# Patient Record
Sex: Male | Born: 1945 | Race: White | Hispanic: No | Marital: Married | Smoking: Former smoker
Health system: Southern US, Community
[De-identification: ages and names within clinical notes are randomized; demographics above are authoritative.]

## PROBLEM LIST (undated history)

## (undated) DIAGNOSIS — I5032 Chronic diastolic (congestive) heart failure: Secondary | ICD-10-CM

## (undated) DIAGNOSIS — R011 Cardiac murmur, unspecified: Secondary | ICD-10-CM

## (undated) DIAGNOSIS — I480 Paroxysmal atrial fibrillation: Secondary | ICD-10-CM

## (undated) DIAGNOSIS — E785 Hyperlipidemia, unspecified: Secondary | ICD-10-CM

## (undated) DIAGNOSIS — I701 Atherosclerosis of renal artery: Secondary | ICD-10-CM

## (undated) DIAGNOSIS — N183 Chronic kidney disease, stage 3 unspecified: Secondary | ICD-10-CM

## (undated) DIAGNOSIS — K567 Ileus, unspecified: Secondary | ICD-10-CM

## (undated) DIAGNOSIS — E118 Type 2 diabetes mellitus with unspecified complications: Secondary | ICD-10-CM

## (undated) DIAGNOSIS — I2510 Atherosclerotic heart disease of native coronary artery without angina pectoris: Secondary | ICD-10-CM

## (undated) DIAGNOSIS — I739 Peripheral vascular disease, unspecified: Secondary | ICD-10-CM

## (undated) DIAGNOSIS — I119 Hypertensive heart disease without heart failure: Secondary | ICD-10-CM

## (undated) HISTORY — DX: Morbid (severe) obesity due to excess calories: E66.01

## (undated) HISTORY — DX: Paroxysmal atrial fibrillation: I48.0

## (undated) HISTORY — DX: Ileus, unspecified: K56.7

## (undated) HISTORY — DX: Hypertensive heart disease without heart failure: I11.9

## (undated) HISTORY — PX: JOINT REPLACEMENT: SHX530

## (undated) HISTORY — DX: Peripheral vascular disease, unspecified: I73.9

## (undated) HISTORY — DX: Cardiac murmur, unspecified: R01.1

## (undated) HISTORY — DX: Atherosclerosis of renal artery: I70.1

---

## 2011-10-31 ENCOUNTER — Ambulatory Visit: Payer: Self-pay | Admitting: Orthopedic Surgery

## 2011-11-25 ENCOUNTER — Ambulatory Visit: Payer: Self-pay | Admitting: Orthopedic Surgery

## 2011-11-25 LAB — URINALYSIS, COMPLETE
Bacteria: NONE SEEN
Bilirubin,UR: NEGATIVE
Blood: NEGATIVE
Glucose,UR: NEGATIVE mg/dL (ref 0–75)
Ketone: NEGATIVE
Protein: 100
RBC,UR: NONE SEEN /HPF (ref 0–5)
Specific Gravity: 1.023 (ref 1.003–1.030)
Squamous Epithelial: NONE SEEN
WBC UR: <1 /HPF (ref 0–5)

## 2011-11-25 LAB — CBC
MCH: 29.6 pg (ref 26.0–34.0)
MCHC: 33.6 g/dL (ref 32.0–36.0)
MCV: 88 fL (ref 80–100)
Platelet: 211 10*3/uL (ref 150–440)
RBC: 4.79 10*6/uL (ref 4.40–5.90)
RDW: 14.6 % — ABNORMAL HIGH (ref 11.5–14.5)
WBC: 11.2 10*3/uL — ABNORMAL HIGH (ref 3.8–10.6)

## 2011-11-25 LAB — PROTIME-INR: INR: 0.9

## 2011-11-25 LAB — BASIC METABOLIC PANEL
Anion Gap: 9 (ref 7–16)
BUN: 26 mg/dL — ABNORMAL HIGH (ref 7–18)
Chloride: 111 mmol/L — ABNORMAL HIGH (ref 98–107)
Creatinine: 1.67 mg/dL — ABNORMAL HIGH (ref 0.60–1.30)
Osmolality: 293 (ref 275–301)
Sodium: 143 mmol/L (ref 136–145)

## 2011-11-25 LAB — MRSA PCR SCREENING

## 2011-11-25 LAB — APTT: Activated PTT: 31.6 secs (ref 23.6–35.9)

## 2011-12-03 ENCOUNTER — Inpatient Hospital Stay: Payer: Self-pay | Admitting: Orthopedic Surgery

## 2011-12-04 LAB — HEMOGLOBIN: HGB: 12.5 g/dL — ABNORMAL LOW (ref 13.0–18.0)

## 2011-12-04 LAB — BASIC METABOLIC PANEL
Anion Gap: 9 (ref 7–16)
BUN: 23 mg/dL — ABNORMAL HIGH (ref 7–18)
Calcium, Total: 8 mg/dL — ABNORMAL LOW (ref 8.5–10.1)
Chloride: 109 mmol/L — ABNORMAL HIGH (ref 98–107)
Co2: 22 mmol/L (ref 21–32)
Creatinine: 1.51 mg/dL — ABNORMAL HIGH (ref 0.60–1.30)
EGFR (African American): 55 — ABNORMAL LOW
EGFR (Non-African Amer.): 47 — ABNORMAL LOW
Glucose: 130 mg/dL — ABNORMAL HIGH (ref 65–99)
Osmolality: 285 (ref 275–301)
Potassium: 4.1 mmol/L (ref 3.5–5.1)
Sodium: 140 mmol/L (ref 136–145)

## 2011-12-04 LAB — PLATELET COUNT: Platelet: 168 10*3/uL (ref 150–440)

## 2012-11-02 ENCOUNTER — Ambulatory Visit: Payer: Self-pay | Admitting: Orthopedic Surgery

## 2012-12-01 ENCOUNTER — Ambulatory Visit: Payer: Self-pay | Admitting: Orthopedic Surgery

## 2012-12-01 LAB — BASIC METABOLIC PANEL
Anion Gap: 5 — ABNORMAL LOW (ref 7–16)
BUN: 24 mg/dL — ABNORMAL HIGH (ref 7–18)
Calcium, Total: 9.3 mg/dL (ref 8.5–10.1)
Chloride: 110 mmol/L — ABNORMAL HIGH (ref 98–107)
Co2: 25 mmol/L (ref 21–32)
Creatinine: 1.76 mg/dL — ABNORMAL HIGH (ref 0.60–1.30)
EGFR (African American): 45 — ABNORMAL LOW
Potassium: 4.3 mmol/L (ref 3.5–5.1)

## 2012-12-01 LAB — URINALYSIS, COMPLETE
Bilirubin,UR: NEGATIVE
Glucose,UR: NEGATIVE mg/dL (ref 0–75)
Leukocyte Esterase: NEGATIVE
Ph: 5 (ref 4.5–8.0)
RBC,UR: NONE SEEN /HPF (ref 0–5)
Specific Gravity: 1.005 (ref 1.003–1.030)
Squamous Epithelial: NONE SEEN
WBC UR: NONE SEEN /HPF (ref 0–5)

## 2012-12-01 LAB — CBC
HCT: 43.7 % (ref 40.0–52.0)
HGB: 14.7 g/dL (ref 13.0–18.0)
MCH: 29 pg (ref 26.0–34.0)
MCHC: 33.6 g/dL (ref 32.0–36.0)
MCV: 87 fL (ref 80–100)
WBC: 11.9 10*3/uL — ABNORMAL HIGH (ref 3.8–10.6)

## 2012-12-01 LAB — SEDIMENTATION RATE: Erythrocyte Sed Rate: 5 mm/hr (ref 0–20)

## 2012-12-01 LAB — APTT: Activated PTT: 31.5 secs (ref 23.6–35.9)

## 2012-12-01 LAB — PROTIME-INR
INR: 0.9
Prothrombin Time: 12.7 secs (ref 11.5–14.7)

## 2012-12-15 ENCOUNTER — Inpatient Hospital Stay: Payer: Self-pay | Admitting: Orthopedic Surgery

## 2012-12-16 LAB — BASIC METABOLIC PANEL
Anion Gap: 5 — ABNORMAL LOW (ref 7–16)
BUN: 24 mg/dL — ABNORMAL HIGH (ref 7–18)
Calcium, Total: 8.2 mg/dL — ABNORMAL LOW (ref 8.5–10.1)
Chloride: 108 mmol/L — ABNORMAL HIGH (ref 98–107)
Co2: 24 mmol/L (ref 21–32)
EGFR (Non-African Amer.): 40 — ABNORMAL LOW
Osmolality: 280 (ref 275–301)
Potassium: 4.2 mmol/L (ref 3.5–5.1)
Sodium: 137 mmol/L (ref 136–145)

## 2012-12-16 LAB — HEMOGLOBIN: HGB: 13 g/dL (ref 13.0–18.0)

## 2012-12-16 LAB — PLATELET COUNT: Platelet: 165 10*3/uL (ref 150–440)

## 2012-12-17 LAB — BASIC METABOLIC PANEL
Anion Gap: 5 — ABNORMAL LOW (ref 7–16)
BUN: 20 mg/dL — ABNORMAL HIGH (ref 7–18)
Chloride: 109 mmol/L — ABNORMAL HIGH (ref 98–107)
Creatinine: 1.77 mg/dL — ABNORMAL HIGH (ref 0.60–1.30)
EGFR (African American): 45 — ABNORMAL LOW
EGFR (Non-African Amer.): 39 — ABNORMAL LOW
Osmolality: 281 (ref 275–301)
Potassium: 3.8 mmol/L (ref 3.5–5.1)

## 2012-12-17 LAB — PATHOLOGY REPORT

## 2013-03-05 ENCOUNTER — Ambulatory Visit: Payer: Self-pay | Admitting: Orthopedic Surgery

## 2014-05-11 IMAGING — CT CT OF THE RIGHT KNEE WITHOUT CONTRAST
1 of 3 series · 8 of 14 positions shown, 10 images · non-contrast
Comparison: none

REASON FOR EXAM: my knee  RT KNEE PAIN PREOP PLANNING
COMMENTS:

[Series 3: knee · axial · 0.39mm/px · z∈[-46,+168]mm · 8 of 553 slices shown, 10 images]
[im 62/553  soft-tissue]
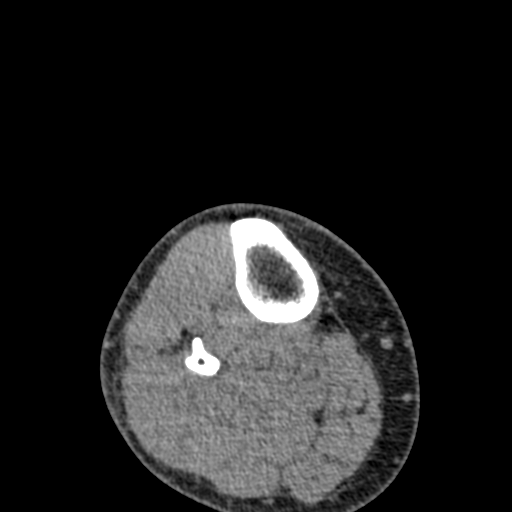
[im 62/553  bone]
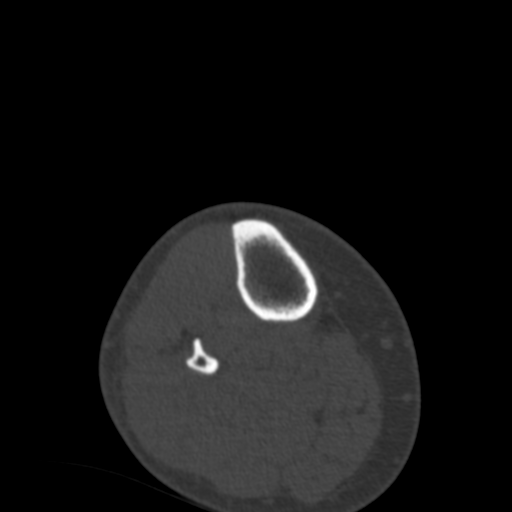
[im 123/553  bone]
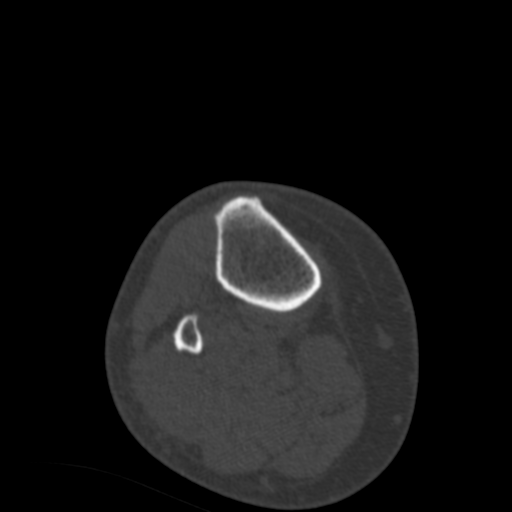
[im 185/553  bone]
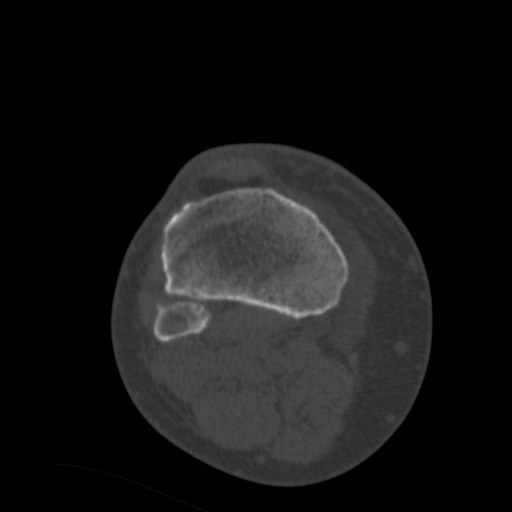
[im 246/553  bone]
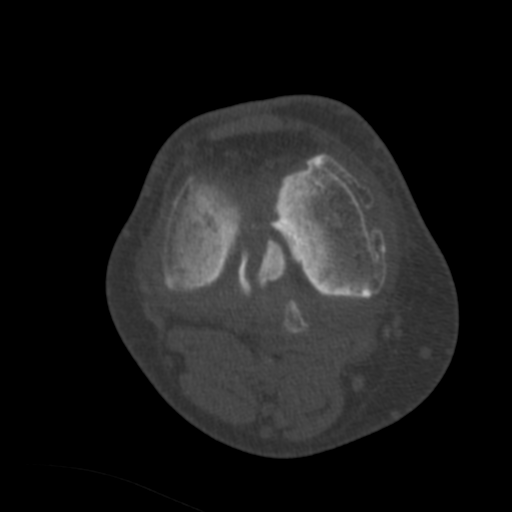
[im 307/553  soft-tissue]
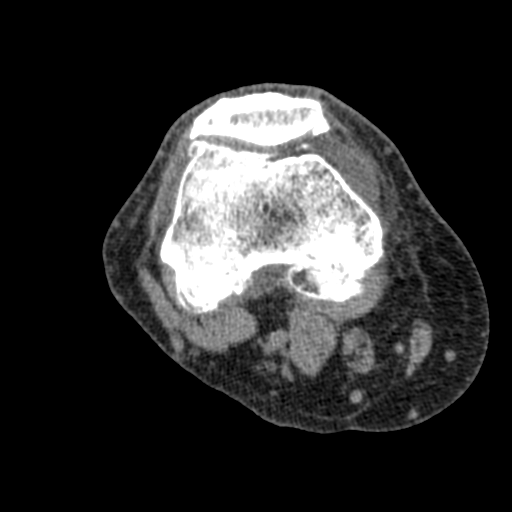
[im 307/553  bone]
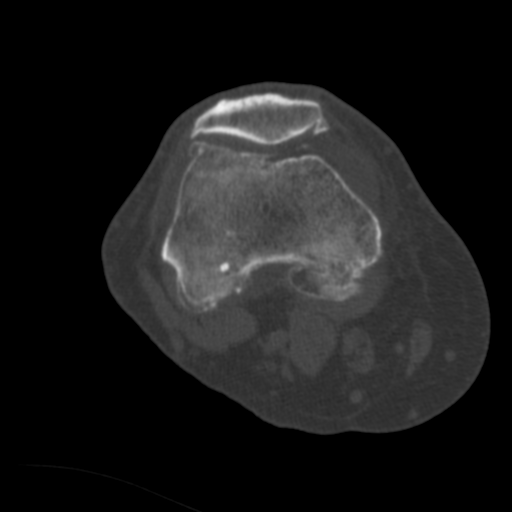
[im 369/553  bone]
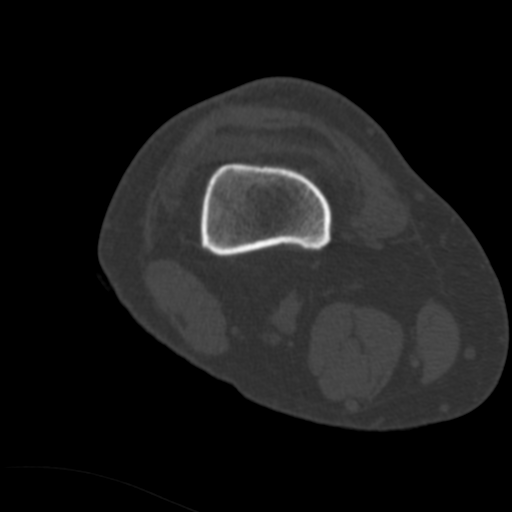
[im 430/553  bone]
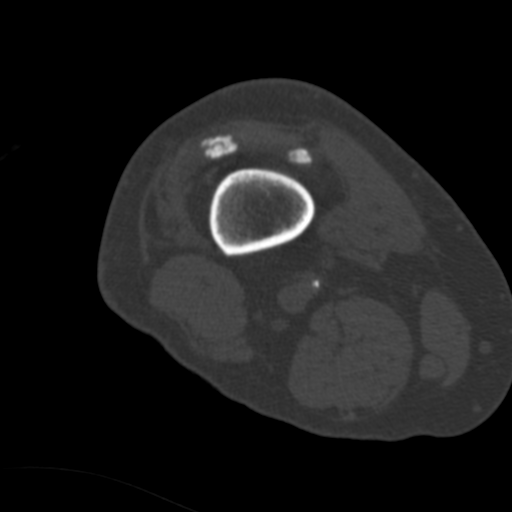
[im 491/553  bone]
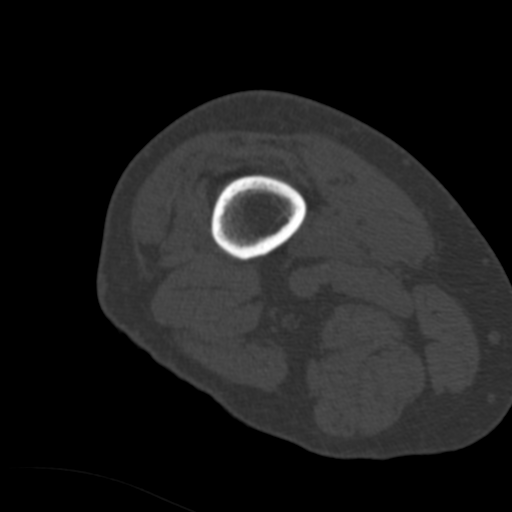

[8 of 14 positions shown; findings below may reference images not displayed]

PROCEDURE:     CT  - CT KNEE RIGHT WO  - October 31, 2011  [DATE]

RESULT:     Multislice helical acquisition through the right hip, right knee
and right ankle are obtained prior to right knee arthroplasty. Images are
reconstructed at 2.0 mm slice thickness in the axial plane at bone window
settings through the right hip and right ankle and at 1 mm slice thickness
through the right knee.

There are severe degenerative changes in the right knee with joint space
narrowing, some calcification in the patellar retinaculum and degenerative
changes of osteoarthritis in the joint and margins of the bony structures
about the joint.

Images of the right hip show no bony destruction, fracture or dislocation.
Images through the right foot and ankle show no significant bony abnormality.
IMPRESSION: Degenerative changes about the right knee.

[REDACTED]

## 2014-07-05 NOTE — Op Note (Signed)
PATIENT NAME:  James ChimesUNDERWOOD, Liston MR#:  [id number] DATE OF BIRTH:  [birth year]  DATE OF PROCEDURE:  12/03/2011  PREOPERATIVE DIAGNOSIS: Right knee severe osteoarthritis.  POSTOPERATIVE DIAGNOSIS: Right knee severe osteoarthritis.   PROCEDURE: Right total knee replacement.   ANESTHESIA: Spinal.   SURGEON: Leitha SchullerMichael J. Chibuikem Thang, MD   ASSISTANT: Amador Cunashomas Gaines, PA-C   ASSISTANT: April Burns, Nurse Practitioner  DESCRIPTION OF PROCEDURE: The patient was brought to the operating room, and after adequate anesthesia was obtained the right leg was prepped and draped in sterile fashion and a tourniquet applied to the upper thigh. An Alvarado legholder was also utilized. After appropriate patient identification and time out procedures were completed, and having prepped and draped in the usual sterile manner, the leg was exsanguinated with an Esmarch and the tourniquet raised to 300 mmHg. A midline skin incision was made, followed by a medial parapatellar arthrotomy. Inspection revealed multiple loose bodies in the suprapatellar pouch and eburnated bone in the medial compartment on both femoral and tibial sides, several millimeters of bone loss. The lateral compartment was relatively spared, and the patellofemoral joint had extensive erosive changes, as did the trochlea. The ACL and suprapatellar fat pad were excised and the proximal tibia exposed to allow for application of the proximal tibial cutting block. The proximal tibial cut was carried out and the bone removed while the anterior horns of the medial and lateral meniscus, the residual horns, were later resected after removing the femoral cuts. A  femoral cutting block was applied and drill holes placed to hold it in position. The distal femoral cut was carried out, a 5 cutting block applied, and anterior and posterior chamfer cuts made without any notching. Trial components were then placed with a 4 tibia, 5 femur, and a 10 mm insert. There was a little bit of  laxity in mid flexion and extension, so the 12 mm insert was placed and this gave excellent stability in mid flexion and extension. The notch cut was made into the proximal tibia for the keel, and the distal femoral drill holes were at this time along with the notch and the trochlear groove . The   patella was cut with the guide, it measured a size 3 after drill holes were made. All the bony surfaces were thoroughly irrigated and dried. The cement was inserted and the tibial components cemented into place first with excess cement removed, followed by the 12 mm standard polyethylene insert, a 5 femur, a 3 patella. After the cement had set, excess cement was removed and the knee thoroughly irrigated. The tourniquet was let down. The wound was closed with a heavy quill suture for the capsule, 2-0 quill subcutaneously and skin staples. Xeroform, 4 x 4's, ABD, Webril, Polar Care and base were applied, along with a knee immobilizer and transferred to recovery room in stable  condition.   ESTIMATED BLOOD LOSS: 100 mL.   COMPLICATIONS: None.  TOURNIQUET TIME: 72 minutes at 300 mmHg.   IMPLANTS: Medacta GMK Primary total knee system, size 5 standard femur, size 4 fixed  tibial baseplate with a 12 mm standard insert, and a size 3 patella. All components were cemented.    COMPLICATIONS: None.   CONDITION: Condition to the recovery room  was stable.  ____________________________ Leitha SchullerMichael J. Dezirea Mccollister, MD mjm:cbb D: 12/03/2011 10:26:30 ET T: 12/03/2011 11:26:17 ET JOB#: [id number]  cc: Leitha SchullerMichael J. Bertice Risse, MD, <Dictator> Leitha SchullerMICHAEL J Quinita Kostelecky MD ELECTRONICALLY SIGNED 12/03/2011 12:46

## 2014-07-05 NOTE — Discharge Summary (Signed)
PATIENT NAME:  James Clayton, PAFF MR#:  [id number] DATE OF BIRTH:  January 17, 1946  DATE OF ADMISSION:  12/03/2011 DATE OF DISCHARGE:  12/06/2011  ADMITTING DIAGNOSIS: Right knee severe osteoarthritis.   DISCHARGE DIAGNOSIS: Right knee severe osteoarthritis.   PROCEDURE: Right total knee replacement.   ANESTHESIA: Spinal.   SURGEON: Leitha Schuller, M.D.   ASSISTANT: Amador Cunas, PA-C   ASSISTANT:  April Burns, NP   ESTIMATED BLOOD LOSS: 100 mL.   COMPLICATIONS: None.   TOURNIQUET TIME: 72 minutes at 300 mmHg.  IMPLANTS:  Medacta GMK primary total knee system, size five standard femur, size four fixed tibial base plate with a 04-VW standard insert, and a size three patella. All components were cemented.   COMPLICATIONS: None.   CONDITION: To recovery room stable.   HISTORY: The patient is a 69 year old [place], [place]  previously had seen. He has severe osteoarthritis of the right knee, loose bodies, complete loss of the medial and patellofemoral joint space. He has lived with this for many years and has exhausted nonoperative treatment including corticosteroid injections and Synvisc injections. He has debilitating pain and cannot get through a grocery store because of this. It has markedly limited his activities.   PHYSICAL EXAMINATION: HEAD: Remarkable for upper dentures, edentulous with an upper plate. LUNGS: Clear to auscultation. HEART: Regular rate and rhythm. NECK: Normal. EXTREMITIES: Knee- He has varus deformity that is passively correctable. Range of motion is 5 to 115 degrees with marked crepitation and minimal swelling. He does have some trace edema in both lower extremities. He is able to flex and extend the toes. He has intact sensation and palpable dorsalis pedis pulse.   HOSPITAL COURSE: The patient was admitted to the hospital on 12/03/2011. He had surgery that same day and was brought to the orthopedic floor from the PAC-U in stable condition. The patient's labs and  vital signs were monitored and he was given physical therapy twice daily. The patient's labs and vital signs remained stable and the patient progressed well with physical therapy. On 12/06/2011 the patient was stable and ready for discharge home with Home Health physical therapy.   CONDITION AT DISCHARGE: Stable.   DISCHARGE INSTRUCTIONS:  1. The patient may gradually increase weight-bearing on the affected extremity.  2. Elevate the affected foot and leg on 1 or 2 pillows with the foot higher than the knee.  3. Knee-high TED hose on both legs and remove at bedtime. Replace when arising the next morning.  4. The patient may resume a regular diet as tolerated.  5. Xarelto 10 mg every morning until finished.  6. Pain medications:  Tylenol 650 to 1000 mg every six hours as needed for pain. Oxycodone 5 to 10 mg every four hours as needed for pain.   WOUND CARE: Continue using Polar Care unit maintaining temperature between 40 and 50 degrees. Do not get the dressing  bandage wet or dirty. Call [facility] orthopedics if the dressing gets water under it. Leave the dressing on.   SYMPTOMS TO REPORT: Call [facility] orthopedics if any of the following occur: Bright red bleeding from the incision wound, fever above 101.5 degrees, redness, swelling, or drainage at the incision. Call [facility] orthopedics if you experience any increased leg pain, numbness or weakness in your legs, or bowel or bladder symptoms.   REFERRALS: Home physical therapy has been arranged for continuation of rehab program. Please call [facility] orthopedics if a therapist has not contacted you within 48 hours of  your return home.   FOLLOWUP APPOINTMENT: Call [facility] for follow-up appointment.  The patient needs to be seen in two weeks.   DISCHARGE MEDICATIONS:  1. Amlodipine 10 mg oral tablet 1 tablet oral once daily.  2. Losartan 50 mg oral tablet 1 tablet orally 2 times a day.   3. PreserVision oral tablet 1 tablet orally once a day.  4. Vicodin 500/7.5 mg oral tablet 1 tablet orally 3 times a day as needed.  5. Allopurinol 100 mg oral tablet 2 tabs orally once a day.  6. Fish oil 1000 mg oral capsule one cap orally 2 times a day.  7. Atorvastatin 20 mg oral tablet 1 tablet orally once a day at bedtime.  8. Tradjenta 5 mg oral tablet 1 tablet orally once a day at the evening meal.    ____________________________ Evon Slackhomas C. Ridgely Anastacio, PA-C tcg:bjt D: 12/10/2011 13:19:25 ET T: 12/12/2011 11:00:43 ET JOB#: [id number]  cc: Evon Slackhomas C. Lutie Pickler, PA-C, <Dictator> Evon SlackHOMAS C Zaidy Absher [place]PA ELECTRONICALLY SIGNED 12/17/2011 13:49

## 2014-07-08 NOTE — Discharge Summary (Signed)
PATIENT NAME:  James ChimesUNDERWOOD, Jie MR#:  [id number] DATE OF BIRTH:  1945/07/05  DATE OF ADMISSION:  12/15/2012 DATE OF DISCHARGE:  12/18/2012   ADMITTING DIAGNOSIS: Left knee degenerative osteoarthritis.   DISCHARGE DIAGNOSIS: Left knee degenerative osteoarthritis.   PROCEDURE: Left total knee replacement.   ANESTHESIA: Spinal.   SURGEON: Kennedy BuckerMichael Menz, MD  ASSISTANT: [place]ston Neighborhris Gaines, PA-C   ESTIMATED BLOOD LOSS: 50 mL.   COMPLICATIONS: None.   SPECIMEN: Cut ends of bone.   TOURNIQUET TIME: 54 minutes at 300 mmHg.   IMPLANTS:  Medacta GMK Sphere left 5 cemented, left fixed 4 tibial component with a size 4 left 10 mm flex component and a 3 patella, all components cemented.   CONDITION: To recovery room stable.   HISTORY: The patient is a 69 year old who underwent a successful right total knee replacement on 12/03/2011. He was doing well and denies any complaints. He has been pleased with the right surgery. The left knee has been aggravating him and causing him pain and discomfort. It has been giving out on him. The patient had a recent My knee CT of his left knee that showed significant degenerative arthritis. He continues to have moderate to severe pain in the medial aspect of the left knee despite cortisone injections as well as Synvisc injections and oral anti-inflammatory medications. The patient is taking hydrocodone 7.5/325 mg tablets for his left knee pain.   PHYSICAL EXAMINATION:  GENERAL: Well developed, well-nourished male in no apparent distress. Normal affect. Normal gait with mild antalgic component of left lower extremity.  HEENT: Head normocephalic, atraumatic. Pupils equal, round and reactive to light.  HEART: Regular rate and rhythm. LUNGS: Clear to auscultation bilaterally. No wheezing, rales or rhonchi.  EXTREMITIES: Right knee: The patient has full range of motion with flexion and extension of 0 to 120 degrees. There is no laxity with varus or valgus stress testing. He  has a negative Homans sign. Incision is completely healed. He has no tenderness on exam. Left knee shows the patient has mild swelling. No warmth, erythema or effusion noted. He has 0 to 115 degrees range of motion. There is no laxity on exam. He has a negative Homans sign.   HOSPITAL COURSE: The patient was admitted to the hospital on 12/15/2012. He had surgery that same day and was brought to the orthopedic floor from the PACU in stable condition. On postoperative day 1, the patient's vital signs and labs were stable. He had good progress with physical therapy, and throughout postoperative day 2 and postoperative day 3, the patient continued to progress very well with physical therapy, and by postoperative day 3, he was ambulating 200 feet as well as able to do stairs. The patient had a bowel movement and was stable and ready for discharge on 12/18/2012.   CONDITION AT DISCHARGE: Stable.   DISCHARGE INSTRUCTIONS:  1. The patient may gradually increase weight-bearing on the affected extremity.  2. He needs to elevate the affected foot or leg on 1 or 2 pillows with the foot higher than the knee.  3. Thigh-high TED hose on both legs and remove at bedtime. Replace when arising the next morning.  4. Elevate the heels off the bed.  5. Use incentive spirometry every hour while awake. Encourage cough and deep breathing.  6. No concentrated sweets or sugar.  7. Continue using Polar Care unit, maintaining a temperature between 40 and 50 degrees. 8. Do not get the dressing or bandage wet or dirty. Call [facility] if the  dressing gets water under it. Leave the dressing on.  9. Call [facility] if any of the following occur: Bright red bleeding from the incision or wound, fever above 101.5 degrees, redness, swelling or drainage at the incision. Call [facility] if you experience any increased leg pain, numbness or weakness in your legs or bowel or bladder symptoms.  10. Home physical therapy has been  arranged for continuation of rehab program. Please call [facility] if a therapist has not contacted you within 48 hours of your return home.  11. The patient has a followup appointment with [facility] in 2 weeks.   DISCHARGE MEDICATIONS:  1. Losartan 50 mg 1 tablet orally 2 times a day.  2. Allopurinol 100 mg tablet 2 tabs orally once a day.  3. Atorvastatin 20 mg oral tablet 1 tablet orally once day at bedtime.  4. Glipizide extended-release 2.5 mg oral tablet extended release 1 tablet orally once a day. 5. Hytrin 1 tablet orally once a day at bedtime. 6. Lasix 20 mg oral tablet 1 tablet orally once a day as needed.  7. Amlodipine 5 mg oral tablet 1 tablet orally 2 times a day.  8. Aspirin 81 mg 1 tablet orally once a day. 9. Tylenol 500 mg 1 tablet orally every 4 hours as needed for pain or temperature greater than 100.4.  10. Oxycodone 5 mg oral tablet 1 tablet orally every 4 hours as needed for pain. 11. Magnesium hydroxide 8% oral suspension 30 mL orally 2 times a day as needed for constipation. 12. Xarelto 10 mg oral tablet 1 tablet orally once a day in the morning for 9 days.  13. Bisacodyl 10 mg rectal suppository 1 suppository rectally once a day as needed for constipation.   ____________________________ T. [place] Neighbor, PA-C tcg:OSi D: 12/18/2012 08:12:32 ET T: 12/18/2012 08:28:06 ET JOB#: [id number]  cc: T. [place] Neighbor, PA-C, <Dictator> Evon Slack [place] ELECTRONICALLY SIGNED 12/23/2012 9:05

## 2014-07-08 NOTE — Op Note (Signed)
PATIENT NAME:  James Clayton, James Clayton MR#:  [id number] DATE OF BIRTH:  [birth year]  DATE OF PROCEDURE:  12/15/2012  PREOPERATIVE DIAGNOSIS: Left knee degenerative osteoarthritis.   POSTOPERATIVE DIAGNOSIS:  Left knee degenerative osteoarthritis.  PROCEDURE: Left total knee replacement.   ANESTHESIA: Spinal.   SURGEON: Kennedy Bucker, M.D.   ASSISTANT:  [place] Neighbor, PA-C.   DESCRIPTION OF PROCEDURE: The patient was brought to the operating room and after adequate anesthesia was obtained, the left leg was prepped and draped in sterile fashion with a tourniquet applied to the upper thigh. After patient identification, timeout procedures were completed, the leg was exsanguinated with the use of an Esmarch and the tourniquet raised to 300 mmHg. A midline skin incision was made followed by a medial parapatellar arthrotomy. Inspection revealed sclerotic bone on both tibial and femoral sides on the medial compartment as well as severe degenerative changes with bone exposed on the patella. The lateral compartment had extensive wear in the weight-bearing portion of the more medial aspect of the condyle on the femoral side. The ACL and fat pad were excised and the proximal tibia is exposed to allow for application of the Medacta cutting block. The tibial cutting block was placed and alignment checked. The proximal tibia cut was then carried out with the bone removed and it matched the preop template. Next, the femur was exposed in a similar fashion, removing articular cartilage to get the cutting block to the appropriate position then the distal cut was then made. The size 5 cutting block was applied. Anterior, posterior and chamfer cuts created.  Trial components were placed with a 4 tibia and a 5 femur and 10 mm insert. This gave full extension and excellent stability through range of motion. The distal femoral drill holes were made and the distal femur femoral component removed. The cutting guide for the notch cut for  the trochlear groove was then placed and this cut was carried out. Prior to removal of the trial, the patella was noted to track very well. The patella was addressed at this point, with the use of the cutting guide. A 10 mm resection off the patella was performed, drill guide placed and 3 drill holes made for the resurfacing implant.  A size 3 patella gave good coverage. All trials were removed at this point, The PCL had been removed with the proximal tibia and residual menisci also removed. Tourniquet was let down to check for hemostasis and the joint was infiltrated with local anesthetic, 0.5% Sensorcaine and morphine in the periarticular tissue along with Exparel diluted with saline. Following this, the tourniquet was raised again. The bony surfaces thoroughly irrigated and dried. The tibial component was cemented into place first, followed by the femoral component with the tibial polyethylene insert placed and facet screw also tightened. The knee is held in extension and the patellar button was clamped into place with excess cement being removed. After the cement had set, excess cement in the notch was removed and the knee thoroughly irrigated. The patella tracked well with no touch technique. The tourniquet was let down and the arthrotomy repaired using a heavy quill suture, 2-0 Vicryl subcutaneously and skin staples. Xeroform, 4 x 4's, ABD, Webril, Polar Care and Ace wrap applied. The patient was sent to the recovery room in stable condition.   ESTIMATED BLOOD LOSS: 50 mL.   COMPLICATIONS: None.   SPECIMEN: Cut ends of bone.   TOURNIQUET TIME: 54 minutes at 300 mmHg.   IMPLANTS: Medacta GMK sphere left 5  cemented, left fixed 4 tibial component with a size 4 left 10 mm flex component and a 3 patella. All components cemented.   CONDITION: To recovery room stable.    ____________________________ Leitha SchullerMichael J. Galya Dunnigan, MD mjm:dp D: 12/15/2012 15:16:53 ET T: 12/15/2012 15:55:41  ET JOB#: [id number]  cc: Leitha SchullerMichael J. Cheryl Stabenow, MD, <Dictator> Leitha SchullerMICHAEL J Fredric Slabach MD ELECTRONICALLY SIGNED 12/15/2012 17:25

## 2014-07-14 ENCOUNTER — Encounter (HOSPITAL_COMMUNITY): Payer: Self-pay

## 2015-05-14 IMAGING — CT CT OF THE LEFT KNEE WITHOUT CONTRAST
1 of 3 series · 8 of 14 positions shown, 10 images · non-contrast
Comparison: none

REASON FOR EXAM: MY KNEE  surgery planning lt knee pain
COMMENTS:

[Series 3: knee (id) smooth · axial · 0.39mm/px · z∈[+432,+644]mm · 8 of 545 slices shown, 10 images]
[im 61/545  soft-tissue]
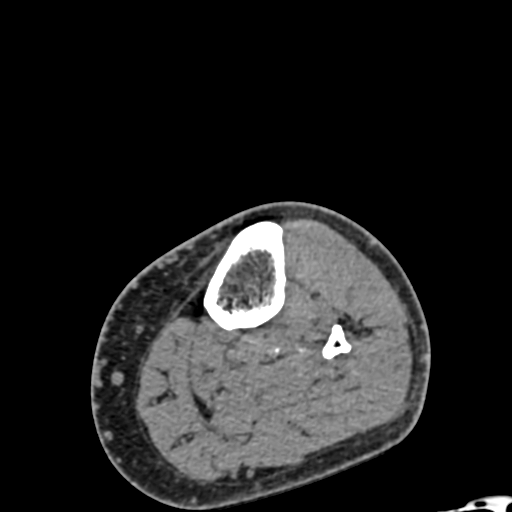
[im 61/545  bone]
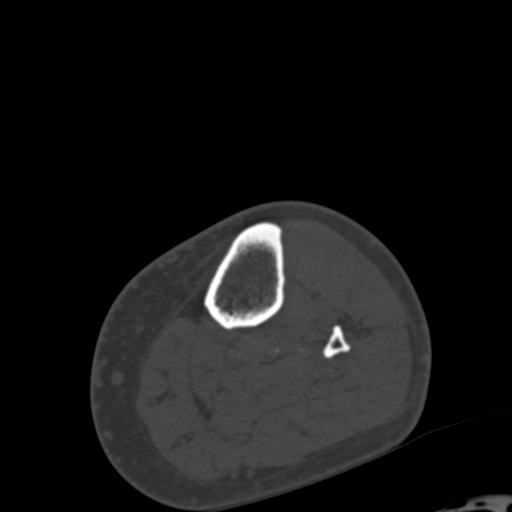
[im 121/545  bone]
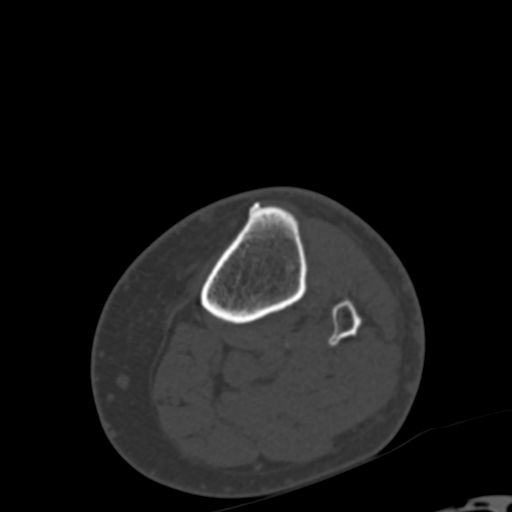
[im 182/545  bone]
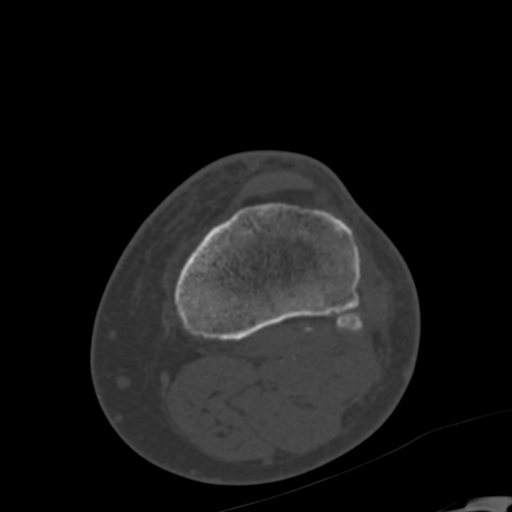
[im 242/545  bone]
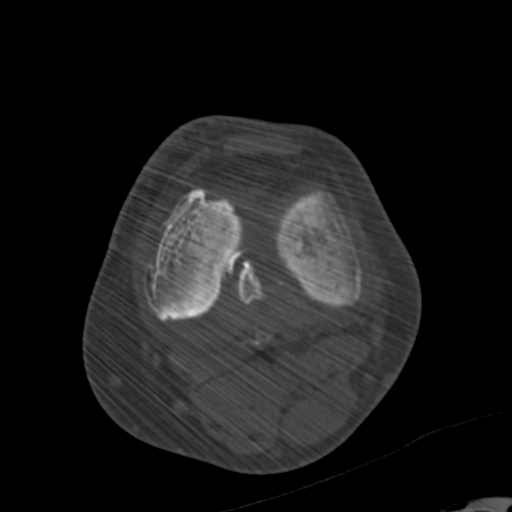
[im 303/545  soft-tissue]
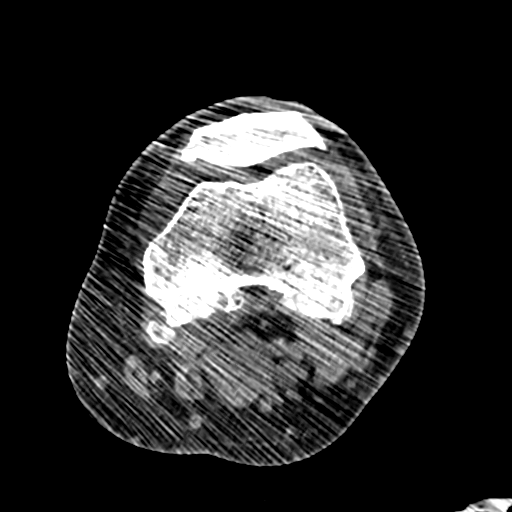
[im 303/545  bone]
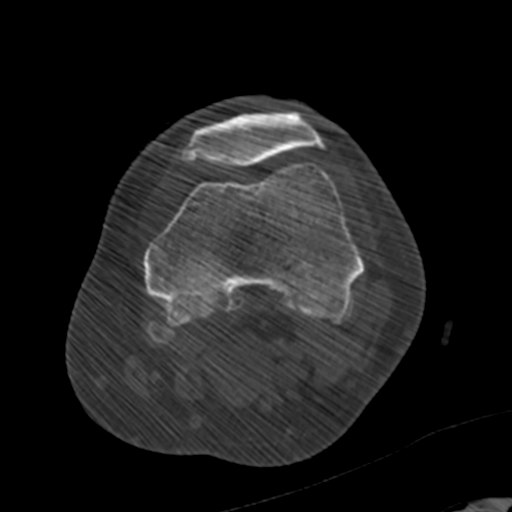
[im 363/545  bone]
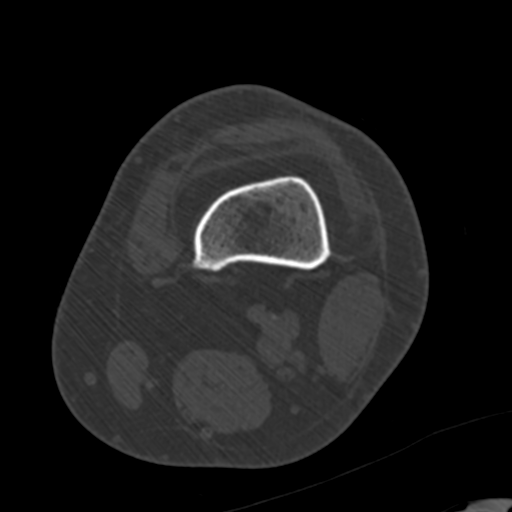
[im 424/545  bone]
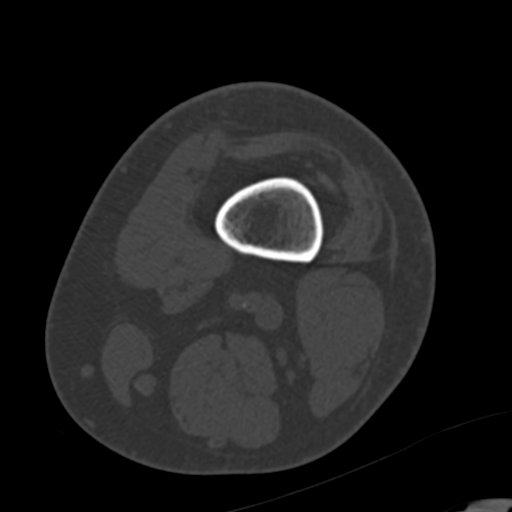
[im 484/545  bone]
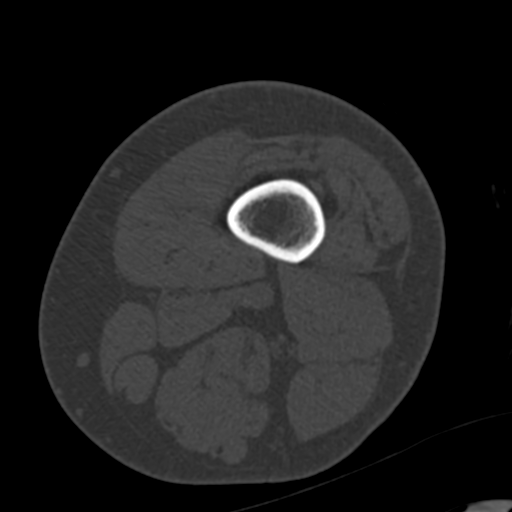

[8 of 14 positions shown; findings below may reference images not displayed]

PROCEDURE:     CT  - CT KNEE LEFT WO  - November 02, 2012  [DATE]

RESULT:     Multislice helical acquisition with bone window reconstructions
in the axial plane is performed through the left hip, left knee and left
ankle. Ankle and hip images are reconstructed at 2 mm slice thickness while
the knee images are reconstructed at 1 mm slice thickness.

There is significant medial compartmental narrowing with subchondral
sclerosis. No fracture or bony mass is evident. Patellofemoral degenerative
changes are present.

Images through the left hip show no fracture, dislocation or focal bony
lesion. The left ankle likewise shows some chronic degenerative change
without acute bony abnormality. Intertarsal degenerative changes are also
present.
IMPRESSION: Multifocal degenerative changes as described.

[REDACTED]

## 2015-12-22 DIAGNOSIS — Z87891 Personal history of nicotine dependence: Secondary | ICD-10-CM | POA: Insufficient documentation

## 2015-12-22 DIAGNOSIS — K219 Gastro-esophageal reflux disease without esophagitis: Secondary | ICD-10-CM | POA: Insufficient documentation

## 2015-12-22 DIAGNOSIS — M109 Gout, unspecified: Secondary | ICD-10-CM | POA: Insufficient documentation

## 2015-12-22 DIAGNOSIS — M179 Osteoarthritis of knee, unspecified: Secondary | ICD-10-CM | POA: Insufficient documentation

## 2015-12-22 DIAGNOSIS — N529 Male erectile dysfunction, unspecified: Secondary | ICD-10-CM | POA: Insufficient documentation

## 2015-12-22 DIAGNOSIS — M1710 Unilateral primary osteoarthritis, unspecified knee: Secondary | ICD-10-CM | POA: Insufficient documentation

## 2015-12-22 DIAGNOSIS — R361 Hematospermia: Secondary | ICD-10-CM | POA: Insufficient documentation

## 2015-12-22 DIAGNOSIS — M2140 Flat foot [pes planus] (acquired), unspecified foot: Secondary | ICD-10-CM | POA: Insufficient documentation

## 2015-12-22 DIAGNOSIS — M79676 Pain in unspecified toe(s): Secondary | ICD-10-CM | POA: Insufficient documentation

## 2015-12-22 DIAGNOSIS — E7800 Pure hypercholesterolemia, unspecified: Secondary | ICD-10-CM | POA: Insufficient documentation

## 2016-02-09 ENCOUNTER — Inpatient Hospital Stay
Admission: EM | Admit: 2016-02-09 | Discharge: 2016-02-12 | DRG: 281 | Disposition: A | Discharge status: Other Acute Inpatient Hospital | Payer: Medicare Other | Attending: Internal Medicine | Admitting: Internal Medicine

## 2016-02-09 ENCOUNTER — Encounter: Payer: Self-pay | Admitting: Emergency Medicine

## 2016-02-09 ENCOUNTER — Emergency Department: Payer: Medicare Other

## 2016-02-09 DIAGNOSIS — Z7984 Long term (current) use of oral hypoglycemic drugs: Secondary | ICD-10-CM | POA: Diagnosis not present

## 2016-02-09 DIAGNOSIS — I2510 Atherosclerotic heart disease of native coronary artery without angina pectoris: Secondary | ICD-10-CM | POA: Diagnosis present

## 2016-02-09 DIAGNOSIS — I10 Essential (primary) hypertension: Secondary | ICD-10-CM | POA: Diagnosis present

## 2016-02-09 DIAGNOSIS — Z87891 Personal history of nicotine dependence: Secondary | ICD-10-CM | POA: Diagnosis not present

## 2016-02-09 DIAGNOSIS — E785 Hyperlipidemia, unspecified: Secondary | ICD-10-CM | POA: Diagnosis present

## 2016-02-09 DIAGNOSIS — E669 Obesity, unspecified: Secondary | ICD-10-CM | POA: Diagnosis present

## 2016-02-09 DIAGNOSIS — Z951 Presence of aortocoronary bypass graft: Secondary | ICD-10-CM

## 2016-02-09 DIAGNOSIS — I5032 Chronic diastolic (congestive) heart failure: Secondary | ICD-10-CM | POA: Diagnosis present

## 2016-02-09 DIAGNOSIS — I25110 Atherosclerotic heart disease of native coronary artery with unstable angina pectoris: Secondary | ICD-10-CM | POA: Diagnosis not present

## 2016-02-09 DIAGNOSIS — I130 Hypertensive heart and chronic kidney disease with heart failure and stage 1 through stage 4 chronic kidney disease, or unspecified chronic kidney disease: Secondary | ICD-10-CM | POA: Diagnosis present

## 2016-02-09 DIAGNOSIS — Z01810 Encounter for preprocedural cardiovascular examination: Secondary | ICD-10-CM | POA: Diagnosis not present

## 2016-02-09 DIAGNOSIS — Z7982 Long term (current) use of aspirin: Secondary | ICD-10-CM

## 2016-02-09 DIAGNOSIS — R079 Chest pain, unspecified: Secondary | ICD-10-CM

## 2016-02-09 DIAGNOSIS — I214 Non-ST elevation (NSTEMI) myocardial infarction: Principal | ICD-10-CM | POA: Diagnosis present

## 2016-02-09 DIAGNOSIS — E1122 Type 2 diabetes mellitus with diabetic chronic kidney disease: Secondary | ICD-10-CM | POA: Diagnosis present

## 2016-02-09 DIAGNOSIS — Z8249 Family history of ischemic heart disease and other diseases of the circulatory system: Secondary | ICD-10-CM | POA: Diagnosis not present

## 2016-02-09 DIAGNOSIS — Z6832 Body mass index (BMI) 32.0-32.9, adult: Secondary | ICD-10-CM | POA: Diagnosis not present

## 2016-02-09 DIAGNOSIS — Z79899 Other long term (current) drug therapy: Secondary | ICD-10-CM | POA: Diagnosis not present

## 2016-02-09 DIAGNOSIS — I480 Paroxysmal atrial fibrillation: Secondary | ICD-10-CM | POA: Diagnosis not present

## 2016-02-09 DIAGNOSIS — N183 Chronic kidney disease, stage 3 (moderate): Secondary | ICD-10-CM | POA: Diagnosis present

## 2016-02-09 LAB — BASIC METABOLIC PANEL
Anion gap: 7 (ref 5–15)
Anion gap: 7 (ref 5–15)
BUN: 40 mg/dL — ABNORMAL HIGH (ref 6–20)
BUN: 41 mg/dL — ABNORMAL HIGH (ref 6–20)
CALCIUM: 9.3 mg/dL (ref 8.9–10.3)
CHLORIDE: 112 mmol/L — AB (ref 101–111)
CO2: 26 mmol/L (ref 22–32)
CO2: 23 mmol/L (ref 22–32)
CREATININE: 2.04 mg/dL — AB (ref 0.61–1.24)
Calcium: 9.5 mg/dL (ref 8.9–10.3)
Chloride: 108 mmol/L (ref 101–111)
Creatinine, Ser: 1.95 mg/dL — ABNORMAL HIGH (ref 0.61–1.24)
GFR calc non Af Amer: 31 mL/min — ABNORMAL LOW (ref >60)
GFR calc non Af Amer: 33 mL/min — ABNORMAL LOW (ref >60)
GFR, EST AFRICAN AMERICAN: 36 mL/min — AB (ref >60)
GFR, EST AFRICAN AMERICAN: 38 mL/min — AB (ref >60)
Glucose, Bld: 215 mg/dL — ABNORMAL HIGH (ref 65–99)
Glucose, Bld: 193 mg/dL — ABNORMAL HIGH (ref 65–99)
POTASSIUM: 4 mmol/L (ref 3.5–5.1)
Potassium: 4 mmol/L (ref 3.5–5.1)
SODIUM: 141 mmol/L (ref 135–145)
SODIUM: 142 mmol/L (ref 135–145)

## 2016-02-09 LAB — CBC
HCT: 42.3 % (ref 40.0–52.0)
HCT: 40 % (ref 40.0–52.0)
HEMOGLOBIN: 13.4 g/dL (ref 13.0–18.0)
Hemoglobin: 14.2 g/dL (ref 13.0–18.0)
MCH: 29.7 pg (ref 26.0–34.0)
MCH: 29.7 pg (ref 26.0–34.0)
MCHC: 33.5 g/dL (ref 32.0–36.0)
MCHC: 33.4 g/dL (ref 32.0–36.0)
MCV: 88.7 fL (ref 80.0–100.0)
MCV: 88.9 fL (ref 80.0–100.0)
PLATELETS: 182 10*3/uL (ref 150–440)
PLATELETS: 172 10*3/uL (ref 150–440)
RBC: 4.77 MIL/uL (ref 4.40–5.90)
RBC: 4.5 MIL/uL (ref 4.40–5.90)
RDW: 14.9 % — AB (ref 11.5–14.5)
RDW: 15 % — ABNORMAL HIGH (ref 11.5–14.5)
WBC: 11.7 10*3/uL — AB (ref 3.8–10.6)
WBC: 12.9 10*3/uL — ABNORMAL HIGH (ref 3.8–10.6)

## 2016-02-09 LAB — LIPID PANEL
CHOL/HDL RATIO: 5.2 ratio
CHOLESTEROL: 120 mg/dL (ref 0–200)
HDL: 23 mg/dL — ABNORMAL LOW (ref >40)
LDL Cholesterol: 42 mg/dL (ref 0–99)
TRIGLYCERIDES: 273 mg/dL — AB (ref <150)
VLDL: 55 mg/dL — AB (ref 0–40)

## 2016-02-09 LAB — TROPONIN I
TROPONIN I: 0.28 ng/mL — AB (ref <0.03)
Troponin I: 2.81 ng/mL (ref <0.03)
Troponin I: 6.16 ng/mL (ref <0.03)
Troponin I: 4.29 ng/mL (ref <0.03)

## 2016-02-09 LAB — GLUCOSE, CAPILLARY
GLUCOSE-CAPILLARY: 169 mg/dL — AB (ref 65–99)
GLUCOSE-CAPILLARY: 122 mg/dL — AB (ref 65–99)
Glucose-Capillary: 107 mg/dL — ABNORMAL HIGH (ref 65–99)

## 2016-02-09 LAB — APTT: aPTT: 31 seconds (ref 24–36)

## 2016-02-09 LAB — PROTIME-INR
INR: 1.02
Prothrombin Time: 13.4 seconds (ref 11.4–15.2)

## 2016-02-09 LAB — HEPARIN LEVEL (UNFRACTIONATED)
HEPARIN UNFRACTIONATED: 0.24 [IU]/mL — AB (ref 0.30–0.70)
HEPARIN UNFRACTIONATED: 0.33 [IU]/mL (ref 0.30–0.70)

## 2016-02-09 MED ORDER — NITROGLYCERIN 0.4 MG SL SUBL: 0.4000 mg | SUBLINGUAL_TABLET | SUBLINGUAL | Status: DC | PRN

## 2016-02-09 MED ORDER — ATORVASTATIN CALCIUM 20 MG PO TABS
20.0000 mg | ORAL_TABLET | Freq: Every day | ORAL | Status: DC
  Administered 2016-02-09: 20 mg via ORAL
  Filled 2016-02-09: qty 1

## 2016-02-09 MED ORDER — HEPARIN (PORCINE) IN NACL 100-0.45 UNIT/ML-% IJ SOLN
1500.0000 [IU]/h | INTRAMUSCULAR | Status: DC
  Administered 2016-02-09 – 2016-02-10 (×2): 1400 [IU]/h via INTRAVENOUS
  Administered 2016-02-11 – 2016-02-12 (×2): 1500 [IU]/h via INTRAVENOUS
  Filled 2016-02-09 (×4): qty 250

## 2016-02-09 MED ORDER — HEPARIN SODIUM (PORCINE) 5000 UNIT/ML IJ SOLN
4000.0000 [IU] | Freq: Once | INTRAMUSCULAR | Status: DC
  Filled 2016-02-09: qty 1

## 2016-02-09 MED ORDER — TERAZOSIN HCL 5 MG PO CAPS
5.0000 mg | ORAL_CAPSULE | Freq: Every day | ORAL | Status: DC
Start: 2016-02-09 — End: 2016-02-12
  Administered 2016-02-09 – 2016-02-11 (×3): 5 mg via ORAL
  Filled 2016-02-09 (×3): qty 1

## 2016-02-09 MED ORDER — NITROGLYCERIN 2 % TD OINT
1.0000 [in_us] | TOPICAL_OINTMENT | Freq: Once | TRANSDERMAL | Status: AC
  Administered 2016-02-09: 1 [in_us] via TOPICAL
  Filled 2016-02-09: qty 1

## 2016-02-09 MED ORDER — GLIPIZIDE ER 2.5 MG PO TB24
2.5000 mg | ORAL_TABLET | Freq: Every day | ORAL | Status: DC
Start: 2016-02-09 — End: 2016-02-12
  Administered 2016-02-09 – 2016-02-11 (×3): 2.5 mg via ORAL
  Filled 2016-02-09 (×3): qty 1

## 2016-02-09 MED ORDER — ONDANSETRON HCL 4 MG/2ML IJ SOLN: 4.0000 mg | Freq: Four times a day (QID) | INTRAMUSCULAR | Status: DC | PRN

## 2016-02-09 MED ORDER — HEPARIN (PORCINE) IN NACL 100-0.45 UNIT/ML-% IJ SOLN: 14.0000 [IU]/kg/h | Freq: Once | INTRAMUSCULAR | Status: DC

## 2016-02-09 MED ORDER — LOSARTAN POTASSIUM 50 MG PO TABS
100.0000 mg | ORAL_TABLET | Freq: Every day | ORAL | Status: DC
  Administered 2016-02-09 – 2016-02-11 (×3): 100 mg via ORAL
  Filled 2016-02-09 (×3): qty 2

## 2016-02-09 MED ORDER — HEPARIN (PORCINE) IN NACL 100-0.45 UNIT/ML-% IJ SOLN
1200.0000 [IU]/h | INTRAMUSCULAR | Status: DC
  Administered 2016-02-09: 1200 [IU]/h via INTRAVENOUS
  Filled 2016-02-09: qty 250

## 2016-02-09 MED ORDER — CLOPIDOGREL BISULFATE 75 MG PO TABS
300.0000 mg | ORAL_TABLET | Freq: Once | ORAL | Status: AC
  Administered 2016-02-09: 300 mg via ORAL
  Filled 2016-02-09: qty 4

## 2016-02-09 MED ORDER — MORPHINE SULFATE (PF) 4 MG/ML IV SOLN: 2.0000 mg | INTRAVENOUS | Status: DC | PRN

## 2016-02-09 MED ORDER — INSULIN ASPART 100 UNIT/ML ~~LOC~~ SOLN
0.0000 [IU] | Freq: Three times a day (TID) | SUBCUTANEOUS | Status: DC
  Administered 2016-02-09: 1 [IU] via SUBCUTANEOUS
  Administered 2016-02-10: 2 [IU] via SUBCUTANEOUS
  Administered 2016-02-11: 1 [IU] via SUBCUTANEOUS
  Filled 2016-02-09 (×2): qty 1
  Filled 2016-02-09: qty 2

## 2016-02-09 MED ORDER — HEPARIN BOLUS VIA INFUSION
4000.0000 [IU] | Freq: Once | INTRAVENOUS | Status: AC
  Administered 2016-02-09: 4000 [IU] via INTRAVENOUS
  Filled 2016-02-09: qty 4000

## 2016-02-09 MED ORDER — CARVEDILOL 25 MG PO TABS
25.0000 mg | ORAL_TABLET | Freq: Two times a day (BID) | ORAL | Status: DC
  Administered 2016-02-09 – 2016-02-11 (×6): 25 mg via ORAL
  Filled 2016-02-09 (×6): qty 1

## 2016-02-09 MED ORDER — OXYCODONE-ACETAMINOPHEN 5-325 MG PO TABS
1.0000 | ORAL_TABLET | Freq: Four times a day (QID) | ORAL | Status: DC | PRN
  Administered 2016-02-12: 1 via ORAL
  Filled 2016-02-09: qty 1

## 2016-02-09 MED ORDER — HEPARIN BOLUS VIA INFUSION
1400.0000 [IU] | Freq: Once | INTRAVENOUS | Status: AC
  Administered 2016-02-09: 1400 [IU] via INTRAVENOUS
  Filled 2016-02-09: qty 1400

## 2016-02-09 MED ORDER — ACETAMINOPHEN 325 MG PO TABS
650.0000 mg | ORAL_TABLET | ORAL | Status: DC | PRN
  Administered 2016-02-09: 650 mg via ORAL
  Filled 2016-02-09: qty 2

## 2016-02-09 MED ORDER — SODIUM CHLORIDE 0.9 % IV SOLN
INTRAVENOUS | Status: DC
Start: 2016-02-09 — End: 2016-02-09
  Administered 2016-02-09: 06:00:00 via INTRAVENOUS

## 2016-02-09 MED ORDER — ASPIRIN EC 81 MG PO TBEC
81.0000 mg | DELAYED_RELEASE_TABLET | Freq: Every day | ORAL | Status: DC
  Administered 2016-02-10 – 2016-02-12 (×3): 81 mg via ORAL
  Filled 2016-02-09 (×3): qty 1

## 2016-02-09 MED ORDER — SODIUM CHLORIDE 0.9 % IV BOLUS (SEPSIS)
500.0000 mL | Freq: Once | INTRAVENOUS | Status: AC
Start: 2016-02-09 — End: 2016-02-09
  Administered 2016-02-09: 500 mL via INTRAVENOUS

## 2016-02-09 MED ORDER — ALLOPURINOL 100 MG PO TABS
200.0000 mg | ORAL_TABLET | Freq: Every day | ORAL | Status: DC
  Administered 2016-02-09 – 2016-02-11 (×3): 200 mg via ORAL
  Filled 2016-02-09 (×3): qty 2

## 2016-02-09 MED ORDER — ATORVASTATIN CALCIUM 20 MG PO TABS
40.0000 mg | ORAL_TABLET | Freq: Every day | ORAL | Status: DC
  Administered 2016-02-09 – 2016-02-11 (×3): 40 mg via ORAL
  Filled 2016-02-09 (×3): qty 2

## 2016-02-09 NOTE — Consult Note (Signed)
CARDIOLOGY CONSULT NOTE  Patient ID: James Clayton MRN: [id number] DOB/AGE: 05-24-1945 70 y.o.  Admit date: 02/09/2016 Referring Physician:  Dr. Tobi BastosPyreddy    Primary Physician: Patriciaann ClanBUTLER,[name], DO Primary Cardiologist : New  Reason for Consultation : NSTEMI  Date:  02/09/2016    Chief Complaint  Patient presents with  . Chest Pain      History of Present Illness: James Clayton is a 70 y.o. male with no previous Cardiac history who presented with chest pain. He has known history of diabetes mellitus, hypertension, hyperlipidemia and chronic kidney disease. He reports having a stress test in the remote past with no previous history of coronary revascularization or angiography.  Chest pain started around 2:00 in the morning and was severe initially. It was described as substernal tightness and heaviness which was associated with shortness of breath. He took 2 aspirins. The pain continued and thus EMS were called. The patient was noted to be very hypertensive with systolic blood pressure above 409200. He was given 3 sublingual nitroglycerin with gradual improvement in chest pain. EKG did not show significant ST deviation. Troponin increased to 2.8. He has minimal discomfort at the present time. He appears to be comfortable. He reports no similar symptoms in the past and he reports no recent exertional symptoms of chest pain or shortness of breath.    Past Medical History:  Diagnosis Date  . Diabetes mellitus without complication (HCC)   . Hypertension     Past Surgical History:  Procedure Laterality Date  . JOINT REPLACEMENT       Current Facility-Administered Medications  Medication Dose Route Frequency Provider Last Rate Last Dose  . acetaminophen (TYLENOL) tablet 650 mg  650 mg Oral Q4H PRN Ihor AustinPavan Pyreddy, MD      . allopurinol (ZYLOPRIM) tablet 200 mg  200 mg Oral Daily Ihor AustinPavan Pyreddy, MD   200 mg at 02/09/16 0943  . [START ON 02/10/2016] aspirin EC tablet 81 mg  81 mg Oral  Daily Pavan Pyreddy, MD      . atorvastatin (LIPITOR) tablet 40 mg  40 mg Oral q1800 Pavan Pyreddy, MD      . carvedilol (COREG) tablet 25 mg  25 mg Oral BID WC Ihor AustinPavan Pyreddy, MD   25 mg at 02/09/16 0852  . glipiZIDE (GLUCOTROL XL) 24 hr tablet 2.5 mg  2.5 mg Oral Q breakfast Pavan Pyreddy, MD   2.5 mg at 02/09/16 0852  . heparin ADULT infusion 100 units/mL (25000 units/2650mL sodium chloride 0.45%)  1,200 Units/hr Intravenous Continuous Irean HongJade J Sung, MD 12 mL/hr at 02/09/16 0427 1,200 Units/hr at 02/09/16 0427  . losartan (COZAAR) tablet 100 mg  100 mg Oral Daily Ihor AustinPavan Pyreddy, MD   100 mg at 02/09/16 0943  . morphine 4 MG/ML injection 2 mg  2 mg Intravenous Q4H PRN Pavan Pyreddy, MD      . nitroGLYCERIN (NITROSTAT) SL tablet 0.4 mg  0.4 mg Sublingual Q5 Min x 3 PRN Pavan Pyreddy, MD      . ondansetron (ZOFRAN) injection 4 mg  4 mg Intravenous Q6H PRN Pavan Pyreddy, MD      . terazosin (HYTRIN) capsule 5 mg  5 mg Oral QHS Ihor AustinPavan Pyreddy, MD        Allergies:   Patient has no known allergies.    Social History:  The patient  reports that he has quit smoking. He has never used smokeless tobacco. He reports that he does not drink alcohol or use drugs.   Family History:  The patient's family history includes Cancer in his mother; Heart disease in his father.    ROS:  Please see the history of present illness.   Otherwise, review of systems are positive for none.   All other systems are reviewed and negative.    PHYSICAL EXAM: VS:  BP 132/60 (BP Location: Right Arm)   Pulse 68   Temp 97.6 F (36.4 C) (Oral)   Resp 18   Ht 6\' 1"  (1.854 m)   Wt 246 lb 9.6 oz (111.9 kg)   SpO2 99%   BMI 32.53 kg/m  , BMI Body mass index is 32.53 kg/m. GEN: Well nourished, well developed, in no acute distress  HEENT: normal  Neck: no JVD, carotid bruits, or masses Cardiac: RRR; no , rubs, or gallops, mild edema . one out of 6 systolic ejection murmur in the aortic area.  Respiratory:  clear to  auscultation bilaterally, normal work of breathing GI: soft, nontender, nondistended, + BS MS: no deformity or atrophy  Skin: warm and dry, no rash Neuro:  Strength and sensation are intact Psych: euthymic mood, full affect   EKG:   Personally reviewed by me and showed: Normal sinus rhythm with nonspecific ST changes. Telemetry recently reviewed by me and showed: Normal sinus rhythm  Recent Labs: 02/09/2016: BUN 41; Creatinine, Ser 1.95; Hemoglobin 13.4; Platelets 172; Potassium 4.0; Sodium 142    Lipid Panel    Component Value Date/Time   CHOL 120 02/09/2016 0603   TRIG 273 (H) 02/09/2016 0603   HDL 23 (L) 02/09/2016 0603   CHOLHDL 5.2 02/09/2016 0603   VLDL 55 (H) 02/09/2016 0603   LDLCALC 42 02/09/2016 0603      Wt Readings from Last 3 Encounters:  02/09/16 246 lb 9.6 oz (111.9 kg)       ASSESSMENT AND PLAN:  1.  Non-ST elevation myocardial infarction: I agree with unfractionated heparin, daily aspirin and atorvastatin. I'm hesitant to start the patient on another antiplatelet medication given that he is at high risk for 3 vessel disease due to diabetes mellitus. An echocardiogram was ordered and is still pending. I recommend proceeding with cardiac catheterization and possible coronary intervention. I discussed risks and benefits and details in a specially discussed the risk of contrast-induced nephropathy given his underlying chronic kidney disease with creatinine 2. The patient will require at least 12 hours of hydration before the procedure in order to minimize the risk of contrast-induced nephropathy. I scheduled the procedure for Monday morning unless the patient develops recurrent chest pain that might require urgent Before then.  2. Essential hypertension: Blood pressure was very elevated on presentation but seems to be controlled at the present time. Continue treatment with carvedilol and losartan.   3. Hyperlipidemia: On atorvastatin 40 mg  daily.   Signed,  Lorine BearsMuhammad Arida, MD  02/09/2016 11:43 AM    [facility]

## 2016-02-09 NOTE — Progress Notes (Signed)
Received call from CCMD patient noted to be Bigeminy PVC'S on monitor, Attending made aware, nno. Will continue to monitor.

## 2016-02-09 NOTE — H&P (Signed)
[facility] [place] [facility]   PATIENT NAME: James Clayton    MR#:  [id number]  DATE OF BIRTH:  Jun 06, 1945  DATE OF ADMISSION:  02/09/2016  PRIMARY CARE PHYSICIAN: Patriciaann ClanBUTLER,[name], DO   REQUESTING/REFERRING PHYSICIAN:   CHIEF COMPLAINT:   Chief Complaint  Patient presents with  . Chest Pain    HISTORY OF PRESENT ILLNESS: James Clayton  is a 70 y.o. male with a known history of Diabetes mellitus, hypertension presented to the emergency room with chest pain. Patient noticed chest pain and 12:45 AM this morning and he woke up from sleep with chest pain. The pain is located retrosternally sharp in nature 6 out of 10 on a scale of 1-10. No radiation of pain noted. No history of any nausea or vomiting. No diaphoresis. No complaints of shortness of breath or orthopnea. Patient took two aspirin pills at home and en route to the hospital he was given nitroglycerin tablets by EMS. Initially his blood pressure was 2 26 / 1 28 mmHg but after taking nitroglycerin his blood pressure came down. EKG was normal sinus rhythm with no ST segment elevation or depression. First set of troponin was positive. Hospitalist service was consulted for further care of the patient with Non Stemi.  PAST MEDICAL HISTORY:   Past Medical History:  Diagnosis Date  . Diabetes mellitus without complication (HCC)   . Hypertension     PAST SURGICAL HISTORY: Past Surgical History:  Procedure Laterality Date  . JOINT REPLACEMENT      SOCIAL HISTORY:  Social History  Substance Use Topics  . Smoking status: Former Games developermoker  . Smokeless tobacco: Never Used  . Alcohol use No    FAMILY HISTORY:  Family History  Problem Relation Age of Onset  . Cancer Mother   . Heart disease Father     DRUG ALLERGIES: No Known Allergies  REVIEW OF SYSTEMS:   CONSTITUTIONAL: No fever, fatigue or weakness.  EYES: No blurred or double vision.  EARS, NOSE, AND THROAT: No tinnitus or ear pain.   RESPIRATORY: No cough, shortness of breath, wheezing or hemoptysis.  CARDIOVASCULAR: Has chest pain,  no orthopnea, edema.  GASTROINTESTINAL: No nausea, vomiting, diarrhea or abdominal pain.  GENITOURINARY: No dysuria, hematuria.  ENDOCRINE: No polyuria, nocturia,  HEMATOLOGY: No anemia, easy bruising or bleeding SKIN: No rash or lesion. MUSCULOSKELETAL: No joint pain or arthritis.   NEUROLOGIC: No tingling, numbness, weakness.  PSYCHIATRY: No anxiety or depression.   MEDICATIONS AT HOME:  Prior to Admission medications   Medication Sig Start Date End Date Taking? Authorizing Provider  allopurinol (ZYLOPRIM) 100 MG tablet Take 2 tablets by mouth daily. 01/22/16  Yes Historical Provider, MD  aspirin EC 81 MG tablet Take 81 mg by mouth daily.   Yes Historical Provider, MD  atorvastatin (LIPITOR) 20 MG tablet Take 1 tablet by mouth daily. 02/02/16  Yes Historical Provider, MD  carvedilol (COREG) 25 MG tablet Take 1 tablet by mouth 2 (two) times daily. 01/22/16  Yes Historical Provider, MD  furosemide (LASIX) 40 MG tablet Take 40 mg by mouth every morning. 02/02/16  Yes Historical Provider, MD  glipiZIDE (GLUCOTROL XL) 2.5 MG 24 hr tablet Take 2.5 mg by mouth daily. 01/22/16  Yes Historical Provider, MD  losartan (COZAAR) 100 MG tablet Take 100 mg by mouth daily. 02/02/16  Yes Historical Provider, MD  sildenafil (REVATIO) 20 MG tablet Take 1-2 tablets by mouth as needed. 01/04/16  Yes Historical Provider, MD  terazosin (HYTRIN) 5  MG capsule Take 1 capsule by mouth at bedtime. 01/22/16  Yes Historical Provider, MD      PHYSICAL EXAMINATION:   VITAL SIGNS: Blood pressure (!) 147/77, pulse 77, resp. rate 15, height 6\' 1"  (1.854 m), weight 113.4 kg (250 lb), SpO2 100 %.  GENERAL:  70 y.o.-year-old patient lying in the bed with no acute distress.  EYES: Pupils equal, round, reactive to light and accommodation. No scleral icterus. Extraocular muscles intact.  HEENT: Head atraumatic,  normocephalic. Oropharynx and nasopharynx clear.  NECK:  Supple, no jugular venous distention. No thyroid enlargement, no tenderness.  LUNGS: Normal breath sounds bilaterally, no wheezing, rales,rhonchi or crepitation. No use of accessory muscles of respiration.  CARDIOVASCULAR: S1, S2 normal. No murmurs, rubs, or gallops.  ABDOMEN: Soft, nontender, nondistended. Bowel sounds present. No organomegaly or mass.  EXTREMITIES: No pedal edema, cyanosis, or clubbing.  NEUROLOGIC: Cranial nerves II through XII are intact. Muscle strength 5/5 in all extremities. Sensation intact. Gait not checked.  PSYCHIATRIC: The patient is alert and oriented x 3.  SKIN: No obvious rash, lesion, or ulcer.   LABORATORY PANEL:   CBC  Recent Labs Lab 02/09/16 0309  WBC 11.7*  HGB 14.2  HCT 42.3  PLT 182  MCV 88.7  MCH 29.7  MCHC 33.5  RDW 14.9*   ------------------------------------------------------------------------------------------------------------------  Chemistries   Recent Labs Lab 02/09/16 0309  NA 141  K 4.0  CL 108  CO2 26  GLUCOSE 215*  BUN 40*  CREATININE 2.04*  CALCIUM 9.3   ------------------------------------------------------------------------------------------------------------------ estimated creatinine clearance is 44.5 mL/min (by C-G formula based on SCr of 2.04 mg/dL (H)). ------------------------------------------------------------------------------------------------------------------ No results for input(s): TSH, T4TOTAL, T3FREE, THYROIDAB in the last 72 hours.  Invalid input(s): FREET3   Coagulation profile  Recent Labs Lab 02/09/16 0309  INR 1.02   ------------------------------------------------------------------------------------------------------------------- No results for input(s): DDIMER in the last 72 hours. -------------------------------------------------------------------------------------------------------------------  Cardiac Enzymes  Recent  Labs Lab 02/09/16 0309  TROPONINI 0.28*   ------------------------------------------------------------------------------------------------------------------ Invalid input(s): POCBNP  ---------------------------------------------------------------------------------------------------------------  Urinalysis    Component Value Date/Time   COLORURINE Yellow 12/01/2012 1019   APPEARANCEUR Clear 12/01/2012 1019   LABSPEC 1.005 12/01/2012 1019   PHURINE 5.0 12/01/2012 1019   GLUCOSEU Negative 12/01/2012 1019   HGBUR Negative 12/01/2012 1019   BILIRUBINUR Negative 12/01/2012 1019   KETONESUR Negative 12/01/2012 1019   PROTEINUR 25 mg/dL 69/62/9528 4132   NITRITE Negative 12/01/2012 1019   LEUKOCYTESUR Negative 12/01/2012 1019     RADIOLOGY: Dg Chest 2 View  Result Date: 02/09/2016 CLINICAL DATA:  Left-sided chest pain radiating to both arms beginning at 0045 hours. Hypertension. EXAM: CHEST  2 VIEW COMPARISON:  None. FINDINGS: Shallow inspiration. Normal heart size and pulmonary vascularity. Diffuse interstitial pattern to the lungs with central predominance. This may represent bronchitic change, fibrosis, or edema. No focal consolidation or airspace disease. No blunting of costophrenic angles. No pneumothorax. Calcification of the aorta. IMPRESSION: Diffuse mostly central interstitial changes to the lungs could indicate bronchitic changes, fibrosis, or edema. No focal consolidation. Normal heart size. Electronically Signed   By: Burman Nieves M.D.   On: 02/09/2016 03:48    EKG: Orders placed or performed during the hospital encounter of 02/09/16  . EKG 12-Lead  . EKG 12-Lead  . ED EKG within 10 minutes  . ED EKG within 10 minutes    IMPRESSION AND PLAN: 70 year old male patient with history of diabetes meters, hypertension presented to the emergency room with chest pain. Admitting diagnosis 1. Non-ST elevation MI  2. Hypertension 3. Type 2 diabetes mellitus 4. Acute renal  insufficiency Treatment plan  Admit patient to telemetry under inpatient service Aspirin 81 mg daily When necessary nitrates for chest pain Start patient on IV heparin drip Cycle serial troponin Check echocardiogram to assess LV function Monitor renal function Cardiology consultation Resume beta blocker at home dose. Patient follows up with cardiology in [place]    All the records are reviewed and case discussed with ED provider. Management plans discussed with the patient, family and they are in agreement.  CODE STATUS:FULL Code Status History    This patient does not have a recorded code status. Please follow your organizational policy for patients in this situation.       TOTAL TIME TAKING CARE OF THIS PATIENT: 50 minutes.    Ihor AustinPavan Pyreddy M.D on 02/09/2016 at 4:51 AM  Between 7am to 6pm - Pager - [phone]  After 6pm go to [email or web address] - password [email or web address]  [facility]  Office  [phone]  CC: Primary care physician; Patriciaann ClanBUTLER,[name], DO

## 2016-02-09 NOTE — Progress Notes (Signed)
ANTICOAGULATION CONSULT NOTE - Initial Consult  Pharmacy Consult for heparin Indication: chest pain/ACS  No Known Allergies  Patient Measurements: Height: 6\' 1"  (185.4 cm) Weight: 246 lb 9.6 oz (111.9 kg) IBW/kg (Calculated) : 79.9 Heparin Dosing Weight: 103.9 kg  Vital Signs: Temp: 97.6 F (36.4 C) (11/24 0820) Temp Source: Oral (11/24 0820) BP: 132/60 (11/24 0820) Pulse Rate: 68 (11/24 0820)  Labs:  Recent Labs  02/09/16 0309 02/09/16 0603 02/09/16 1240  HGB 14.2 13.4  --   HCT 42.3 40.0  --   PLT 182 172  --   APTT 31  --   --   LABPROT 13.4  --   --   INR 1.02  --   --   HEPARINUNFRC  --   --  0.24*  CREATININE 2.04* 1.95*  --   TROPONINI 0.28* 2.81*  --     Estimated Creatinine Clearance: 46.2 mL/min (by C-G formula based on SCr of 1.95 mg/dL (H)).   Medical History: Past Medical History:  Diagnosis Date  . Diabetes mellitus without complication (HCC)   . Hypertension     Medications:  Scheduled:  . allopurinol  200 mg Oral Daily  . [START ON 02/10/2016] aspirin EC  81 mg Oral Daily  . atorvastatin  40 mg Oral q1800  . carvedilol  25 mg Oral BID WC  . glipiZIDE  2.5 mg Oral Q breakfast  . insulin aspart  0-9 Units Subcutaneous TID WC  . losartan  100 mg Oral Daily  . terazosin  5 mg Oral QHS    Assessment: 70 yom with left sided CP per EMS. Pharmacy consulted to dose heparin for ACS. No AC PTA per RN/med list.  Goal of Therapy:  Heparin level 0.3-0.7 units/ml Monitor platelets by anticoagulation protocol: Yes   Plan: 11/24 HL: subtherapeutic at 0.24 Will order a 1400 unit bolus and increase infusion to heparin 1400units/hr.   Continue to monitor H&H and platelets  Yaroslav Gombos M Muhannad Bignell, Pharm.D. Clinical Pharmacist 02/09/2016,1:19 PM

## 2016-02-09 NOTE — Progress Notes (Signed)
MD Pyreddy has been paged to make him aware of new troponin level of 2.81

## 2016-02-09 NOTE — Progress Notes (Signed)
Attending Dr. Allena KatzPatel made aware of new troponin level of 6.16. No new order at this time, will continue to monitor.

## 2016-02-09 NOTE — Progress Notes (Signed)
[employer] - [place] [facility]                                                                                                                                                                                  Patient Demographics   James ChimesJames Clayton, is a 70 y.o. male, DOB - September 30, 1945, ZOX:[id number]  Admit date - 02/09/2016   Admitting Physician Ihor AustinPavan Pyreddy, MD  Outpatient Primary MD for the patient is [name],[name], DO   LOS - 0  Subjective: admited with cp, currently no cp no sob    Review of Systems:   CONSTITUTIONAL: No documented fever. No fatigue, weakness. No weight gain, no weight loss.  EYES: No blurry or double vision.  ENT: No tinnitus. No postnasal drip. No redness of the oropharynx.  RESPIRATORY: No cough, no wheeze, no hemoptysis. No dyspnea.  CARDIOVASCULAR: + chest pain. No orthopnea. No palpitations. No syncope.  GASTROINTESTINAL: No nausea, no vomiting or diarrhea. No abdominal pain. No melena or hematochezia.  GENITOURINARY: No dysuria or hematuria.  ENDOCRINE: No polyuria or nocturia. No heat or cold intolerance.  HEMATOLOGY: No anemia. No bruising. No bleeding.  INTEGUMENTARY: No rashes. No lesions.  MUSCULOSKELETAL: No arthritis. No swelling. No gout.  NEUROLOGIC: No numbness, tingling, or ataxia. No seizure-type activity.  PSYCHIATRIC: No anxiety. No insomnia. No ADD.    Vitals:   Vitals:   02/09/16 0500 02/09/16 0530 02/09/16 0600 02/09/16 0820  BP: 128/72 127/67 (!) 148/66 132/60  Pulse: 65 (!) 59 76 68  Resp: (!) 26 19 18    Temp:   97.6 F (36.4 C) 97.6 F (36.4 C)  TempSrc:   Oral Oral  SpO2: 99% 96% 97% 99%  Weight:   246 lb 9.6 oz (111.9 kg)   Height:        Wt Readings from Last 3 Encounters:  02/09/16 246 lb 9.6 oz (111.9 kg)     Intake/Output Summary (Last 24 hours) at 02/09/16 1432 Last data filed at 02/09/16 1054  Gross per 24 hour  Intake             78.7 ml  Output              225 ml  Net            -146.3 ml    Physical Exam:   GENERAL: Pleasant-appearing in no apparent distress.  HEAD, EYES, EARS, NOSE AND THROAT: Atraumatic, normocephalic. Extraocular muscles are intact. Pupils equal and reactive to light. Sclerae anicteric. No conjunctival injection. No oro-pharyngeal erythema.  NECK: Supple. There is no jugular venous distention. No bruits, no lymphadenopathy, no thyromegaly.  HEART: Regular rate and rhythm,. No murmurs,  no rubs, no clicks.  LUNGS: Clear to auscultation bilaterally. No rales or rhonchi. No wheezes.  ABDOMEN: Soft, flat, nontender, nondistended. Has good bowel sounds. No hepatosplenomegaly appreciated.  EXTREMITIES: No evidence of any cyanosis, clubbing, or peripheral edema.  +2 pedal and radial pulses bilaterally.  NEUROLOGIC: The patient is alert, awake, and oriented x3 with no focal motor or sensory deficits appreciated bilaterally.  SKIN: Moist and warm with no rashes appreciated.  Psych: Not anxious, depressed LN: No inguinal LN enlargement    Antibiotics   Anti-infectives    None      Medications   Scheduled Meds: . allopurinol  200 mg Oral Daily  . [START ON 02/10/2016] aspirin EC  81 mg Oral Daily  . atorvastatin  40 mg Oral q1800  . carvedilol  25 mg Oral BID WC  . glipiZIDE  2.5 mg Oral Q breakfast  . insulin aspart  0-9 Units Subcutaneous TID WC  . losartan  100 mg Oral Daily  . terazosin  5 mg Oral QHS   Continuous Infusions: . heparin 1,400 Units/hr (02/09/16 1326)   PRN Meds:.acetaminophen, morphine injection, nitroGLYCERIN, ondansetron (ZOFRAN) IV, oxyCODONE-acetaminophen   Data Review:   Micro Results No results found for this or any previous visit (from the past 240 hour(s)).  Radiology Reports Dg Chest 2 View  Result Date: 02/09/2016 CLINICAL DATA:  Left-sided chest pain radiating to both arms beginning at 0045 hours. Hypertension. EXAM: CHEST  2 VIEW COMPARISON:  None. FINDINGS: Shallow inspiration. Normal heart size and  pulmonary vascularity. Diffuse interstitial pattern to the lungs with central predominance. This may represent bronchitic change, fibrosis, or edema. No focal consolidation or airspace disease. No blunting of costophrenic angles. No pneumothorax. Calcification of the aorta. IMPRESSION: Diffuse mostly central interstitial changes to the lungs could indicate bronchitic changes, fibrosis, or edema. No focal consolidation. Normal heart size. Electronically Signed   By: Burman NievesWilliam  Stevens M.D.   On: 02/09/2016 03:48     CBC  Recent Labs Lab 02/09/16 0309 02/09/16 0603  WBC 11.7* 12.9*  HGB 14.2 13.4  HCT 42.3 40.0  PLT 182 172  MCV 88.7 88.9  MCH 29.7 29.7  MCHC 33.5 33.4  RDW 14.9* 15.0*    Chemistries   Recent Labs Lab 02/09/16 0309 02/09/16 0603  NA 141 142  K 4.0 4.0  CL 108 112*  CO2 26 23  GLUCOSE 215* 193*  BUN 40* 41*  CREATININE 2.04* 1.95*  CALCIUM 9.3 9.5   ------------------------------------------------------------------------------------------------------------------ estimated creatinine clearance is 46.2 mL/min (by C-G formula based on SCr of 1.95 mg/dL (H)). ------------------------------------------------------------------------------------------------------------------ No results for input(s): HGBA1C in the last 72 hours. ------------------------------------------------------------------------------------------------------------------  Recent Labs  02/09/16 0603  CHOL 120  HDL 23*  LDLCALC 42  TRIG [id number]*  CHOLHDL 5.2   ------------------------------------------------------------------------------------------------------------------ No results for input(s): TSH, T4TOTAL, T3FREE, THYROIDAB in the last 72 hours.  Invalid input(s): FREET3 ------------------------------------------------------------------------------------------------------------------ No results for input(s): VITAMINB12, FOLATE, FERRITIN, TIBC, IRON, RETICCTPCT in the last 72  hours.  Coagulation profile  Recent Labs Lab 02/09/16 0309  INR 1.02    No results for input(s): DDIMER in the last 72 hours.  Cardiac Enzymes  Recent Labs Lab 02/09/16 0309 02/09/16 0603 02/09/16 1240  TROPONINI 0.28* 2.81* 6.16*   ------------------------------------------------------------------------------------------------------------------ Invalid input(s): POCBNP    Assessment & Plan  Patient 70 year old with history of hypertension diabetes chronic kidney disease presents with chest pain 1.  Non-ST elevation MI: Seen by cardiology he needs IV hydration due to his chronic renal  failure prior to any type of cardiac catheterization. Patient will be given IV fluids starting Sunday cardiac catheter on Monday Continue therapy with aspirin and heparin, Coreg  2. Hypertension blood pressure currently stable: Continue therapy with Coreg 3. Type 2 diabetes mellitus: Place patient on sliding scale insulin continue his glipizide 4.  chronic renal failure based on reviewing his previous labs his renal function is similar to outpatient. He will need IV hydration prior to cardiac     Code Status Orders        Start     Ordered   02/09/16 0553  Full code  Continuous     11 /24/17 9147    Code Status History    Date Active Date Inactive Code Status Order ID Comments User Context   This patient has a current code status but no historical code status.    Advance Directive Documentation   Flowsheet Row Most Recent Value  Type of Advance Directive  Living will, Healthcare Power of Attorney  Pre-existing out of facility DNR order (yellow form or pink MOST form)  No data  "MOST" Form in Place?  No data           Consults  Cardiology   DVT Prophylaxis  heparin  Lab Results  Component Value Date   PLT 172 02/09/2016     Time Spent in minutes   Greater than 50% of time spent in care coordination and counseling patient regarding the condition and plan of  care.   Auburn Bilberry M.D on 02/09/2016 at 2:32 PM  Between 7am to 6pm - Pager - [phone]  After 6pm go to [email or web address] - password [email or web address] [facility]  [facility]   Office  [phone]

## 2016-02-09 NOTE — ED Provider Notes (Signed)
[facility] Emergency Department Provider Note   ____________________________________________   First MD Initiated Contact with Patient 02/09/16 [phone]     (approximate)  I have reviewed the triage vital signs and the nursing notes.   HISTORY  Chief Complaint Chest Pain    HPI James Clayton is a 70 y.o. male brought to the ED from home via EMS with a chief complaint of left sided chest pain. Patient reports onset of chest tightness/pressure approximately 12:40 AM while going to sleep. He took 2 full strength aspirin at that time. EMS reports initial blood pressure 226/128. 3 nitroglycerin were given en routewith improvement in blood pressure as well as patient's pain. Currently his chest pain is 1/10. Symptoms were associated with some nausea; no diaphoresis, shortness of breath or dizziness. Denies recent fever, chills, cough, congestion, abdominal pain, vomiting, diarrhea. Denies recent travel or trauma.   Past Medical History:  Diagnosis Date  . Diabetes mellitus without complication (HCC)   . Hypertension     There are no active problems to display for this patient.   Past Surgical History:  Procedure Laterality Date  . JOINT REPLACEMENT      Prior to Admission medications   Not on File    Allergies Patient has no known allergies.  History reviewed. No pertinent family history.  Social History Social History  Substance Use Topics  . Smoking status: Former Games developermoker  . Smokeless tobacco: Never Used  . Alcohol use No    Review of Systems  Constitutional: No fever/chills. Eyes: No visual changes. ENT: No sore throat. Cardiovascular: Positive for chest pain. Respiratory: Denies shortness of breath. Gastrointestinal: No abdominal pain.  Positive for nausea, no vomiting.  No diarrhea.  No constipation. Genitourinary: Negative for dysuria. Musculoskeletal: Negative for back pain. Skin: Negative for rash. Neurological: Negative for  headaches, focal weakness or numbness.  10-point ROS otherwise negative.  ____________________________________________   PHYSICAL EXAM:  VITAL SIGNS: ED Triage Vitals  Enc Vitals Group     BP --      Pulse --      Resp --      Temp --      Temp src --      SpO2 --      Weight 02/09/16 0310 250 lb (113.4 kg)     Height 02/09/16 0310 6\' 1"  (1.854 m)     Head Circumference --      Peak Flow --      Pain Score 02/09/16 0311 1     Pain Loc --      Pain Edu? --      Excl. in GC? --     Constitutional: Alert and oriented. Well appearing and in no acute distress. Eyes: Conjunctivae are normal. PERRL. EOMI. Head: Atraumatic. Nose: No congestion/rhinnorhea. Mouth/Throat: Mucous membranes are moist.  Oropharynx non-erythematous. Neck: No stridor.   Cardiovascular: Normal rate, regular rhythm. Grossly normal heart sounds.  Good peripheral circulation. Respiratory: Normal respiratory effort.  No retractions. Lungs CTAB. Gastrointestinal: Soft and nontender. No distention. No abdominal bruits. No CVA tenderness. Musculoskeletal: No lower extremity tenderness. 1+ nonpitting BLE edema.  No joint effusions. Neurologic:  Normal speech and language. No gross focal neurologic deficits are appreciated. No gait instability. Skin:  Skin is warm, dry and intact. No rash noted. Psychiatric: Mood and affect are normal. Speech and behavior are normal.  ____________________________________________   LABS (all labs ordered are listed, but only abnormal results are displayed)  Labs Reviewed  CBC -  Abnormal; Notable for the following:       Result Value   WBC 11.7 (*)    RDW 14.9 (*)    All other components within normal limits  BASIC METABOLIC PANEL  TROPONIN I   ____________________________________________  EKG  ED ECG REPORT I, SUNG,JADE J, the attending physician, personally viewed and interpreted this ECG.   Date: 02/09/2016  EKG Time: 0311  Rate: 84  Rhythm: normal EKG,  normal sinus rhythm  Axis: Normal  Intervals:none  ST&T Change: Nonspecific  ____________________________________________  RADIOLOGY  Chest x-ray (viewed by me, interpreted per Dr. Andria MeuseStevens): Diffuse mostly central interstitial changes to the lungs could  indicate bronchitic changes, fibrosis, or edema. No focal  consolidation. Normal heart size.   ____________________________________________   PROCEDURES  Procedure(s) performed: None  Procedures  Critical Care performed:   CRITICAL CARE Performed by: Irean HongSUNG,JADE J   Total critical care time: 30 minutes  Critical care time was exclusive of separately billable procedures and treating other patients.  Critical care was necessary to treat or prevent imminent or life-threatening deterioration.  Critical care was time spent personally by me on the following activities: development of treatment plan with patient and/or surrogate as well as nursing, discussions with consultants, evaluation of patient's response to treatment, examination of patient, obtaining history from patient or surrogate, ordering and performing treatments and interventions, ordering and review of laboratory studies, ordering and review of radiographic studies, pulse oximetry and re-evaluation of patient's condition.  ____________________________________________   INITIAL IMPRESSION / ASSESSMENT AND PLAN / ED COURSE  Pertinent labs & imaging results that were available during my care of the patient were reviewed by me and considered in my medical decision making (see chart for details).  70 year old male with a history of hypertension and diabetes who presents with left-sided chest pain with associated hypertension. Pain in blood pressure improved after 3 nitroglycerin. Patient took 2 full strength aspirin prior to arrival. Will apply nitroglycerin paste and discuss with hospitalist to evaluate patient in the emergency department for admission.  Clinical  Course as of Feb 09 411  Fri Feb 09, 2016  [id number] Laboratory called with troponin value 0.28. Will initiate heparin bolus and drip.  [JS]    Clinical Course User Index [JS] Irean HongJade J Sung, MD     ____________________________________________   FINAL CLINICAL IMPRESSION(S) / ED DIAGNOSES  Final diagnoses:  Essential hypertension  Chest pain, unspecified type  NSTEMI (non-ST elevated myocardial infarction) (HCC)      NEW MEDICATIONS STARTED DURING THIS VISIT:  New Prescriptions   No medications on file     Note:  This document was prepared using Dragon voice recognition software and may include unintentional dictation errors.    Irean HongJade J Sung, MD 02/09/16 [phone]

## 2016-02-09 NOTE — Progress Notes (Addendum)
ANTICOAGULATION CONSULT NOTE - Initial Consult  Pharmacy Consult for heparin Indication: chest pain/ACS  No Known Allergies  Patient Measurements: Height: 6\' 1"  (185.4 cm) Weight: 246 lb 9.6 oz (111.9 kg) IBW/kg (Calculated) : 79.9 Heparin Dosing Weight: 103.9 kg  Vital Signs: Temp: 98.7 F (37.1 C) (11/24 1959) Temp Source: Oral (11/24 1959) BP: 133/68 (11/24 1959) Pulse Rate: 74 (11/24 1959)  Labs:  Recent Labs  02/09/16 0309 02/09/16 0603 02/09/16 1240 02/09/16 1921  HGB 14.2 13.4  --   --   HCT 42.3 40.0  --   --   PLT 182 172  --   --   APTT 31  --   --   --   LABPROT 13.4  --   --   --   INR 1.02  --   --   --   HEPARINUNFRC  --   --  0.24* 0.33  CREATININE 2.04* 1.95*  --   --   TROPONINI 0.28* 2.81* 6.16* 4.29*    Estimated Creatinine Clearance: 46.2 mL/min (by C-G formula based on SCr of 1.95 mg/dL (H)).   Medical History: Past Medical History:  Diagnosis Date  . Diabetes mellitus without complication (HCC)   . Hypertension     Medications:  Scheduled:  . allopurinol  200 mg Oral Daily  . [START ON 02/10/2016] aspirin EC  81 mg Oral Daily  . atorvastatin  40 mg Oral q1800  . carvedilol  25 mg Oral BID WC  . glipiZIDE  2.5 mg Oral Q breakfast  . insulin aspart  0-9 Units Subcutaneous TID WC  . losartan  100 mg Oral Daily  . terazosin  5 mg Oral QHS    Assessment: 70 yom with left sided CP per EMS. Pharmacy consulted to dose heparin for ACS. No AC PTA per RN/med list.  Goal of Therapy:  Heparin level 0.3-0.7 units/ml Monitor platelets by anticoagulation protocol: Yes   Plan: 11/24 HL: subtherapeutic at 0.24 Will order a 1400 unit bolus and increase infusion to heparin 1400units/hr.   Continue to monitor H&H and platelets  11/24:  HL @ 19:30 = 0.33 Will continue this pt on current rate and recheck HL in 6 hrs on 11/25 @ 0130.   11/25 0148 HL therapeutic x 2. Continue current rate. Pharmacy will continue to monitor daily. Deidrea Gaetz A.  [place], [place]arm.D., BCPS, Clinical Pharmacist  Robbins,Jason D, Pharm.D. Clinical Pharmacist 02/09/2016,8:35 PM

## 2016-02-09 NOTE — Care Management Important Message (Signed)
Important Message  Patient Details  Name: James ChimesJames Clayton MRN: [id number] Date of Birth: [birth year]   Medicare Important Message Given:  Yes    Madyson Lukach A, RN 02/09/2016, 7:55 AM

## 2016-02-09 NOTE — Progress Notes (Signed)
ANTICOAGULATION CONSULT NOTE - Initial Consult  Pharmacy Consult for heparin Indication: chest pain/ACS  No Known Allergies  Patient Measurements: Height: 6\' 1"  (185.4 cm) Weight: 250 lb (113.4 kg) IBW/kg (Calculated) : 79.9 Heparin Dosing Weight: 103.9 kg  Vital Signs: BP: 147/77 (11/24 0400) Pulse Rate: 77 (11/24 0400)  Labs:  Recent Labs  02/09/16 0309  HGB 14.2  HCT 42.3  PLT 182    CrCl cannot be calculated (Patient's most recent lab result is older than the maximum 21 days allowed.).   Medical History: Past Medical History:  Diagnosis Date  . Diabetes mellitus without complication (HCC)   . Hypertension     Medications:  Scheduled:  . heparin  4,000 Units Intravenous Once  . nitroGLYCERIN  1 inch Topical Once    Assessment: 70 yom with left sided CP per EMS. Pharmacy consulted to dose heparin for ACS. No AC PTA per RN/med list.  Goal of Therapy:  Heparin level 0.3-0.7 units/ml Monitor platelets by anticoagulation protocol: Yes   Plan:  Give 4000 units bolus x 1 Start heparin infusion at 1200 units/hr Check anti-Xa level in 6 hours and daily while on heparin - baseline BMP still pending, schedule for 6 hrs, may push to 8 hrs if CrCl < 30 mL/min. Continue to monitor H&H and platelets  Carola FrostNathan A Ayah Cozzolino, Pharm.D., BCPS Clinical Pharmacist 02/09/2016,4:14 AM

## 2016-02-09 NOTE — ED Triage Notes (Signed)
Pt arrived by EMS from home with c/o left sided chest pain that radiated to both arms, started at 0045. EMS reports pt hypertensive at scene (226/128), Aspirin 650mg  and 3 nitro's given before arrival to ED, BP post treatment (147/100). Pts chest pain 1/10 upon arrival to ED. Pt A&Ox4.

## 2016-02-10 ENCOUNTER — Inpatient Hospital Stay (HOSPITAL_COMMUNITY)
Admit: 2016-02-10 | Discharge: 2016-02-10 | Disposition: A | Discharge status: Home / Self Care | Payer: Medicare Other | Attending: Internal Medicine | Admitting: Internal Medicine

## 2016-02-10 DIAGNOSIS — E785 Hyperlipidemia, unspecified: Secondary | ICD-10-CM

## 2016-02-10 DIAGNOSIS — I10 Essential (primary) hypertension: Secondary | ICD-10-CM

## 2016-02-10 DIAGNOSIS — I214 Non-ST elevation (NSTEMI) myocardial infarction: Principal | ICD-10-CM

## 2016-02-10 DIAGNOSIS — R079 Chest pain, unspecified: Secondary | ICD-10-CM

## 2016-02-10 LAB — CBC
HEMATOCRIT: 38.5 % — AB (ref 40.0–52.0)
HEMOGLOBIN: 12.5 g/dL — AB (ref 13.0–18.0)
MCH: 29.6 pg (ref 26.0–34.0)
MCHC: 32.5 g/dL (ref 32.0–36.0)
MCV: 91 fL (ref 80.0–100.0)
Platelets: 150 10*3/uL (ref 150–440)
RBC: 4.23 MIL/uL — ABNORMAL LOW (ref 4.40–5.90)
RDW: 15.9 % — AB (ref 11.5–14.5)
WBC: 12.4 10*3/uL — ABNORMAL HIGH (ref 3.8–10.6)

## 2016-02-10 LAB — GLUCOSE, CAPILLARY
GLUCOSE-CAPILLARY: 108 mg/dL — AB (ref 65–99)
GLUCOSE-CAPILLARY: 104 mg/dL — AB (ref 65–99)
Glucose-Capillary: 165 mg/dL — ABNORMAL HIGH (ref 65–99)
Glucose-Capillary: 116 mg/dL — ABNORMAL HIGH (ref 65–99)

## 2016-02-10 LAB — HEPARIN LEVEL (UNFRACTIONATED): Heparin Unfractionated: 0.34 IU/mL (ref 0.30–0.70)

## 2016-02-10 LAB — TROPONIN I: Troponin I: 2.46 ng/mL (ref <0.03)

## 2016-02-10 NOTE — Progress Notes (Signed)
Patient Name: James ChimesJames Rovira Date of Encounter: 02/10/2016  Primary Cardiologist: Dr. Emi HolesArida  Hospital Problem List     Principal Problem:   Non-STEMI (non-ST elevated myocardial infarction) [facility](HCC) Active Problems:   Diabetes mellitus without complication (HCC)   Hypertension     Subjective   Patient is doing well. CP last night but mild, 2/10, few seconds. No CP right now. No SOB, palpitations, PND, orthopnea. Minimal bruising on L hand.   Inpatient Medications    Scheduled Meds: . allopurinol  200 mg Oral Daily  . aspirin EC  81 mg Oral Daily  . atorvastatin  40 mg Oral q1800  . carvedilol  25 mg Oral BID WC  . glipiZIDE  2.5 mg Oral Q breakfast  . insulin aspart  0-9 Units Subcutaneous TID WC  . losartan  100 mg Oral Daily  . terazosin  5 mg Oral QHS   Continuous Infusions: . heparin 1,400 Units/hr (02/09/16 1933)   PRN Meds: acetaminophen, morphine injection, nitroGLYCERIN, ondansetron (ZOFRAN) IV, oxyCODONE-acetaminophen   Vital Signs    Vitals:   02/09/16 1959 02/10/16 0433 02/10/16 0434 02/10/16 0754  BP: 133/68 (!) 151/104 (!) 168/82 (!) 161/84  Pulse: 74 84 76 77  Resp: 15 17    Temp: 98.7 F (37.1 C) 97.8 F (36.6 C)  98.1 F (36.7 C)  TempSrc: Oral Oral  Oral  SpO2: 95% 97%  98%  Weight:      Height:        Intake/Output Summary (Last 24 hours) at 02/10/16 0910 Last data filed at 02/10/16 0458  Gross per 24 hour  Intake              480 ml  Output             1225 ml  Net             -745 ml   Filed Weights   02/09/16 0310 02/09/16 0600  Weight: 250 lb (113.4 kg) 246 lb 9.6 oz (111.9 kg)   Body mass index is 32.53 kg/m.   Physical Exam  GENERAL:  well developed, well nourished, obese, not in acute distress HEENT: normocephalic, pink conjunctivae, anicteric sclerae, no xanthelasma, normal dentition, oropharynx clear NECK:  no neck vein engorgement, JVP normal, no hepatojugular reflux, carotid upstroke brisk and symmetric, no bruit,  no thyromegaly, no lymphadenopathy LUNGS:  good respiratory effort, clear to auscultation bilaterally CV:  PMI not displaced, no thrills, no lifts, S1 and S2 within normal limits, no palpable S3 or S4, no murmurs, no rubs, no gallops ABD:  Soft, nontender, nondistended, normoactive bowel sounds, no abdominal aortic bruit, no hepatomegaly, no splenomegaly MS: nontender back, no kyphosis, no scoliosis, no joint deformities EXT:  2+ DP/PT pulses, no edema, no varicosities, no cyanosis, no clubbing SKIN: warm, nondiaphoretic, normal turgor, no ulcers NEUROPSYCH: alert, oriented to person, place, and time, sensory/motor grossly intact, normal mood, appropriate affect    Labs    CBC  Recent Labs  02/09/16 0309 02/09/16 0603  WBC 11.7* 12.9*  HGB 14.2 13.4  HCT 42.3 40.0  MCV 88.7 88.9  PLT 182 172   Basic Metabolic Panel  Recent Labs  02/09/16 0309 02/09/16 0603  NA 141 142  K 4.0 4.0  CL 108 112*  CO2 26 23  GLUCOSE 215* 193*  BUN 40* 41*  CREATININE 2.04* 1.95*  CALCIUM 9.3 9.5   Liver Function Tests No results for input(s): AST, ALT, ALKPHOS, BILITOT, PROT, ALBUMIN in the  last 72 hours. No results for input(s): LIPASE, AMYLASE in the last 72 hours. Cardiac Enzymes  Recent Labs  02/09/16 0603 02/09/16 1240 02/09/16 1921  TROPONINI 2.81* 6.16* 4.29*   BNP Invalid input(s): POCBNP D-Dimer No results for input(s): DDIMER in the last 72 hours. Hemoglobin A1C No results for input(s): HGBA1C in the last 72 hours. Fasting Lipid Panel  Recent Labs  02/09/16 0603  CHOL 120  HDL 23*  LDLCALC 42  TRIG 161273*  CHOLHDL 5.2   Thyroid Function Tests No results for input(s): TSH, T4TOTAL, T3FREE, THYROIDAB in the last 72 hours.  Invalid input(s): [employer]REET3  Telemetry    SR. PVCs/vent bigeminy 02/09/2016 (none today) - Personally Reviewed  ECG    02/09/2016: SR 84 - Personally Reviewed  Radiology    Dg Chest 2 View  Result Date: 02/09/2016 CLINICAL DATA:   Left-sided chest pain radiating to both arms beginning at 0045 hours. Hypertension. EXAM: CHEST  2 VIEW COMPARISON:  None. FINDINGS: Shallow inspiration. Normal heart size and pulmonary vascularity. Diffuse interstitial pattern to the lungs with central predominance. This may represent bronchitic change, fibrosis, or edema. No focal consolidation or airspace disease. No blunting of costophrenic angles. No pneumothorax. Calcification of the aorta. IMPRESSION: Diffuse mostly central interstitial changes to the lungs could indicate bronchitic changes, fibrosis, or edema. No focal consolidation. Normal heart size. Electronically Signed   By: Burman NievesWilliam  Stevens M.D.   On: 02/09/2016 03:48    Cardiac Studies   Echo is pending  Patient Profile     James ChimesJames Sinning is a 70 y.o. male with history of diabetes mellitus, hypertension, hyperlipidemia and chronic kidney disease. He presented with CP and diagnosed with NSTEMI  Assessment & Plan    Non-ST elevation myocardial infarction No CP currently. Episode last night but very brief and mild. Rec bedrest, may go to chair, bedside commode. NTG SL prn CP or NTG IV if indicated. Continue with unfractionated heparin, daily aspirin and atorvastatin.  May add Imdur ER 30 mg po qd if BP still > 140/80.  An echocardiogram was ordered and is still pending. Cardiac catheterization and possible coronary intervention on 02/12/2016 c/o Dr. Kirke CorinArida. The patient will require at least 12 hours of hydration before the procedure in order to minimize the risk of contrast-induced nephropathy.  Essential hypertension Blood pressure improving but continue to monitor. May use Imdur as above.Continue treatment with carvedilol and losartan.  Hyperlipidemia On atorvastatin 40 mg QHS.Marland Kitchen. LDL < 70 goal. Lifestyle changes recommended.   May benefit from sleep study as outpatient -- heavy snoring, HTN on mult meds, obesity.   I spent at least 35 minutes with the patient today and  more than 50% of the time was spent counseling the patient and coordinating care.     Signed, Almond LintAileen Tacarra Justo, MD, [facility] 02/10/2016, 9:10 AM

## 2016-02-10 NOTE — Progress Notes (Signed)
[employer] - [place] at [facility]                                                                                                                                                                                  Patient Demographics   James Clayton, is a 70 y.o. male, DOB - [birth year], BJY:[id number]  Admit date - 02/09/2016   Admitting Physician Ihor Austin, MD  Outpatient Primary MD for the patient is [name],[name], DO   LOS - 1  Subjective: The episode of chest pain with walking to the bathroom but no further episodes since    Review of Systems:   CONSTITUTIONAL: No documented fever. No fatigue, weakness. No weight gain, no weight loss.  EYES: No blurry or double vision.  ENT: No tinnitus. No postnasal drip. No redness of the oropharynx.  RESPIRATORY: No cough, no wheeze, no hemoptysis. No dyspnea.  CARDIOVASCULAR: + chest pain. No orthopnea. No palpitations. No syncope.  GASTROINTESTINAL: No nausea, no vomiting or diarrhea. No abdominal pain. No melena or hematochezia.  GENITOURINARY: No dysuria or hematuria.  ENDOCRINE: No polyuria or nocturia. No heat or cold intolerance.  HEMATOLOGY: No anemia. No bruising. No bleeding.  INTEGUMENTARY: No rashes. No lesions.  MUSCULOSKELETAL: No arthritis. No swelling. No gout.  NEUROLOGIC: No numbness, tingling, or ataxia. No seizure-type activity.  PSYCHIATRIC: No anxiety. No insomnia. No ADD.    Vitals:   Vitals:   02/09/16 1959 02/10/16 0433 02/10/16 0434 02/10/16 0754  BP: 133/68 (!) 151/104 (!) 168/82 (!) 161/84  Pulse: 74 84 76 77  Resp: 15 17    Temp: 98.7 F (37.1 C) 97.8 F (36.6 C)  98.1 F (36.7 C)  TempSrc: Oral Oral  Oral  SpO2: 95% 97%  98%  Weight:      Height:        Wt Readings from Last 3 Encounters:  02/09/16 246 lb 9.6 oz (111.9 kg)     Intake/Output Summary (Last 24 hours) at 02/10/16 1225 Last data filed at 02/10/16 0865  Gross per 24 hour  Intake              360 ml  Output              1250 ml  Net             -890 ml    Physical Exam:   GENERAL: Pleasant-appearing in no apparent distress.  HEAD, EYES, EARS, NOSE AND THROAT: Atraumatic, normocephalic. Extraocular muscles are intact. Pupils equal and reactive to light. Sclerae anicteric. No conjunctival injection. No oro-pharyngeal erythema.  NECK: Supple. There is no jugular venous distention. No bruits, no lymphadenopathy, no thyromegaly.  HEART: Regular rate and rhythm,. No murmurs, no rubs, no clicks.  LUNGS: Clear to auscultation bilaterally. No rales or rhonchi. No wheezes.  ABDOMEN: Soft, flat, nontender, nondistended. Has good bowel sounds. No hepatosplenomegaly appreciated.  EXTREMITIES: No evidence of any cyanosis, clubbing, or peripheral edema.  +2 pedal and radial pulses bilaterally.  NEUROLOGIC: The patient is alert, awake, and oriented x3 with no focal motor or sensory deficits appreciated bilaterally.  SKIN: Moist and warm with no rashes appreciated.  Psych: Not anxious, depressed LN: No inguinal LN enlargement    Antibiotics   Anti-infectives    None      Medications   Scheduled Meds: . allopurinol  200 mg Oral Daily  . aspirin EC  81 mg Oral Daily  . atorvastatin  40 mg Oral q1800  . carvedilol  25 mg Oral BID WC  . glipiZIDE  2.5 mg Oral Q breakfast  . insulin aspart  0-9 Units Subcutaneous TID WC  . losartan  100 mg Oral Daily  . terazosin  5 mg Oral QHS   Continuous Infusions: . heparin 1,400 Units/hr (02/09/16 1933)   PRN Meds:.acetaminophen, morphine injection, nitroGLYCERIN, ondansetron (ZOFRAN) IV, oxyCODONE-acetaminophen   Data Review:   Micro Results No results found for this or any previous visit (from the past 240 hour(s)).  Radiology Reports Dg Chest 2 View  Result Date: 02/09/2016 CLINICAL DATA:  Left-sided chest pain radiating to both arms beginning at 0045 hours. Hypertension. EXAM: CHEST  2 VIEW COMPARISON:  None. FINDINGS: Shallow inspiration. Normal  heart size and pulmonary vascularity. Diffuse interstitial pattern to the lungs with central predominance. This may represent bronchitic change, fibrosis, or edema. No focal consolidation or airspace disease. No blunting of costophrenic angles. No pneumothorax. Calcification of the aorta. IMPRESSION: Diffuse mostly central interstitial changes to the lungs could indicate bronchitic changes, fibrosis, or edema. No focal consolidation. Normal heart size. Electronically Signed   By: Burman NievesWilliam  Stevens M.D.   On: 02/09/2016 03:48     CBC  Recent Labs Lab 02/09/16 0309 02/09/16 0603  WBC 11.7* 12.9*  HGB 14.2 13.4  HCT 42.3 40.0  PLT 182 172  MCV 88.7 88.9  MCH 29.7 29.7  MCHC 33.5 33.4  RDW 14.9* 15.0*    Chemistries   Recent Labs Lab 02/09/16 0309 02/09/16 0603  NA 141 142  K 4.0 4.0  CL 108 112*  CO2 26 23  GLUCOSE 215* 193*  BUN 40* 41*  CREATININE 2.04* 1.95*  CALCIUM 9.3 9.5   ------------------------------------------------------------------------------------------------------------------ estimated creatinine clearance is 46.2 mL/min (by C-G formula based on SCr of 1.95 mg/dL (H)). ------------------------------------------------------------------------------------------------------------------ No results for input(s): HGBA1C in the last 72 hours. ------------------------------------------------------------------------------------------------------------------  Recent Labs  02/09/16 0603  CHOL 120  HDL 23*  LDLCALC 42  TRIG 161273*  CHOLHDL 5.2   ------------------------------------------------------------------------------------------------------------------ No results for input(s): TSH, T4TOTAL, T3FREE, THYROIDAB in the last 72 hours.  Invalid input(s): FREET3 ------------------------------------------------------------------------------------------------------------------ No results for input(s): VITAMINB12, FOLATE, FERRITIN, TIBC, IRON, RETICCTPCT in the last 72  hours.  Coagulation profile  Recent Labs Lab 02/09/16 0309  INR 1.02    No results for input(s): DDIMER in the last 72 hours.  Cardiac Enzymes  Recent Labs Lab 02/09/16 1240 02/09/16 1921 02/10/16 0148  TROPONINI 6.16* 4.29* 2.46*   ------------------------------------------------------------------------------------------------------------------ Invalid input(s): POCBNP    Assessment & Plan  Patient 70 year old with history of hypertension diabetes chronic kidney disease presents with chest pain 1.  Non-ST elevation MI: Seen by cardiology  Echo was canceled level  reordered echo today Patient will be given IV fluids starting Sunday cardiac catheter on Monday Continue therapy with aspirin and heparin, Coreg  2. Hypertension blood pressure currently stable: Continue therapy with Coreg 3. Type 2 diabetes mellitus: Place patient on sliding scale insulin continue his glipizide 4.  chronic renal failure based on reviewing his previous labs his renal function is similar to outpatient. IV hydration prior to cath     Code Status Orders        Start     Ordered   02/09/16 0553  Full code  Continuous     11 /24/17 0552    Code Status History    Date Active Date Inactive Code Status Order ID Comments User Context   This patient has a current code status but no historical code status.    Advance Directive Documentation   Flowsheet Row Most Recent Value  Type of Advance Directive  Living will, Healthcare Power of Attorney  Pre-existing out of facility DNR order (yellow form or pink MOST form)  No data  "MOST" Form in Place?  No data           Consults  Cardiology   DVT Prophylaxis  heparin  Lab Results  Component Value Date   PLT 172 02/09/2016     Time Spent in minutes  35min  Greater than 50% of time spent in care coordination and counseling patient regarding the condition and plan of care.   Auburn BilberryPATEL, Parul Porcelli M.D on 02/10/2016 at 12:25 PM  Between 7am  to 6pm - Pager - [phone]  After 6pm go to [email or web address] - password [email or web address] [facility]  [facility]   Office  [phone]

## 2016-02-11 LAB — ECHOCARDIOGRAM COMPLETE
E decel time: 176 ms
E/e' ratio: 18.42
FS: 6 % — AB (ref 28–44)
Height: 73 in
IVS/LV PW RATIO, ED: 1.5
LA ID, A-P, ES: 41 mm
LA diam end sys: 41 mm
LA diam index: 1.69 cm/m2
LA vol A4C: 65.6 mL
LV E/e' medial: 18.42
LV E/e'average: 18.42
LV PW d: 7.34 mm — AB (ref 0.6–1.1)
LV dias vol index: 91 mL/m2
LV dias vol: 222 mL — AB (ref 62–150)
LV e' LATERAL: 8.36 cm/s
LV sys vol index: 55 mL/m2
LV sys vol: 133 mL — AB (ref 21–61)
Lateral S' vel: 11.3 cm/s
MV Dec: 176
MV Peak grad: 9 mmHg
MV pk A vel: 72.1 m/s
MV pk E vel: 154 m/s
PV Reg grad dias: 14 mmHg
PV Reg vel dias: 188 cm/s
Simpson's disk: 40
Stroke v: 89 mL
TAPSE: 23.9 mm
TDI e' lateral: 8.36
TDI e' medial: 6.74
Weight: 3945.6 [oz_av]

## 2016-02-11 LAB — CBC
HEMATOCRIT: 39.1 % — AB (ref 40.0–52.0)
Hemoglobin: 13.4 g/dL (ref 13.0–18.0)
MCH: 30.4 pg (ref 26.0–34.0)
MCHC: 34.1 g/dL (ref 32.0–36.0)
MCV: 89.1 fL (ref 80.0–100.0)
Platelets: 149 10*3/uL — ABNORMAL LOW (ref 150–440)
RBC: 4.4 MIL/uL (ref 4.40–5.90)
RDW: 15 % — AB (ref 11.5–14.5)
WBC: 11.1 10*3/uL — AB (ref 3.8–10.6)

## 2016-02-11 LAB — BASIC METABOLIC PANEL
ANION GAP: 5 (ref 5–15)
BUN: 31 mg/dL — AB (ref 6–20)
CALCIUM: 9 mg/dL (ref 8.9–10.3)
CO2: 23 mmol/L (ref 22–32)
Chloride: 112 mmol/L — ABNORMAL HIGH (ref 101–111)
Creatinine, Ser: 1.51 mg/dL — ABNORMAL HIGH (ref 0.61–1.24)
GFR calc Af Amer: 52 mL/min — ABNORMAL LOW (ref >60)
GFR calc non Af Amer: 45 mL/min — ABNORMAL LOW (ref >60)
GLUCOSE: 146 mg/dL — AB (ref 65–99)
Potassium: 3.9 mmol/L (ref 3.5–5.1)
Sodium: 140 mmol/L (ref 135–145)

## 2016-02-11 LAB — GLUCOSE, CAPILLARY
GLUCOSE-CAPILLARY: 105 mg/dL — AB (ref 65–99)
GLUCOSE-CAPILLARY: 98 mg/dL (ref 65–99)
GLUCOSE-CAPILLARY: 108 mg/dL — AB (ref 65–99)
Glucose-Capillary: 134 mg/dL — ABNORMAL HIGH (ref 65–99)

## 2016-02-11 LAB — TROPONIN I: Troponin I: 1.33 ng/mL (ref <0.03)

## 2016-02-11 LAB — HEPARIN LEVEL (UNFRACTIONATED)
HEPARIN UNFRACTIONATED: 0.36 [IU]/mL (ref 0.30–0.70)
Heparin Unfractionated: 0.29 IU/mL — ABNORMAL LOW (ref 0.30–0.70)
Heparin Unfractionated: 0.34 IU/mL (ref 0.30–0.70)

## 2016-02-11 MED ORDER — HEPARIN BOLUS VIA INFUSION
1500.0000 [IU] | Freq: Once | INTRAVENOUS | Status: AC
  Administered 2016-02-11: 1500 [IU] via INTRAVENOUS
  Filled 2016-02-11: qty 1500

## 2016-02-11 MED ORDER — SODIUM CHLORIDE 0.9 % IV SOLN
INTRAVENOUS | Status: DC
  Administered 2016-02-11: 20:00:00 via INTRAVENOUS

## 2016-02-11 NOTE — Progress Notes (Signed)
ANTICOAGULATION CONSULT NOTE - Initial Consult  Pharmacy Consult for heparin Indication: chest pain/ACS  No Known Allergies  Patient Measurements: Height: 6\' 1"  (185.4 cm) Weight: 246 lb 9.6 oz (111.9 kg) IBW/kg (Calculated) : 79.9 Heparin Dosing Weight: 103.9 kg  Vital Signs: Temp: 97.7 F (36.5 C) (11/26 0440) Temp Source: Oral (11/26 0440) BP: 170/82 (11/26 0440) Pulse Rate: 82 (11/26 0440)  Labs:  Recent Labs  02/09/16 0309 02/09/16 0603  02/09/16 1240 02/09/16 1921 02/10/16 0148 02/11/16 0424  HGB 14.2 13.4  --   --   --  12.5* 13.4  HCT 42.3 40.0  --   --   --  38.5* 39.1*  PLT 182 172  --   --   --  150 149*  APTT 31  --   --   --   --   --   --   LABPROT 13.4  --   --   --   --   --   --   INR 1.02  --   --   --   --   --   --   HEPARINUNFRC  --   --   < > 0.24* 0.33 0.34 0.29*  CREATININE 2.04* 1.95*  --   --   --   --   --   TROPONINI 0.28* 2.81*  --  6.16* 4.29* 2.46*  --   < > = values in this interval not displayed.  Estimated Creatinine Clearance: 46.2 mL/min (by C-G formula based on SCr of 1.95 mg/dL (H)).   Medical History: Past Medical History:  Diagnosis Date  . Diabetes mellitus without complication (HCC)   . Hypertension     Medications:  Scheduled:  . allopurinol  200 mg Oral Daily  . aspirin EC  81 mg Oral Daily  . atorvastatin  40 mg Oral q1800  . carvedilol  25 mg Oral BID WC  . glipiZIDE  2.5 mg Oral Q breakfast  . heparin  1,500 Units Intravenous Once  . insulin aspart  0-9 Units Subcutaneous TID WC  . losartan  100 mg Oral Daily  . terazosin  5 mg Oral QHS    Assessment: 70 yom with left sided CP per EMS. Pharmacy consulted to dose heparin for ACS. No AC PTA per RN/med list.  Goal of Therapy:  Heparin level 0.3-0.7 units/ml Monitor platelets by anticoagulation protocol: Yes   Plan: 11/24 HL: subtherapeutic at 0.24 Will order a 1400 unit bolus and increase infusion to heparin 1400units/hr.   Continue to monitor H&H  and platelets  11/24:  HL @ 19:30 = 0.33 Will continue this pt on current rate and recheck HL in 6 hrs on 11/25 @ 0130.   11/25 0148 HL therapeutic x 2. Continue current rate. Pharmacy will continue to monitor daily.   11/26 0424 HL subtherapeutic. 1500 units IV x 1 bolus and increase rate to 1500 units/hr. Will recheck HL in 8 hours.  Carola FrostNathan A Lynette Topete, Pharm.D., BCPS Clinical Pharmacist 02/11/2016,5:22 AM

## 2016-02-11 NOTE — Progress Notes (Signed)
Patient continue on heparin drip, no active bleeding noted. No c/o chest pain. Will continue to monitor.

## 2016-02-11 NOTE — Progress Notes (Signed)
[facility] - Home at [facility]                                                                                                                                                                                  Patient Demographics   James ChimesJames Clayton, is a 70 y.o. male, DOB - February 21, 1946, WGN:[id number]  Admit date - 02/09/2016   Admitting Physician Ihor AustinPavan Pyreddy, MD  Outpatient Primary MD for the patient is [name],[name], DO   LOS - 2  Subjective: Feeling better denies any chest pain or shortness of breath overnight   Review of Systems:   CONSTITUTIONAL: No documented fever. No fatigue, weakness. No weight gain, no weight loss.  EYES: No blurry or double vision.  ENT: No tinnitus. No postnasal drip. No redness of the oropharynx.  RESPIRATORY: No cough, no wheeze, no hemoptysis. No dyspnea.  CARDIOVASCULAR: + chest pain. No orthopnea. No palpitations. No syncope.  GASTROINTESTINAL: No nausea, no vomiting or diarrhea. No abdominal pain. No melena or hematochezia.  GENITOURINARY: No dysuria or hematuria.  ENDOCRINE: No polyuria or nocturia. No heat or cold intolerance.  HEMATOLOGY: No anemia. No bruising. No bleeding.  INTEGUMENTARY: No rashes. No lesions.  MUSCULOSKELETAL: No arthritis. No swelling. No gout.  NEUROLOGIC: No numbness, tingling, or ataxia. No seizure-type activity.  PSYCHIATRIC: No anxiety. No insomnia. No ADD.    Vitals:   Vitals:   02/10/16 1924 02/11/16 0440 02/11/16 0840 02/11/16 1138  BP: (!) 167/83 (!) 170/82 (!) 156/81 (!) 150/77  Pulse: 78 82 79 65  Resp: 17 15    Temp: 98.5 F (36.9 C) 97.7 F (36.5 C) 97.8 F (36.6 C) 98 F (36.7 C)  TempSrc: Oral Oral Oral Oral  SpO2: 97% 97% 95% 97%  Weight:      Height:        Wt Readings from Last 3 Encounters:  02/09/16 246 lb 9.6 oz (111.9 kg)     Intake/Output Summary (Last 24 hours) at 02/11/16 1508 Last data filed at 02/11/16 1040  Gross per 24 hour  Intake              480 ml   Output              675 ml  Net             -195 ml    Physical Exam:   GENERAL: Pleasant-appearing in no apparent distress.  HEAD, EYES, EARS, NOSE AND THROAT: Atraumatic, normocephalic. Extraocular muscles are intact. Pupils equal and reactive to light. Sclerae anicteric. No conjunctival injection. No oro-pharyngeal erythema.  NECK: Supple. There is no jugular venous distention. No bruits, no lymphadenopathy, no thyromegaly.  HEART: Regular rate and rhythm,. No murmurs, no rubs, no clicks.  LUNGS: Clear to auscultation bilaterally. No rales or rhonchi. No wheezes.  ABDOMEN: Soft, flat, nontender, nondistended. Has good bowel sounds. No hepatosplenomegaly appreciated.  EXTREMITIES: No evidence of any cyanosis, clubbing, or peripheral edema.  +2 pedal and radial pulses bilaterally.  NEUROLOGIC: The patient is alert, awake, and oriented x3 with no focal motor or sensory deficits appreciated bilaterally.  SKIN: Moist and warm with no rashes appreciated.  Psych: Not anxious, depressed LN: No inguinal LN enlargement    Antibiotics   Anti-infectives    None      Medications   Scheduled Meds: . allopurinol  200 mg Oral Daily  . aspirin EC  81 mg Oral Daily  . atorvastatin  40 mg Oral q1800  . carvedilol  25 mg Oral BID WC  . glipiZIDE  2.5 mg Oral Q breakfast  . insulin aspart  0-9 Units Subcutaneous TID WC  . losartan  100 mg Oral Daily  . terazosin  5 mg Oral QHS   Continuous Infusions: . sodium chloride    . heparin 1,500 Units/hr (02/11/16 0942)   PRN Meds:.acetaminophen, morphine injection, nitroGLYCERIN, ondansetron (ZOFRAN) IV, oxyCODONE-acetaminophen   Data Review:   Micro Results No results found for this or any previous visit (from the past 240 hour(s)).  Radiology Reports Dg Chest 2 View  Result Date: 02/09/2016 CLINICAL DATA:  Left-sided chest pain radiating to both arms beginning at 0045 hours. Hypertension. EXAM: CHEST  2 VIEW COMPARISON:  None.  FINDINGS: Shallow inspiration. Normal heart size and pulmonary vascularity. Diffuse interstitial pattern to the lungs with central predominance. This may represent bronchitic change, fibrosis, or edema. No focal consolidation or airspace disease. No blunting of costophrenic angles. No pneumothorax. Calcification of the aorta. IMPRESSION: Diffuse mostly central interstitial changes to the lungs could indicate bronchitic changes, fibrosis, or edema. No focal consolidation. Normal heart size. Electronically Signed   By: Burman NievesWilliam  Stevens M.D.   On: 02/09/2016 03:48     CBC  Recent Labs Lab 02/09/16 0309 02/09/16 0603 02/10/16 0148 02/11/16 0424  WBC 11.7* 12.9* 12.4* 11.1*  HGB 14.2 13.4 12.5* 13.4  HCT 42.3 40.0 38.5* 39.1*  PLT 182 172 150 149*  MCV 88.7 88.9 91.0 89.1  MCH 29.7 29.7 29.6 30.4  MCHC 33.5 33.4 32.5 34.1  RDW 14.9* 15.0* 15.9* 15.0*    Chemistries   Recent Labs Lab 02/09/16 0309 02/09/16 0603 02/11/16 0424  NA 141 142 140  K 4.0 4.0 3.9  CL 108 112* 112*  CO2 26 23 23   GLUCOSE 215* 193* 146*  BUN 40* 41* 31*  CREATININE 2.04* 1.95* 1.51*  CALCIUM 9.3 9.5 9.0   ------------------------------------------------------------------------------------------------------------------ estimated creatinine clearance is 59.7 mL/min (by C-G formula based on SCr of 1.51 mg/dL (H)). ------------------------------------------------------------------------------------------------------------------ No results for input(s): HGBA1C in the last 72 hours. ------------------------------------------------------------------------------------------------------------------  Recent Labs  02/09/16 0603  CHOL 120  HDL 23*  LDLCALC 42  TRIG 161273*  CHOLHDL 5.2   ------------------------------------------------------------------------------------------------------------------ No results for input(s): TSH, T4TOTAL, T3FREE, THYROIDAB in the last 72 hours.  Invalid input(s):  FREET3 ------------------------------------------------------------------------------------------------------------------ No results for input(s): VITAMINB12, FOLATE, FERRITIN, TIBC, IRON, RETICCTPCT in the last 72 hours.  Coagulation profile  Recent Labs Lab 02/09/16 0309  INR 1.02    No results for input(s): DDIMER in the last 72 hours.  Cardiac Enzymes  Recent Labs Lab 02/09/16 1921 02/10/16 0148 02/11/16 0424  TROPONINI 4.29* 2.46* 1.33*   ------------------------------------------------------------------------------------------------------------------ Ignacia FellingInvalid  input(s): POCBNP    Assessment & Plan  Patient 70 year old with history of hypertension diabetes chronic kidney disease presents with chest pain 1.  Non-ST elevation MI: Seen by cardiology   Patient will be given IV fluids starting Sunday cardiac catheter on Monday Continue therapy with aspirin and heparin, Coreg Echo results for review show some diastolic dysfunction with inferior wall hypokinesis and some mitral valve disease 2. Hypertension blood pressure currently stable: Continue therapy with Coreg 3. Type 2 diabetes mellitus: Place patient on sliding scale insulin continue his glipizide 4.  chronic renal failure based on reviewing his previous labs his renal function is stable will be hydrated prior to catheter tomorrow     Code Status Orders        Start     Ordered   02/09/16 0553  Full code  Continuous     11 /24/17 0552    Code Status History    Date Active Date Inactive Code Status Order ID Comments User Context   This patient has a current code status but no historical code status.    Advance Directive Documentation   Flowsheet Row Most Recent Value  Type of Advance Directive  Living will, Healthcare Power of Attorney  Pre-existing out of facility DNR order (yellow form or pink MOST form)  No data  "MOST" Form in Place?  No data           Consults  Cardiology   DVT Prophylaxis   heparin  Lab Results  Component Value Date   PLT 149 (L) 02/11/2016     Time Spent in minutes   Greater than 50% of time spent in care coordination and counseling patient regarding the condition and plan of care.   Auburn Bilberry M.D on 02/11/2016 at 3:08 PM  Between 7am to 6pm - Pager - [phone]  After 6pm go to [email or web address] - password [email or web address] [facility]  [facility]   Office  [phone]

## 2016-02-11 NOTE — Progress Notes (Signed)
Patient Name: James ChimesJames Fan Date of Encounter: 02/11/2016  Primary Cardiologist: Dr. Emi HolesArida  Hospital Problem List     Principal Problem:   Non-STEMI (non-ST elevated myocardial infarction) [facility](HCC) Active Problems:   Diabetes mellitus without complication (HCC)   Hypertension   Hyperlipidemia     Subjective   Patient is doing well. No recurrence of chest pain. No SOB, palpitations, PND, orthopnea.   Inpatient Medications    Scheduled Meds: . allopurinol  200 mg Oral Daily  . aspirin EC  81 mg Oral Daily  . atorvastatin  40 mg Oral q1800  . carvedilol  25 mg Oral BID WC  . glipiZIDE  2.5 mg Oral Q breakfast  . insulin aspart  0-9 Units Subcutaneous TID WC  . losartan  100 mg Oral Daily  . terazosin  5 mg Oral QHS   Continuous Infusions: . sodium chloride    . heparin 1,500 Units/hr (02/11/16 0942)   PRN Meds: acetaminophen, morphine injection, nitroGLYCERIN, ondansetron (ZOFRAN) IV, oxyCODONE-acetaminophen   Vital Signs    Vitals:   02/10/16 1751 02/10/16 1924 02/11/16 0440 02/11/16 0840  BP: (!) 166/76 (!) 167/83 (!) 170/82 (!) 156/81  Pulse: 72 78 82 79  Resp:  17 15   Temp:  98.5 F (36.9 C) 97.7 F (36.5 C) 97.8 F (36.6 C)  TempSrc:  Oral Oral Oral  SpO2:  97% 97% 95%  Weight:      Height:        Intake/Output Summary (Last 24 hours) at 02/11/16 1056 Last data filed at 02/11/16 1040  Gross per 24 hour  Intake              480 ml  Output             1200 ml  Net             -720 ml   Filed Weights   02/09/16 0310 02/09/16 0600  Weight: 250 lb (113.4 kg) 246 lb 9.6 oz (111.9 kg)   Body mass index is 32.53 kg/m.   Physical Exam  GENERAL:  well developed, well nourished, obese, not in acute distress HEENT: normocephalic, pink conjunctivae, anicteric sclerae, no xanthelasma, normal dentition, oropharynx clear NECK:  no neck vein engorgement, JVP normal, no hepatojugular reflux, carotid upstroke brisk and symmetric, no bruit, no thyromegaly,  no lymphadenopathy LUNGS:  good respiratory effort, clear to auscultation bilaterally CV:  PMI not displaced, no thrills, no lifts, S1 and S2 within normal limits, no palpable S3 or S4, no murmurs, no rubs, no gallops ABD:  Soft, nontender, nondistended, normoactive bowel sounds, no abdominal aortic bruit, no hepatomegaly, no splenomegaly MS: nontender back, no kyphosis, no scoliosis, no joint deformities EXT:  2+ DP/PT pulses, trace edema, no varicosities, no cyanosis, no clubbing SKIN: warm, nondiaphoretic, normal turgor, no ulcers NEUROPSYCH: alert, oriented to person, place, and time, sensory/motor grossly intact, normal mood, appropriate affect    Labs    CBC  Recent Labs  02/10/16 0148 02/11/16 0424  WBC 12.4* 11.1*  HGB 12.5* 13.4  HCT 38.5* 39.1*  MCV 91.0 89.1  PLT 150 149*   Basic Metabolic Panel  Recent Labs  02/09/16 0603 02/11/16 0424  NA 142 140  K 4.0 3.9  CL 112* 112*  CO2 23 23  GLUCOSE 193* 146*  BUN 41* 31*  CREATININE 1.95* 1.51*  CALCIUM 9.5 9.0   Liver Function Tests No results for input(s): AST, ALT, ALKPHOS, BILITOT, PROT, ALBUMIN in the last 72  hours. No results for input(s): LIPASE, AMYLASE in the last 72 hours. Cardiac Enzymes  Recent Labs  02/09/16 1921 02/10/16 0148 02/11/16 0424  TROPONINI 4.29* 2.46* 1.33*   BNP Invalid input(s): POCBNP D-Dimer No results for input(s): DDIMER in the last 72 hours. Hemoglobin A1C No results for input(s): HGBA1C in the last 72 hours. Fasting Lipid Panel  Recent Labs  02/09/16 0603  CHOL 120  HDL 23*  LDLCALC 42  TRIG 273*  CHOLHDL 5.2   Thyroid Function Tests No results for input(s): TSH, T4TOTAL, T3FREE, THYROIDAB in the last 72 hours.  Invalid input(s): FREET3  Telemetry    SR - Personally Reviewed  ECG    02/09/2016: SR 84 - Personally Reviewed  Radiology    No results found.  Cardiac Studies   02/11/2016: Left ventricle: The cavity size was normal. Wall thickness  was   normal. Systolic function was normal. The estimated ejection   fraction was in the range of 50% to 55%. There is mild   hypokinesis of the inferior segments. Features are consistent   with a pseudonormal left ventricular filling pattern, with   concomitant abnormal relaxation and increased filling pressure   (grade 2 diastolic dysfunction). - Mitral valve: There was mild to moderate regurgitation. - Pulmonary arteries: Systolic pressure was mildly to moderately   increased. PA peak pressure: 46 mm Hg (S).   Patient Profile     Akash Euceda is a 70 y.o. male with history of diabetes mellitus, hypertension, hyperlipidemia and chronic kidney disease. He presented with CP and diagnosed with NSTEMI  Assessment & Plan    Non-ST elevation myocardial infarction No CP currently.  Continue with unfractionated heparin, daily aspirin and atorvastatin.  Will add Imdur ER 30 mg po qd for blood pressure and vasodilation.  LV EF on echo low normal, mild hypokinesis of the inferior segments. Cardiac catheterization and possible coronary intervention on 02/12/2016 c/o Dr. Arida. The patient will require at least 12 hours of hydration before the procedure in order to minimize the risk of contrast-induced nephropathy -- orders palced by Dr. Arida 11/24.  Essential hypertension Will add Imdur as above.Continue treatment with carvedilol and losartan.  Hyperlipidemia On atorvastatin 40 mg QHS.. LDL < 70 goal. Lifestyle changes recommended.   May benefit from sleep study as outpatient -- heavy snoring, HTN on mult meds, obesity.   I spent at least 35 minutes with the patient today and more than 50% of the time was spent counseling the patient and coordinating care.     Signed, Nikaela Coyne, MD, FACC 02/11/2016, 10:56 AM   

## 2016-02-11 NOTE — Progress Notes (Signed)
ANTICOAGULATION CONSULT NOTE - Initial Consult  Pharmacy Consult for heparin Indication: chest pain/ACS  No Known Allergies  Patient Measurements: Height: 6\' 1"  (185.4 cm) Weight: 246 lb 9.6 oz (111.9 kg) IBW/kg (Calculated) : 79.9 Heparin Dosing Weight: 103.9 kg  Vital Signs: Temp: 98 F (36.7 C) (11/26 1138) Temp Source: Oral (11/26 1138) BP: 150/77 (11/26 1138) Pulse Rate: 65 (11/26 1138)  Labs:  Recent Labs  02/09/16 0309 02/09/16 0603  02/09/16 1921 02/10/16 0148 02/11/16 0424 02/11/16 1346  HGB 14.2 13.4  --   --  12.5* 13.4  --   HCT 42.3 40.0  --   --  38.5* 39.1*  --   PLT 182 172  --   --  150 149*  --   APTT 31  --   --   --   --   --   --   LABPROT 13.4  --   --   --   --   --   --   INR 1.02  --   --   --   --   --   --   HEPARINUNFRC  --   --   < > 0.33 0.34 0.29* 0.36  CREATININE 2.04* 1.95*  --   --   --  1.51*  --   TROPONINI 0.28* 2.81*  < > 4.29* 2.46* 1.33*  --   < > = values in this interval not displayed.  Estimated Creatinine Clearance: 59.7 mL/min (by C-G formula based on SCr of 1.51 mg/dL (H)).   Medical History: Past Medical History:  Diagnosis Date  . Diabetes mellitus without complication (HCC)   . Hypertension     Medications:  Scheduled:  . allopurinol  200 mg Oral Daily  . aspirin EC  81 mg Oral Daily  . atorvastatin  40 mg Oral q1800  . carvedilol  25 mg Oral BID WC  . glipiZIDE  2.5 mg Oral Q breakfast  . insulin aspart  0-9 Units Subcutaneous TID WC  . losartan  100 mg Oral Daily  . terazosin  5 mg Oral QHS    Assessment: 70 yom with left sided CP per EMS. Pharmacy consulted to dose heparin for ACS. No AC PTA per RN/med list.  Goal of Therapy:  Heparin level 0.3-0.7 units/ml Monitor platelets by anticoagulation protocol: Yes   Plan: 1126 @ 1346 HL = 0.36 (therapeutic). Will continue heparin drip at current rate of 1500 units/hr and order confirmatory HL in 6 hours.  CBC ordered with AM labs tomorrow.  James Clayton  James Clayton, Pharm.D., BCPS Clinical Pharmacist 02/11/2016,3:03 PM

## 2016-02-11 NOTE — Progress Notes (Signed)
ANTICOAGULATION CONSULT NOTE - Initial Consult  Pharmacy Consult for heparin Indication: chest pain/ACS  No Known Allergies  Patient Measurements: Height: 6\' 1"  (185.4 cm) Weight: 246 lb 9.6 oz (111.9 kg) IBW/kg (Calculated) : 79.9 Heparin Dosing Weight: 103.9 kg  Vital Signs: Temp: 98.1 F (36.7 C) (11/26 1942) Temp Source: Oral (11/26 1942) BP: 145/84 (11/26 1942) Pulse Rate: 71 (11/26 1942)  Labs:  Recent Labs  02/09/16 0309 02/09/16 0603  02/09/16 1921 02/10/16 0148 02/11/16 0424 02/11/16 1346 02/11/16 2051  HGB 14.2 13.4  --   --  12.5* 13.4  --   --   HCT 42.3 40.0  --   --  38.5* 39.1*  --   --   PLT 182 172  --   --  150 149*  --   --   APTT 31  --   --   --   --   --   --   --   LABPROT 13.4  --   --   --   --   --   --   --   INR 1.02  --   --   --   --   --   --   --   HEPARINUNFRC  --   --   < > 0.33 0.34 0.29* 0.36 0.34  CREATININE 2.04* 1.95*  --   --   --  1.51*  --   --   TROPONINI 0.28* 2.81*  < > 4.29* 2.46* 1.33*  --   --   < > = values in this interval not displayed.  Estimated Creatinine Clearance: 59.7 mL/min (by C-G formula based on SCr of 1.51 mg/dL (H)).   Medical History: Past Medical History:  Diagnosis Date  . Diabetes mellitus without complication (HCC)   . Hypertension     Medications:  Scheduled:  . allopurinol  200 mg Oral Daily  . aspirin EC  81 mg Oral Daily  . atorvastatin  40 mg Oral q1800  . carvedilol  25 mg Oral BID WC  . glipiZIDE  2.5 mg Oral Q breakfast  . insulin aspart  0-9 Units Subcutaneous TID WC  . losartan  100 mg Oral Daily  . terazosin  5 mg Oral QHS    Assessment: 70 yom with left sided CP per EMS. Pharmacy consulted to dose heparin for ACS. No AC PTA per RN/med list.  Goal of Therapy:  Heparin level 0.3-0.7 units/ml Monitor platelets by anticoagulation protocol: Yes   Plan: 1126 @ 1346 HL = 0.36 (therapeutic). Will continue heparin drip at current rate of 1500 units/hr and order confirmatory  HL in 6 hours.  11/26 20:51 HL = 0.34. Will continue with current rate of 1500 units/hr. Next HL with AM labs.  CBC ordered with AM labs tomorrow.  Clovia CuffLisa Demarko Zeimet, PharmD, BCPS 02/11/2016 9:44 PM

## 2016-02-12 ENCOUNTER — Inpatient Hospital Stay (HOSPITAL_COMMUNITY): Payer: Medicare Other

## 2016-02-12 ENCOUNTER — Encounter (HOSPITAL_COMMUNITY): Payer: Self-pay | Admitting: Physician Assistant

## 2016-02-12 ENCOUNTER — Other Ambulatory Visit: Payer: Self-pay | Admitting: *Deleted

## 2016-02-12 ENCOUNTER — Encounter
Admission: EM | Disposition: A | Discharge status: Other Acute Inpatient Hospital | Payer: Self-pay | Source: Home / Self Care | Attending: Internal Medicine

## 2016-02-12 ENCOUNTER — Inpatient Hospital Stay (HOSPITAL_COMMUNITY)
Admission: AD | Admit: 2016-02-12 | Discharge: 2016-02-26 | DRG: 236 | Disposition: A | Discharge status: Home / Self Care | Payer: Medicare Other | Source: Other Acute Inpatient Hospital | Attending: Cardiothoracic Surgery | Admitting: Cardiothoracic Surgery

## 2016-02-12 DIAGNOSIS — Z6834 Body mass index (BMI) 34.0-34.9, adult: Secondary | ICD-10-CM | POA: Diagnosis not present

## 2016-02-12 DIAGNOSIS — E669 Obesity, unspecified: Secondary | ICD-10-CM | POA: Diagnosis present

## 2016-02-12 DIAGNOSIS — I340 Nonrheumatic mitral (valve) insufficiency: Secondary | ICD-10-CM | POA: Diagnosis present

## 2016-02-12 DIAGNOSIS — K567 Ileus, unspecified: Secondary | ICD-10-CM | POA: Diagnosis not present

## 2016-02-12 DIAGNOSIS — I2510 Atherosclerotic heart disease of native coronary artery without angina pectoris: Secondary | ICD-10-CM | POA: Diagnosis present

## 2016-02-12 DIAGNOSIS — J9811 Atelectasis: Secondary | ICD-10-CM | POA: Diagnosis not present

## 2016-02-12 DIAGNOSIS — N179 Acute kidney failure, unspecified: Secondary | ICD-10-CM | POA: Diagnosis present

## 2016-02-12 DIAGNOSIS — M109 Gout, unspecified: Secondary | ICD-10-CM | POA: Diagnosis present

## 2016-02-12 DIAGNOSIS — R079 Chest pain, unspecified: Secondary | ICD-10-CM | POA: Diagnosis present

## 2016-02-12 DIAGNOSIS — R0602 Shortness of breath: Secondary | ICD-10-CM

## 2016-02-12 DIAGNOSIS — N183 Chronic kidney disease, stage 3 (moderate): Secondary | ICD-10-CM | POA: Diagnosis present

## 2016-02-12 DIAGNOSIS — I214 Non-ST elevation (NSTEMI) myocardial infarction: Secondary | ICD-10-CM | POA: Diagnosis present

## 2016-02-12 DIAGNOSIS — D62 Acute posthemorrhagic anemia: Secondary | ICD-10-CM | POA: Diagnosis not present

## 2016-02-12 DIAGNOSIS — E1122 Type 2 diabetes mellitus with diabetic chronic kidney disease: Secondary | ICD-10-CM | POA: Diagnosis present

## 2016-02-12 DIAGNOSIS — Z7982 Long term (current) use of aspirin: Secondary | ICD-10-CM

## 2016-02-12 DIAGNOSIS — Z419 Encounter for procedure for purposes other than remedying health state, unspecified: Secondary | ICD-10-CM

## 2016-02-12 DIAGNOSIS — I10 Essential (primary) hypertension: Secondary | ICD-10-CM | POA: Diagnosis present

## 2016-02-12 DIAGNOSIS — E785 Hyperlipidemia, unspecified: Secondary | ICD-10-CM | POA: Diagnosis present

## 2016-02-12 DIAGNOSIS — Z87891 Personal history of nicotine dependence: Secondary | ICD-10-CM | POA: Diagnosis not present

## 2016-02-12 DIAGNOSIS — Z79899 Other long term (current) drug therapy: Secondary | ICD-10-CM | POA: Diagnosis not present

## 2016-02-12 DIAGNOSIS — I252 Old myocardial infarction: Secondary | ICD-10-CM

## 2016-02-12 DIAGNOSIS — Z01810 Encounter for preprocedural cardiovascular examination: Secondary | ICD-10-CM | POA: Diagnosis not present

## 2016-02-12 DIAGNOSIS — Z951 Presence of aortocoronary bypass graft: Secondary | ICD-10-CM

## 2016-02-12 DIAGNOSIS — I5032 Chronic diastolic (congestive) heart failure: Secondary | ICD-10-CM | POA: Diagnosis present

## 2016-02-12 DIAGNOSIS — N5089 Other specified disorders of the male genital organs: Secondary | ICD-10-CM | POA: Diagnosis not present

## 2016-02-12 DIAGNOSIS — I25110 Atherosclerotic heart disease of native coronary artery with unstable angina pectoris: Secondary | ICD-10-CM | POA: Diagnosis present

## 2016-02-12 DIAGNOSIS — I480 Paroxysmal atrial fibrillation: Secondary | ICD-10-CM | POA: Diagnosis not present

## 2016-02-12 DIAGNOSIS — Z7984 Long term (current) use of oral hypoglycemic drugs: Secondary | ICD-10-CM | POA: Diagnosis not present

## 2016-02-12 DIAGNOSIS — E118 Type 2 diabetes mellitus with unspecified complications: Secondary | ICD-10-CM | POA: Diagnosis present

## 2016-02-12 DIAGNOSIS — D72829 Elevated white blood cell count, unspecified: Secondary | ICD-10-CM | POA: Diagnosis present

## 2016-02-12 DIAGNOSIS — I130 Hypertensive heart and chronic kidney disease with heart failure and stage 1 through stage 4 chronic kidney disease, or unspecified chronic kidney disease: Secondary | ICD-10-CM | POA: Diagnosis present

## 2016-02-12 HISTORY — DX: Type 2 diabetes mellitus with unspecified complications: E11.8

## 2016-02-12 HISTORY — DX: Chronic kidney disease, stage 3 unspecified: N18.30

## 2016-02-12 HISTORY — DX: Atherosclerotic heart disease of native coronary artery without angina pectoris: I25.10

## 2016-02-12 HISTORY — DX: Hyperlipidemia, unspecified: E78.5

## 2016-02-12 HISTORY — PX: CARDIAC CATHETERIZATION: SHX172

## 2016-02-12 HISTORY — DX: Chronic diastolic (congestive) heart failure: I50.32

## 2016-02-12 HISTORY — DX: Chronic kidney disease, stage 3 (moderate): N18.3

## 2016-02-12 LAB — URINE MICROSCOPIC-ADD ON
Bacteria, UA: NONE SEEN
RBC / HPF: NONE SEEN RBC/hpf (ref 0–5)

## 2016-02-12 LAB — URINALYSIS, ROUTINE W REFLEX MICROSCOPIC
Bilirubin Urine: NEGATIVE
Glucose, UA: NEGATIVE mg/dL
Hgb urine dipstick: NEGATIVE
Ketones, ur: NEGATIVE mg/dL
Leukocytes, UA: NEGATIVE
Nitrite: NEGATIVE
Protein, ur: 30 mg/dL — AB
Specific Gravity, Urine: 1.023 (ref 1.005–1.030)
pH: 5 (ref 5.0–8.0)

## 2016-02-12 LAB — VAS US DOPPLER PRE CABG
LEFT ECA DIAS: -9 cm/s
Left CCA dist dias: 16 cm/s
Left CCA dist sys: 72 cm/s
Left CCA prox dias: 20 cm/s
Left CCA prox sys: 124 cm/s
Left ICA dist dias: -32 cm/s
Left ICA dist sys: -76 cm/s
Left ICA prox dias: -27 cm/s
Left ICA prox sys: -76 cm/s
RIGHT ECA DIAS: -9 cm/s
RIGHT VERTEBRAL DIAS: 14 cm/s
Right CCA prox dias: 14 cm/s
Right CCA prox sys: 93 cm/s
Right cca dist sys: -73 cm/s

## 2016-02-12 LAB — SPIROMETRY WITH GRAPH
FEF 25-75 Post: 2.88 L/sec
FEF 25-75 Pre: 2.42 L/sec
FEF2575-%Change-Post: 19 %
FEF2575-%Pred-Post: 105 %
FEF2575-%Pred-Pre: 88 %
FEV1-%Change-Post: 12 %
FEV1-%Pred-Post: 79 %
FEV1-%Pred-Pre: 71 %
FEV1-Post: 2.9 L
FEV1-Pre: 2.59 L
FEV1FVC-%Change-Post: 11 %
FEV1FVC-%Pred-Pre: 96 %
FEV6-%Change-Post: 2 %
FEV6-%Pred-Post: 74 %
FEV6-%Pred-Pre: 72 %
FEV6-Post: 3.48 L
FEV6-Pre: 3.38 L
FEV6FVC-%Change-Post: 0 %
FEV6FVC-%Pred-Post: 105 %
FEV6FVC-%Pred-Pre: 105 %
FVC-%Change-Post: 0 %
FVC-%Pred-Post: 73 %
FVC-%Pred-Pre: 73 %
FVC-Post: 3.63 L
FVC-Pre: 3.61 L
Post FEV1/FVC ratio: 80 %
Post FEV6/FVC ratio: 100 %
Pre FEV1/FVC ratio: 72 %
Pre FEV6/FVC Ratio: 100 %

## 2016-02-12 LAB — CBC
HEMATOCRIT: 37.9 % — AB (ref 40.0–52.0)
HEMOGLOBIN: 13 g/dL (ref 13.0–18.0)
MCH: 30.4 pg (ref 26.0–34.0)
MCHC: 34.2 g/dL (ref 32.0–36.0)
MCV: 89 fL (ref 80.0–100.0)
Platelets: 149 10*3/uL — ABNORMAL LOW (ref 150–440)
RBC: 4.26 MIL/uL — ABNORMAL LOW (ref 4.40–5.90)
RDW: 15 % — ABNORMAL HIGH (ref 11.5–14.5)
WBC: 10.2 10*3/uL (ref 3.8–10.6)

## 2016-02-12 LAB — BASIC METABOLIC PANEL
ANION GAP: 6 (ref 5–15)
BUN: 30 mg/dL — ABNORMAL HIGH (ref 6–20)
CALCIUM: 8.6 mg/dL — AB (ref 8.9–10.3)
CHLORIDE: 113 mmol/L — AB (ref 101–111)
CO2: 21 mmol/L — AB (ref 22–32)
Creatinine, Ser: 1.69 mg/dL — ABNORMAL HIGH (ref 0.61–1.24)
GFR calc Af Amer: 46 mL/min — ABNORMAL LOW (ref >60)
GFR calc non Af Amer: 39 mL/min — ABNORMAL LOW (ref >60)
GLUCOSE: 123 mg/dL — AB (ref 65–99)
Potassium: 4.3 mmol/L (ref 3.5–5.1)
Sodium: 140 mmol/L (ref 135–145)

## 2016-02-12 LAB — BLOOD GAS, ARTERIAL
Acid-base deficit: 2.5 mmol/L — ABNORMAL HIGH (ref 0.0–2.0)
Bicarbonate: 21.2 mmol/L (ref 20.0–28.0)
Drawn by: 27407
FIO2: 21
O2 Saturation: 94.3 %
Patient temperature: 98.6
pCO2 arterial: 33.3 mmHg (ref 32.0–48.0)
pH, Arterial: 7.421 (ref 7.350–7.450)
pO2, Arterial: 74.5 mmHg — ABNORMAL LOW (ref 83.0–108.0)

## 2016-02-12 LAB — HEPARIN LEVEL (UNFRACTIONATED): HEPARIN UNFRACTIONATED: 0.3 [IU]/mL (ref 0.30–0.70)

## 2016-02-12 LAB — PREPARE RBC (CROSSMATCH)

## 2016-02-12 LAB — COMPREHENSIVE METABOLIC PANEL
ALT: 41 U/L (ref 17–63)
AST: 64 U/L — ABNORMAL HIGH (ref 15–41)
Albumin: 3 g/dL — ABNORMAL LOW (ref 3.5–5.0)
Alkaline Phosphatase: 73 U/L (ref 38–126)
Anion gap: 6 (ref 5–15)
BUN: 24 mg/dL — ABNORMAL HIGH (ref 6–20)
CO2: 23 mmol/L (ref 22–32)
Calcium: 8.5 mg/dL — ABNORMAL LOW (ref 8.9–10.3)
Chloride: 112 mmol/L — ABNORMAL HIGH (ref 101–111)
Creatinine, Ser: 1.63 mg/dL — ABNORMAL HIGH (ref 0.61–1.24)
GFR calc Af Amer: 48 mL/min — ABNORMAL LOW (ref >60)
GFR calc non Af Amer: 41 mL/min — ABNORMAL LOW (ref >60)
Glucose, Bld: 127 mg/dL — ABNORMAL HIGH (ref 65–99)
Potassium: 4.7 mmol/L (ref 3.5–5.1)
Sodium: 141 mmol/L (ref 135–145)
Total Bilirubin: 1.3 mg/dL — ABNORMAL HIGH (ref 0.3–1.2)
Total Protein: 5.2 g/dL — ABNORMAL LOW (ref 6.5–8.1)

## 2016-02-12 LAB — PROTIME-INR
INR: 1.1
Prothrombin Time: 14.2 seconds (ref 11.4–15.2)

## 2016-02-12 LAB — APTT: aPTT: 32 seconds (ref 24–36)

## 2016-02-12 LAB — GLUCOSE, CAPILLARY
GLUCOSE-CAPILLARY: 112 mg/dL — AB (ref 65–99)
GLUCOSE-CAPILLARY: 136 mg/dL — AB (ref 65–99)
Glucose-Capillary: 99 mg/dL (ref 65–99)

## 2016-02-12 LAB — SURGICAL PCR SCREEN
MRSA, PCR: NEGATIVE
Staphylococcus aureus: NEGATIVE

## 2016-02-12 SURGERY — LEFT HEART CATH AND CORONARY ANGIOGRAPHY
Anesthesia: Moderate Sedation

## 2016-02-12 MED ORDER — ACETAMINOPHEN 325 MG PO TABS: 650.0000 mg | ORAL_TABLET | ORAL | Status: DC | PRN

## 2016-02-12 MED ORDER — DEXTROSE 5 % IV SOLN
750.0000 mg | INTRAVENOUS | Status: DC
  Filled 2016-02-12: qty 750

## 2016-02-12 MED ORDER — NITROGLYCERIN IN D5W 200-5 MCG/ML-% IV SOLN
2.0000 ug/min | INTRAVENOUS | Status: DC
  Filled 2016-02-12: qty 250

## 2016-02-12 MED ORDER — DEXTROSE 5 % IV SOLN
1.5000 g | INTRAVENOUS | Status: AC
  Administered 2016-02-13: 1.5 g via INTRAVENOUS
  Filled 2016-02-12: qty 1.5

## 2016-02-12 MED ORDER — HEPARIN (PORCINE) IN NACL 100-0.45 UNIT/ML-% IJ SOLN
1900.0000 [IU]/h | INTRAMUSCULAR | Status: DC
  Administered 2016-02-12: 1600 [IU]/h via INTRAVENOUS
  Filled 2016-02-12: qty 250

## 2016-02-12 MED ORDER — METOPROLOL TARTRATE 12.5 MG HALF TABLET
12.5000 mg | ORAL_TABLET | Freq: Once | ORAL | Status: AC
  Administered 2016-02-13: 12.5 mg via ORAL
  Filled 2016-02-12: qty 1

## 2016-02-12 MED ORDER — OXYCODONE-ACETAMINOPHEN 5-325 MG PO TABS: 1.0000 | ORAL_TABLET | Freq: Four times a day (QID) | ORAL | Status: DC | PRN

## 2016-02-12 MED ORDER — SODIUM CHLORIDE 0.9 % IV SOLN: 2.0000 g/h | Freq: Once | INTRAVENOUS | Status: DC

## 2016-02-12 MED ORDER — HEPARIN SODIUM (PORCINE) 1000 UNIT/ML IJ SOLN
INTRAMUSCULAR | Status: DC | PRN
  Administered 2016-02-12: 6000 [IU] via INTRAVENOUS

## 2016-02-12 MED ORDER — VERAPAMIL HCL 2.5 MG/ML IV SOLN
INTRAVENOUS | Status: AC
  Filled 2016-02-12: qty 2

## 2016-02-12 MED ORDER — CHLORHEXIDINE GLUCONATE 0.12 % MT SOLN
15.0000 mL | Freq: Once | OROMUCOSAL | Status: AC
  Administered 2016-02-13: 15 mL via OROMUCOSAL
  Filled 2016-02-12: qty 15

## 2016-02-12 MED ORDER — MIDAZOLAM HCL 2 MG/2ML IJ SOLN
INTRAMUSCULAR | Status: AC
  Filled 2016-02-12: qty 2

## 2016-02-12 MED ORDER — TRANEXAMIC ACID (OHS) PUMP PRIME SOLUTION
2.0000 mg/kg | INTRAVENOUS | Status: DC
  Filled 2016-02-12: qty 2.23

## 2016-02-12 MED ORDER — TEMAZEPAM 15 MG PO CAPS: 15.0000 mg | ORAL_CAPSULE | Freq: Once | ORAL | Status: DC | PRN

## 2016-02-12 MED ORDER — ASPIRIN EC 81 MG PO TBEC: 81.0000 mg | DELAYED_RELEASE_TABLET | Freq: Every day | ORAL | Status: DC

## 2016-02-12 MED ORDER — PLASMA-LYTE 148 IV SOLN
INTRAVENOUS | Status: AC
  Administered 2016-02-13: 500 mL
  Filled 2016-02-12: qty 2.5

## 2016-02-12 MED ORDER — ONDANSETRON HCL 4 MG/2ML IJ SOLN: 4.0000 mg | Freq: Four times a day (QID) | INTRAMUSCULAR | Status: DC | PRN

## 2016-02-12 MED ORDER — FENTANYL CITRATE (PF) 100 MCG/2ML IJ SOLN
INTRAMUSCULAR | Status: DC | PRN
  Administered 2016-02-12: 50 ug via INTRAVENOUS

## 2016-02-12 MED ORDER — SODIUM CHLORIDE 0.9 % IV SOLN
INTRAVENOUS | Status: DC
  Filled 2016-02-12: qty 30

## 2016-02-12 MED ORDER — SODIUM CHLORIDE 0.9 % IV SOLN: 250.0000 mL | INTRAVENOUS | Status: DC | PRN

## 2016-02-12 MED ORDER — VANCOMYCIN HCL 10 G IV SOLR
1250.0000 mg | INTRAVENOUS | Status: AC
  Administered 2016-02-13: 1250 mg via INTRAVENOUS
  Filled 2016-02-12: qty 1250

## 2016-02-12 MED ORDER — TERAZOSIN HCL 5 MG PO CAPS
5.0000 mg | ORAL_CAPSULE | Freq: Every day | ORAL | Status: DC
  Administered 2016-02-12: 5 mg via ORAL
  Filled 2016-02-12: qty 1

## 2016-02-12 MED ORDER — GLIPIZIDE ER 2.5 MG PO TB24
2.5000 mg | ORAL_TABLET | Freq: Every day | ORAL | Status: DC
  Filled 2016-02-12: qty 1

## 2016-02-12 MED ORDER — TRANEXAMIC ACID 1000 MG/10ML IV SOLN
1.5000 mg/kg/h | INTRAVENOUS | Status: AC
  Administered 2016-02-13: 1.5 mg/kg/h via INTRAVENOUS
  Filled 2016-02-12: qty 25

## 2016-02-12 MED ORDER — HEPARIN (PORCINE) IN NACL 2-0.9 UNIT/ML-% IJ SOLN
INTRAMUSCULAR | Status: AC
  Filled 2016-02-12: qty 500

## 2016-02-12 MED ORDER — CARVEDILOL 25 MG PO TABS
25.0000 mg | ORAL_TABLET | Freq: Two times a day (BID) | ORAL | Status: DC
  Administered 2016-02-12 – 2016-02-13 (×2): 25 mg via ORAL
  Filled 2016-02-12 (×2): qty 1

## 2016-02-12 MED ORDER — MIDAZOLAM HCL 2 MG/2ML IJ SOLN
INTRAMUSCULAR | Status: DC | PRN
  Administered 2016-02-12: 2 mg via INTRAVENOUS

## 2016-02-12 MED ORDER — ALPRAZOLAM 0.25 MG PO TABS: 0.2500 mg | ORAL_TABLET | ORAL | Status: DC | PRN

## 2016-02-12 MED ORDER — SODIUM CHLORIDE 0.9% FLUSH: 3.0000 mL | INTRAVENOUS | Status: DC | PRN

## 2016-02-12 MED ORDER — ISOSORBIDE MONONITRATE ER 30 MG PO TB24
30.0000 mg | ORAL_TABLET | Freq: Every day | ORAL | Status: DC
  Administered 2016-02-12: 30 mg via ORAL
  Filled 2016-02-12: qty 1

## 2016-02-12 MED ORDER — PHENYLEPHRINE HCL 10 MG/ML IJ SOLN
30.0000 ug/min | INTRAMUSCULAR | Status: AC
  Administered 2016-02-13: 20 ug/min via INTRAVENOUS
  Filled 2016-02-12: qty 2

## 2016-02-12 MED ORDER — ASPIRIN 81 MG PO CHEW: 81.0000 mg | CHEWABLE_TABLET | ORAL | Status: DC

## 2016-02-12 MED ORDER — HEPARIN SODIUM (PORCINE) 1000 UNIT/ML IJ SOLN
INTRAMUSCULAR | Status: AC
  Filled 2016-02-12: qty 1

## 2016-02-12 MED ORDER — FUROSEMIDE 10 MG/ML IJ SOLN
20.0000 mg | Freq: Two times a day (BID) | INTRAMUSCULAR | Status: DC
  Administered 2016-02-12: 20 mg via INTRAVENOUS
  Filled 2016-02-12: qty 2

## 2016-02-12 MED ORDER — BISACODYL 5 MG PO TBEC
5.0000 mg | DELAYED_RELEASE_TABLET | Freq: Once | ORAL | Status: AC
  Administered 2016-02-12: 5 mg via ORAL
  Filled 2016-02-12: qty 1

## 2016-02-12 MED ORDER — TRANEXAMIC ACID (OHS) BOLUS VIA INFUSION
15.0000 mg/kg | INTRAVENOUS | Status: AC
  Administered 2016-02-13: 1671 mg via INTRAVENOUS
  Filled 2016-02-12: qty 1671

## 2016-02-12 MED ORDER — ALBUTEROL SULFATE (2.5 MG/3ML) 0.083% IN NEBU
2.5000 mg | INHALATION_SOLUTION | Freq: Once | RESPIRATORY_TRACT | Status: AC
  Administered 2016-02-12: 2.5 mg via RESPIRATORY_TRACT

## 2016-02-12 MED ORDER — NITROGLYCERIN 0.4 MG SL SUBL: 0.4000 mg | SUBLINGUAL_TABLET | SUBLINGUAL | Status: DC | PRN

## 2016-02-12 MED ORDER — INSULIN ASPART 100 UNIT/ML ~~LOC~~ SOLN: 0.0000 [IU] | Freq: Three times a day (TID) | SUBCUTANEOUS | Status: DC

## 2016-02-12 MED ORDER — LOSARTAN POTASSIUM 50 MG PO TABS
100.0000 mg | ORAL_TABLET | Freq: Every day | ORAL | Status: DC
  Administered 2016-02-12: 100 mg via ORAL
  Filled 2016-02-12: qty 2

## 2016-02-12 MED ORDER — CHLORHEXIDINE GLUCONATE 4 % EX LIQD
60.0000 mL | Freq: Once | CUTANEOUS | Status: AC
  Administered 2016-02-12: 4 via TOPICAL
  Filled 2016-02-12: qty 60

## 2016-02-12 MED ORDER — EPINEPHRINE PF 1 MG/ML IJ SOLN
0.0000 ug/min | INTRAVENOUS | Status: DC
  Filled 2016-02-12: qty 4

## 2016-02-12 MED ORDER — DOPAMINE-DEXTROSE 3.2-5 MG/ML-% IV SOLN
0.0000 ug/kg/min | INTRAVENOUS | Status: AC
  Administered 2016-02-13: 3 ug/kg/min via INTRAVENOUS
  Filled 2016-02-12 (×2): qty 250

## 2016-02-12 MED ORDER — SODIUM CHLORIDE 0.9 % WEIGHT BASED INFUSION
1.0000 mL/kg/h | INTRAVENOUS | Status: DC
  Administered 2016-02-12: 1 mL/kg/h via INTRAVENOUS

## 2016-02-12 MED ORDER — MAGNESIUM SULFATE 50 % IJ SOLN
40.0000 meq | INTRAMUSCULAR | Status: DC
  Filled 2016-02-12: qty 10

## 2016-02-12 MED ORDER — SODIUM CHLORIDE 0.9% FLUSH
3.0000 mL | Freq: Two times a day (BID) | INTRAVENOUS | Status: DC
  Administered 2016-02-12: 3 mL via INTRAVENOUS

## 2016-02-12 MED ORDER — POTASSIUM CHLORIDE 2 MEQ/ML IV SOLN
80.0000 meq | INTRAVENOUS | Status: DC
  Filled 2016-02-12: qty 40

## 2016-02-12 MED ORDER — SODIUM CHLORIDE 0.9 % IV SOLN: 20.0000 ug | Freq: Once | INTRAVENOUS | Status: DC

## 2016-02-12 MED ORDER — DIAZEPAM 5 MG PO TABS
5.0000 mg | ORAL_TABLET | Freq: Once | ORAL | Status: AC
  Administered 2016-02-13: 5 mg via ORAL
  Filled 2016-02-12: qty 1

## 2016-02-12 MED ORDER — DEXMEDETOMIDINE HCL IN NACL 400 MCG/100ML IV SOLN
0.1000 ug/kg/h | INTRAVENOUS | Status: AC
  Administered 2016-02-13: .5 ug/kg/h via INTRAVENOUS
  Filled 2016-02-12: qty 100

## 2016-02-12 MED ORDER — ALLOPURINOL 100 MG PO TABS
200.0000 mg | ORAL_TABLET | Freq: Every day | ORAL | Status: DC
  Administered 2016-02-12: 200 mg via ORAL
  Filled 2016-02-12: qty 2

## 2016-02-12 MED ORDER — CHLORHEXIDINE GLUCONATE 4 % EX LIQD
60.0000 mL | Freq: Once | CUTANEOUS | Status: AC
  Administered 2016-02-13: 4 via TOPICAL
  Filled 2016-02-12: qty 60

## 2016-02-12 MED ORDER — SODIUM CHLORIDE 0.9 % IV SOLN
INTRAVENOUS | Status: AC
  Administered 2016-02-13: 1.3 [IU]/h via INTRAVENOUS
  Filled 2016-02-12: qty 2.5

## 2016-02-12 MED ORDER — FENTANYL CITRATE (PF) 100 MCG/2ML IJ SOLN
INTRAMUSCULAR | Status: AC
  Filled 2016-02-12: qty 2

## 2016-02-12 MED ORDER — ATORVASTATIN CALCIUM 40 MG PO TABS
40.0000 mg | ORAL_TABLET | Freq: Every day | ORAL | Status: DC
  Administered 2016-02-12: 40 mg via ORAL
  Filled 2016-02-12: qty 1

## 2016-02-12 SURGICAL SUPPLY — 8 items
CATH INFINITI 5 FR JL3.5 (CATHETERS) ×3 IMPLANT
CATH INFINITI JR4 5F (CATHETERS) ×3 IMPLANT
CATH OPTITORQUE JACKY 4.0 5F (CATHETERS) ×3 IMPLANT
DEVICE RAD TR BAND REGULAR (VASCULAR PRODUCTS) ×3 IMPLANT
GLIDESHEATH SLEND SS 6F .021 (SHEATH) ×3 IMPLANT
KIT MANI 3VAL PERCEP (MISCELLANEOUS) ×3 IMPLANT
PACK CARDIAC CATH (CUSTOM PROCEDURE TRAY) ×3 IMPLANT
WIRE ROSEN-J .035X260CM (WIRE) ×3 IMPLANT

## 2016-02-12 NOTE — Interval H&P Note (Signed)
Cath Lab Visit (complete for each Cath Lab visit)  Clinical Evaluation Leading to the Procedure:   ACS: Yes.    Non-ACS:    Anginal Classification: CCS IV  Anti-ischemic medical therapy: Minimal Therapy (1 class of medications)  Non-Invasive Test Results: No non-invasive testing performed  Prior CABG: No previous CABG      History and Physical Interval Note:  02/12/2016 7:45 AM  Nelson ChimesJames Dohmen  has presented today for surgery, with the diagnosis of NSTEMI  The various methods of treatment have been discussed with the patient and family. After consideration of risks, benefits and other options for treatment, the patient has consented to  Procedure(s): Left Heart Cath and Coronary Angiography (N/A) as a surgical intervention .  The patient's history has been reviewed, patient examined, no change in status, stable for surgery.  I have reviewed the patient's chart and labs.  Questions were answered to the patient's satisfaction.     Lorine BearsMuhammad Shaneil Yazdi

## 2016-02-12 NOTE — H&P (Signed)
History and Physical  Patient ID: James Clayton MRN: [id number], DOB: [birth year] Admit Date: (Not on file) Date of Encounter: 02/12/2016, 9:30 AM Primary Physician: Patriciaann ClanBUTLER,[name], DO [facility] (consult by Dr. Kirke CorinArida, MD)  Chief Complaint: Chest pain Reason for Admission: NSTEMI  HPI: 70 y.o. male with h/o newly diagnosed severe multi-vessel CAD, chornic diastolic CHF, hypertensive heart disease, diabetes mellitus with complications, CKD stage III, and HLD who [facility] with chest pain, found to have a NSTEMI with cardiac cath on 02/12/16 showing severe multi-vessel CAD.  Prior to this admission he did not have any previously diagnosed cardiac history. He did report a remote stress test in the past without history of coronary revascularization or angiography. [facility] with chest pain that began around 2 AM, characterized as severe initially followed by substernal tightness and heaviness with associated SOB. He took 2 ASA, the pain continued, thus he called EMS. [facility] he was noted to be very hypertensive with systolic BP over 409200 mmHg. He was given 3 SL NTG with gradual improvement in his chest pain. EKG did not show significant ST deviation. Troponin peaked at 6.16. He was started on a heparin gtt and required IV hydration in an effort to avoid contrast-induced nephropathy. Echo on 11/25 showed EF of 50-55%, mild inferior hypokinesis, GR2DD, PASP elevated at 42 mmHg. His BP was improved to SBP in the 150's over the weekend. He remained chest pain free. He underwent LHC on 11/27 that showed severe multi-vessel CAD with 90% stenosis of the ostial left main with a severely calcified vessel, ostial LAD to mid LAD stenosis of 90%, ostial ramus 80% stenosed, ostial to proximal LCx 90% stenosed, ostial to proximal RCA 100% stenosed and severely calcified. RDPA was filled by collaterals from the distal LAD. Severely elevated  LVEDP. It was [facility] cardiac [facility].   Past Medical History:  Diagnosis Date  . CAD (coronary artery disease)    a. LHC 02/12/16: ostLM-LM 90% severely calcified, ost-mLAD 90%, ostRamus-Ramus 80%, ostLCx-pLCx 90%, ostRCA-pRCA 100% severely calcified, LVEDP severely elevated; rec CABG  . Chronic diastolic CHF (congestive heart failure) (HCC)    a. echo 02/10/16: EF 50-55%, mild inf HK, GR2DD, mild to mod MR, PASP 46 mmHg  . CKD (chronic kidney disease), stage III   . Diabetes mellitus with complication (HCC)   . HLD (hyperlipidemia)   . Hypertension      Most Recent Cardiac Studies: LHC 02/12/2016: Coronary Findings   Dominance: Right  Left Main  Ost LM to LM lesion, 90% stenosed. The lesion is severely calcified.  Left Anterior Descending  Ost LAD to Mid LAD lesion, 90% stenosed.  First Diagonal Branch  Vessel is small in size. There is moderate disease in the vessel.  Second Diagonal Branch  Vessel is small in size. The vessel exhibits minimal luminal irregularities.  Third Diagonal Branch  Vessel is small in size. The vessel exhibits minimal luminal irregularities.  Ramus Intermedius  Ost Ramus to Ramus lesion, 80% stenosed.  Left Circumflex  Vessel is small.  Ost Cx to Prox Cx lesion, 90% stenosed.  Right Coronary Artery  Ost RCA to Prox RCA lesion, 100% stenosed. The lesion is severely calcified.  Right Posterior Descending Artery  RPDA filled by collaterals from Dist LAD.  Left Heart   Left Ventricle LV end diastolic pressure is severely elevated.    Aortic Valve  There is no aortic valve stenosis.    Coronary Diagrams   Diagnostic Diagram      Echo 02/10/2016: Study Conclusions  - Left ventricle: The cavity size was normal. Wall thickness was   normal. Systolic function was normal. The estimated ejection   fraction was in the range of 50% to 55%. There is mild   hypokinesis of the inferior segments. Features  are consistent   with a pseudonormal left ventricular filling pattern, with   concomitant abnormal relaxation and increased filling pressure   (grade 2 diastolic dysfunction). - Mitral valve: There was mild to moderate regurgitation. - Pulmonary arteries: Systolic pressure was mildly to moderately   increased. PA peak pressure: 46 mm Hg (S).   Surgical History:  Past Surgical History:  Procedure Laterality Date  . JOINT REPLACEMENT       Home Meds: Prior to Admission medications   Medication Sig Start Date End Date Taking? Authorizing Provider  allopurinol (ZYLOPRIM) 100 MG tablet Take 2 tablets by mouth daily. 01/22/16   Historical Provider, MD  aspirin EC 81 MG tablet Take 81 mg by mouth daily.    Historical Provider, MD  atorvastatin (LIPITOR) 20 MG tablet Take 1 tablet by mouth daily. 02/02/16   Historical Provider, MD  carvedilol (COREG) 25 MG tablet Take 1 tablet by mouth 2 (two) times daily. 01/22/16   Historical Provider, MD  furosemide (LASIX) 40 MG tablet Take 40 mg by mouth every morning. 02/02/16   Historical Provider, MD  glipiZIDE (GLUCOTROL XL) 2.5 MG 24 hr tablet Take 2.5 mg by mouth daily. 01/22/16   Historical Provider, MD  losartan (COZAAR) 100 MG tablet Take 100 mg by mouth daily. 02/02/16   Historical Provider, MD  sildenafil (REVATIO) 20 MG tablet Take 1-2 tablets by mouth as needed. 01/04/16   Historical Provider, MD  terazosin (HYTRIN) 5 MG capsule Take 1 capsule by mouth at bedtime. 01/22/16   Historical Provider, MD    Allergies: No Known Allergies  Social History   Social History  . Marital status: Married    Spouse name: N/A  . Number of children: N/A  . Years of education: N/A   Occupational History  . retired    Social History Main Topics  . Smoking status: Former Games developermoker  . Smokeless tobacco: Never Used  . Alcohol use No  . Drug use: No  . Sexual activity: Not on file   Other Topics Concern  . Not on file   Social History Narrative  . No  narrative on file     Family History  Problem Relation Age of Onset  . Cancer Mother   . Heart disease Father     Review of Systems: Review of Systems  Constitutional: Positive for malaise/fatigue. Negative for chills, diaphoresis, fever and weight loss.  HENT: Negative for congestion.   Eyes: Negative for discharge and redness.  Respiratory: Positive for shortness of breath. Negative for cough, hemoptysis, sputum production and wheezing.   Cardiovascular: Positive for chest pain. Negative for palpitations, orthopnea, claudication, leg swelling and PND.  Gastrointestinal: Negative for abdominal pain, blood in stool, heartburn, melena, nausea and vomiting.  Genitourinary: Negative for hematuria.  Musculoskeletal: Negative for falls and myalgias.  Skin: Negative for rash.  Neurological: Negative for dizziness, tingling, tremors, sensory change, speech change, focal weakness, loss of consciousness and weakness.  Endo/Heme/Allergies: Does not bruise/bleed easily.  Psychiatric/Behavioral: Negative for substance abuse. The patient is not nervous/anxious.   All other systems reviewed and are negative.  Labs:   Lab Results  Component Value Date   WBC 10.2 02/12/2016   HGB 13.0 02/12/2016   HCT 37.9 (L) 02/12/2016   MCV 89.0 02/12/2016   PLT 149 (L) 02/12/2016     Recent Labs Lab 02/12/16 0429  NA 140  K 4.3  CL 113*  CO2 21*  BUN 30*  CREATININE 1.69*  CALCIUM 8.6*  GLUCOSE 123*    Recent Labs  02/09/16 1240 02/09/16 1921 02/10/16 0148 02/11/16 0424  TROPONINI 6.16* 4.29* 2.46* 1.33*   Lab Results  Component Value Date   CHOL 120 02/09/2016   HDL 23 (L) 02/09/2016   LDLCALC 42 02/09/2016   TRIG 273 (H) 02/09/2016   No results found for: DDIMER  Radiology/Studies:  Dg Chest 2 View  Result Date: 02/09/2016 CLINICAL DATA:  Left-sided chest pain radiating to both arms beginning at 0045 hours. Hypertension. EXAM: CHEST  2 VIEW COMPARISON:  None. FINDINGS:  Shallow inspiration. Normal heart size and pulmonary vascularity. Diffuse interstitial pattern to the lungs with central predominance. This may represent bronchitic change, fibrosis, or edema. No focal consolidation or airspace disease. No blunting of costophrenic angles. No pneumothorax. Calcification of the aorta. IMPRESSION: Diffuse mostly central interstitial changes to the lungs could indicate bronchitic changes, fibrosis, or edema. No focal consolidation. Normal heart size. Electronically Signed   By: Burman Nieves M.D.   On: 02/09/2016 03:48     EKG: Interpreted by me showed: NSR, 84 bpm, nonspecific st/t changes Telemetry: Interpreted by me showed: NSR, 70's  Weights: Filed Weights   02/09/16 0310 02/09/16 0600  Weight: 250 lb (113.4 kg) 246 lb 9.6 oz (111.9 kg)   Body mass index is 32.53 kg/m.   Physical Exam: Vitals: BP 156/81, P 79, T 97.8, R 16, O2 95%  General: Well developed, well nourished, in no acute distress. Head: Normocephalic, atraumatic, sclera non-icteric, no xanthomas, nares are without discharge.  Neck: Negative for carotid bruits. JVD not elevated. Lungs: Clear bilaterally to auscultation without wheezes, rales, or rhonchi. Breathing is unlabored. Heart: RRR with S1 S2. No murmurs, rubs, or gallops appreciated. Abdomen: Soft, non-tender, non-distended with normoactive bowel sounds. No hepatomegaly. No rebound/guarding. No obvious abdominal masses. Msk:  Strength and tone appear normal for age. Extremities: No clubbing or cyanosis. No edema. Distal pedal pulses are 2+ and equal bilaterally. Neuro: Alert and oriented X 3. No focal deficit. No facial asymmetry. Moves all extremities spontaneously. Psych:  Responds to questions appropriately with a normal affect.    ASSESSMENT AND PLAN:  Principal Problem:   Non-STEMI (non-ST elevated myocardial infarction) (HCC) Active Problems:   Diabetes mellitus with complication (HCC)   CAD in native artery    Hypertension   Chronic diastolic heart failure (HCC)   Hyperlipidemia   1. NSTEMI/severe multi-vessel CAD: -Status post cardiac cath 02/12/16 as above -Recommendation to transfer to [facility] for evaluation of possible cardiac bypass -Currently chest pain free -Restart heparin gtt 8 hours s/p sheath pull -He did receive Plavix 300 mg x 1 dose on 11/24, none since -Continue Coreg 25 mg bid, ASA 81 mg, and Lipitor 40 mg -Start Imdur 30 mg daily -Cardiac rehab  2. Hypertensive heart disease: -Imdur added as above -Continue Coreg and losartan   3. CKD stage III: -Status post IV hydration over the weekend -May require gentle diuresis -Monitor renal function closely  4. Chronic diastolic CHF: -As above  5. DM: -Not on metformin as an outpatient -SSI  6. HLD: -Lipitor  7. Gout: -Stable -Continue allopurinol   8. ED: -Sildenafil    Signed, Eula Listen, PA-C [facility] Pager: [phone] 02/12/2016, 9:30 AM

## 2016-02-12 NOTE — Progress Notes (Signed)
ANTICOAGULATION CONSULT NOTE - Initial Consult  Pharmacy Consult for heparin Indication: chest pain/ACS  No Known Allergies  Patient Measurements: Height: 6\' 1"  (185.4 cm) Weight: 246 lb 9.6 oz (111.9 kg) IBW/kg (Calculated) : 79.9 Heparin Dosing Weight: 103.9 kg  Vital Signs: Temp: 97.8 F (36.6 C) (11/27 0553) Temp Source: Oral (11/27 0553) BP: 156/86 (11/27 0553) Pulse Rate: 73 (11/27 0553)  Labs:  Recent Labs  02/09/16 0603  02/09/16 1921 02/10/16 0148 02/11/16 0424 02/11/16 1346 02/11/16 2051 02/12/16 0429  HGB 13.4  --   --  12.5* 13.4  --   --  13.0  HCT 40.0  --   --  38.5* 39.1*  --   --  37.9*  PLT 172  --   --  150 149*  --   --  149*  HEPARINUNFRC  --   < > 0.33 0.34 0.29* 0.36 0.34 0.30  CREATININE 1.95*  --   --   --  1.51*  --   --  1.69*  TROPONINI 2.81*  < > 4.29* 2.46* 1.33*  --   --   --   < > = values in this interval not displayed.  Estimated Creatinine Clearance: 53.3 mL/min (by C-G formula based on SCr of 1.69 mg/dL (H)).   Medical History: Past Medical History:  Diagnosis Date  . Diabetes mellitus without complication (HCC)   . Hypertension     Medications:  Scheduled:  . allopurinol  200 mg Oral Daily  . [START ON 02/13/2016] aspirin  81 mg Oral Pre-Cath  . aspirin EC  81 mg Oral Daily  . atorvastatin  40 mg Oral q1800  . carvedilol  25 mg Oral BID WC  . glipiZIDE  2.5 mg Oral Q breakfast  . insulin aspart  0-9 Units Subcutaneous TID WC  . losartan  100 mg Oral Daily  . sodium chloride flush  3 mL Intravenous Q12H  . terazosin  5 mg Oral QHS    Assessment: 70 yom with left sided CP per EMS. Pharmacy consulted to dose heparin for ACS. No AC PTA per RN/med list.  Goal of Therapy:  Heparin level 0.3-0.7 units/ml Monitor platelets by anticoagulation protocol: Yes   Plan: 1126 @ 1346 HL = 0.36 (therapeutic). Will continue heparin drip at current rate of 1500 units/hr and order confirmatory HL in 6 hours.  11/26 20:51 HL =  0.34. Will continue with current rate of 1500 units/hr. Next HL with AM labs.  11/27 AM heparin level 0.30. Continue current regimen. Recheck heparin level and CBC with tomorrow AM labs. Per cardiology note, patient to go for cath today.  CBC ordered with AM labs tomorrow.  Clovia CuffLisa Kluttz, PharmD, BCPS 02/12/2016 5:57 AM

## 2016-02-12 NOTE — H&P (View-Only) (Signed)
Patient Name: James ChimesJames Fan Date of Encounter: 02/11/2016  Primary Cardiologist: Dr. Emi HolesArida  Hospital Problem List     Principal Problem:   Non-STEMI (non-ST elevated myocardial infarction) [facility](HCC) Active Problems:   Diabetes mellitus without complication (HCC)   Hypertension   Hyperlipidemia     Subjective   Patient is doing well. No recurrence of chest pain. No SOB, palpitations, PND, orthopnea.   Inpatient Medications    Scheduled Meds: . allopurinol  200 mg Oral Daily  . aspirin EC  81 mg Oral Daily  . atorvastatin  40 mg Oral q1800  . carvedilol  25 mg Oral BID WC  . glipiZIDE  2.5 mg Oral Q breakfast  . insulin aspart  0-9 Units Subcutaneous TID WC  . losartan  100 mg Oral Daily  . terazosin  5 mg Oral QHS   Continuous Infusions: . sodium chloride    . heparin 1,500 Units/hr (02/11/16 0942)   PRN Meds: acetaminophen, morphine injection, nitroGLYCERIN, ondansetron (ZOFRAN) IV, oxyCODONE-acetaminophen   Vital Signs    Vitals:   02/10/16 1751 02/10/16 1924 02/11/16 0440 02/11/16 0840  BP: (!) 166/76 (!) 167/83 (!) 170/82 (!) 156/81  Pulse: 72 78 82 79  Resp:  17 15   Temp:  98.5 F (36.9 C) 97.7 F (36.5 C) 97.8 F (36.6 C)  TempSrc:  Oral Oral Oral  SpO2:  97% 97% 95%  Weight:      Height:        Intake/Output Summary (Last 24 hours) at 02/11/16 1056 Last data filed at 02/11/16 1040  Gross per 24 hour  Intake              480 ml  Output             1200 ml  Net             -720 ml   Filed Weights   02/09/16 0310 02/09/16 0600  Weight: 250 lb (113.4 kg) 246 lb 9.6 oz (111.9 kg)   Body mass index is 32.53 kg/m.   Physical Exam  GENERAL:  well developed, well nourished, obese, not in acute distress HEENT: normocephalic, pink conjunctivae, anicteric sclerae, no xanthelasma, normal dentition, oropharynx clear NECK:  no neck vein engorgement, JVP normal, no hepatojugular reflux, carotid upstroke brisk and symmetric, no bruit, no thyromegaly,  no lymphadenopathy LUNGS:  good respiratory effort, clear to auscultation bilaterally CV:  PMI not displaced, no thrills, no lifts, S1 and S2 within normal limits, no palpable S3 or S4, no murmurs, no rubs, no gallops ABD:  Soft, nontender, nondistended, normoactive bowel sounds, no abdominal aortic bruit, no hepatomegaly, no splenomegaly MS: nontender back, no kyphosis, no scoliosis, no joint deformities EXT:  2+ DP/PT pulses, trace edema, no varicosities, no cyanosis, no clubbing SKIN: warm, nondiaphoretic, normal turgor, no ulcers NEUROPSYCH: alert, oriented to person, place, and time, sensory/motor grossly intact, normal mood, appropriate affect    Labs    CBC  Recent Labs  02/10/16 0148 02/11/16 0424  WBC 12.4* 11.1*  HGB 12.5* 13.4  HCT 38.5* 39.1*  MCV 91.0 89.1  PLT 150 149*   Basic Metabolic Panel  Recent Labs  02/09/16 0603 02/11/16 0424  NA 142 140  K 4.0 3.9  CL 112* 112*  CO2 23 23  GLUCOSE 193* 146*  BUN 41* 31*  CREATININE 1.95* 1.51*  CALCIUM 9.5 9.0   Liver Function Tests No results for input(s): AST, ALT, ALKPHOS, BILITOT, PROT, ALBUMIN in the last 72  hours. No results for input(s): LIPASE, AMYLASE in the last 72 hours. Cardiac Enzymes  Recent Labs  02/09/16 1921 02/10/16 0148 02/11/16 0424  TROPONINI 4.29* 2.46* 1.33*   BNP Invalid input(s): POCBNP D-Dimer No results for input(s): DDIMER in the last 72 hours. Hemoglobin A1C No results for input(s): HGBA1C in the last 72 hours. Fasting Lipid Panel  Recent Labs  02/09/16 0603  CHOL 120  HDL 23*  LDLCALC 42  TRIG 161273*  CHOLHDL 5.2   Thyroid Function Tests No results for input(s): TSH, T4TOTAL, T3FREE, THYROIDAB in the last 72 hours.  Invalid input(s): FREET3  Telemetry    SR - Personally Reviewed  ECG    02/09/2016: SR 84 - Personally Reviewed  Radiology    No results found.  Cardiac Studies   02/11/2016: Left ventricle: The cavity size was normal. Wall thickness  was   normal. Systolic function was normal. The estimated ejection   fraction was in the range of 50% to 55%. There is mild   hypokinesis of the inferior segments. Features are consistent   with a pseudonormal left ventricular filling pattern, with   concomitant abnormal relaxation and increased filling pressure   (grade 2 diastolic dysfunction). - Mitral valve: There was mild to moderate regurgitation. - Pulmonary arteries: Systolic pressure was mildly to moderately   increased. PA peak pressure: 46 mm Hg (S).   Patient Profile     James Clayton is a 70 y.o. male with history of diabetes mellitus, hypertension, hyperlipidemia and chronic kidney disease. He presented with CP and diagnosed with NSTEMI  Assessment & Plan    Non-ST elevation myocardial infarction No CP currently.  Continue with unfractionated heparin, daily aspirin and atorvastatin.  Will add Imdur ER 30 mg po qd for blood pressure and vasodilation.  LV EF on echo low normal, mild hypokinesis of the inferior segments. Cardiac catheterization and possible coronary intervention on 02/12/2016 c/o Dr. Kirke CorinArida. The patient will require at least 12 hours of hydration before the procedure in order to minimize the risk of contrast-induced nephropathy -- orders palced by Dr. Kirke CorinArida 11/24.  Essential hypertension Will add Imdur as above.Continue treatment with carvedilol and losartan.  Hyperlipidemia On atorvastatin 40 mg QHS.Marland Kitchen. LDL < 70 goal. Lifestyle changes recommended.   May benefit from sleep study as outpatient -- heavy snoring, HTN on mult meds, obesity.   I spent at least 35 minutes with the patient today and more than 50% of the time was spent counseling the patient and coordinating care.     Signed, Almond LintAileen Twinkle Sockwell, MD, [facility] 02/11/2016, 10:56 AM

## 2016-02-12 NOTE — Progress Notes (Signed)
ANTICOAGULATION CONSULT NOTE - Follow Up Consult  Pharmacy Consult for heparin Indication: severe multivessel CAD  No Known Allergies  Patient Measurements: Height: 6\' 1"  (185.4 cm) Weight: 245 lb 11.2 oz (111.4 kg) IBW/kg (Calculated) : 79.9 Heparin Dosing Weight: 104 kg  Vital Signs: Temp: 97.8 F (36.6 C) (11/27 0553) Temp Source: Oral (11/27 0553) BP: 180/80 (11/27 1041) Pulse Rate: 65 (11/27 1041)  Labs:  Recent Labs  02/09/16 1921  02/10/16 0148 02/11/16 0424 02/11/16 1346 02/11/16 2051 02/12/16 0429  HGB  --   < > 12.5* 13.4  --   --  13.0  HCT  --   --  38.5* 39.1*  --   --  37.9*  PLT  --   --  150 149*  --   --  149*  HEPARINUNFRC 0.33  --  0.34 0.29* 0.36 0.34 0.30  CREATININE  --   --   --  1.51*  --   --  1.69*  TROPONINI 4.29*  --  2.46* 1.33*  --   --   --   < > = values in this interval not displayed.  Estimated Creatinine Clearance: 53.2 mL/min (by C-G formula based on SCr of 1.69 mg/dL (H)).   Medications:  Infusions:    Assessment: 70 y/o male [facility] with [facility] pain and found to have NSTEMI. He was started on IV heparin. Cardiac cath this morning showed severe multivessel CAD. [facility]. Pharmacy consulted to resume IV heparin 8 hrs post sheath removal; sheath removed at 08:19. Heparin level was therapeutic on low end of [facility] on 1500 units/hr so will increase rate a little. No bleeding reported, Hgb is stable, platelets down to 149 today.  Goal of Therapy:  Heparin level 0.3-0.7 units/ml Monitor platelets by anticoagulation protocol: Yes   Plan:  - Resume IV heparin drip with no bolus at 1600 units/hr at 16:20 - 8 hr heparin level - Daily heparin level and CBC - Monitor for s/sx of bleeding   Loura BackJennifer Lewiston, PharmD, BCPS Clinical Pharmacist Phone for today [phone] Main pharmacy - [phone] 02/12/2016 12:04 PM

## 2016-02-12 NOTE — Consult Note (Signed)
[address]       [place] [address]             [phone]        Raul [facility] Medical Record #[id number] Date of Birth: 1945/09/24  Referring: Sloan Leiter M.D.  Primary Care: Patriciaann Clan, DO  Chief Complaint:   No chief complaint on file.  chest pain   History of Present Illness:      patient examined, 2-D echocardiogram and coronary angiograms personally reviewed and counseled with patient    70 year old obese Caucasian male diabetic reformed smoker admitted to [facility] 5 days ago for chest pain, non-STEMI. Echocardiogram shows normal LV systolic function with mild mitral regurgitation. Patient was transferred to  today after cardiac catheterization showed significant left main and three-vessel CAD and surgical coronary revascularization was recommended for this patient by his cardiologist Dr. Kirke Corin  The patient was cathed via right radial artery. He has a 90% left main stenosis. He has total RCA occlusion, chronic He has high-grade 90% stenosis of his circumflex with small distal vessel He has 80% proximal stenosis of the ramus intermediate LVEDP was elevated at 35 mmHg  The patient has chronic renal insufficiency creatinine on presentation was approximately 2.0. CKD stage II Hemoglobin A1c is 7.1. Chest x-ray shows mild interstitial edema. Pre-CABG Dopplers show no significant carotid disease. PFTs show adequate mechanics for sternotomy.  Current Activity/ Functional Status: Patient is retired from [employer] and enjoys playing golf   Zubrod Score: At the time of surgery this patient's most appropriate activity status/level should be described as: []     0    Normal activity, no symptoms []     1    Restricted in physical strenuous activity but ambulatory, able to do out light work [x]     2    Ambulatory and capable of self care, unable to do work activities, up and about                 more  than 50%  Of the time                            []     3    Only limited self care, in bed greater than 50% of waking hours []     4    Completely disabled, no self care, confined to bed or chair []     5    Moribund  Past Medical History:  Diagnosis Date  . CAD (coronary artery disease)    a. LHC 02/12/16: ostLM-LM 90% severely calcified, ost-mLAD 90%, ostRamus-Ramus 80%, ostLCx-pLCx 90%, ostRCA-pRCA 100% severely calcified, LVEDP severely elevated; rec CABG  . Chronic diastolic CHF (congestive heart failure) (HCC)    a. echo 02/10/16: EF 50-55%, mild inf HK, GR2DD, mild to mod MR, PASP 46 mmHg  . CKD (chronic kidney disease), stage III   . Diabetes mellitus with complication (HCC)   . HLD (hyperlipidemia)   . Hypertension     Past Surgical History:  Procedure Laterality Date  . JOINT REPLACEMENT      History  Smoking Status  . Former Smoker  Smokeless Tobacco  . Never Used   Quit smoking 30 years ago  History  Alcohol Use No  None   Social History   Social History  . Marital status: Married    Spouse name: N/A  . Number of children: N/A  .  Years of education: N/A   Occupational History  . retired    Social History Main Topics  . Smoking status: Former Games developermoker  . Smokeless tobacco: Never Used  . Alcohol use No  . Drug use: No  . Sexual activity: Not on file   Other Topics Concern  . Not on file   Social History Narrative  . No narrative on file    No Known Allergies  Current Facility-Administered Medications  Medication Dose Route Frequency Provider Last Rate Last Dose  . acetaminophen (TYLENOL) tablet 650 mg  650 mg Oral Q4H PRN Rhonda G Barrett, PA-C      . allopurinol (ZYLOPRIM) tablet 200 mg  200 mg Oral Daily Rhonda G Barrett, PA-C   200 mg at 02/12/16 1227  . ALPRAZolam Prudy Feeler(XANAX) tablet 0.25-0.5 mg  0.25-0.5 mg Oral Q4H PRN Kerin PernaPeter Van Trigt, MD      . Melene Muller[START ON 02/13/2016] aspirin EC tablet 81 mg  81 mg Oral Daily Rhonda G Barrett, PA-C      .  atorvastatin (LIPITOR) tablet 40 mg  40 mg Oral q1800 Rhonda G Barrett, PA-C   40 mg at 02/12/16 1705  . carvedilol (COREG) tablet 25 mg  25 mg Oral BID WC Rhonda G Barrett, PA-C   25 mg at 02/12/16 1705  . [START ON 02/13/2016] cefUROXime (ZINACEF) 1.5 g in dextrose 5 % 50 mL IVPB  1.5 g Intravenous To OR Kerin PernaPeter Van Trigt, MD      . Melene Muller[START ON 02/13/2016] cefUROXime (ZINACEF) 750 mg in dextrose 5 % 50 mL IVPB  750 mg Intravenous To OR Kerin PernaPeter Van Trigt, MD      . chlorhexidine (HIBICLENS) 4 % liquid 4 application  60 mL Topical Once Kerin PernaPeter Van Trigt, MD       And  . Melene Muller[START ON 02/13/2016] chlorhexidine (HIBICLENS) 4 % liquid 4 application  60 mL Topical Once Kerin PernaPeter Van Trigt, MD      . Melene Muller[START ON 02/13/2016] chlorhexidine (PERIDEX) 0.12 % solution 15 mL  15 mL Mouth/Throat Once Kerin PernaPeter Van Trigt, MD      . Melene Muller[START ON 02/13/2016] dexmedetomidine (PRECEDEX) 400 MCG/100ML (4 mcg/mL) infusion  0.1-0.7 mcg/kg/hr Intravenous To OR Kerin PernaPeter Van Trigt, MD      . Melene Muller[START ON 02/13/2016] diazepam (VALIUM) tablet 5 mg  5 mg Oral Once Kerin PernaPeter Van Trigt, MD      . Melene Muller[START ON 02/13/2016] DOPamine (INTROPIN) 800 mg in dextrose 5 % 250 mL (3.2 mg/mL) infusion  0-10 mcg/kg/min Intravenous To OR Kerin PernaPeter Van Trigt, MD      . Melene Muller[START ON 02/13/2016] EPINEPHrine (ADRENALIN) 4 mg in dextrose 5 % 250 mL (0.016 mg/mL) infusion  0-10 mcg/min Intravenous To OR Kerin PernaPeter Van Trigt, MD      . furosemide (LASIX) injection 20 mg  20 mg Intravenous BID Kerin PernaPeter Van Trigt, MD   20 mg at 02/12/16 1706  . [START ON 02/13/2016] glipiZIDE (GLUCOTROL XL) 24 hr tablet 2.5 mg  2.5 mg Oral Q breakfast Rhonda G Barrett, PA-C      . [START ON 02/13/2016] heparin 2,500 Units, papaverine 30 mg in electrolyte-148 (PLASMALYTE-148) 500 mL irrigation   Irrigation To OR Kerin PernaPeter Van Trigt, MD      . Melene Muller[START ON 02/13/2016] heparin 30,000 units/NS 1000 mL solution for CELLSAVER   Other To OR Kerin PernaPeter Van Trigt, MD      . heparin ADULT infusion 100 units/mL (25000 units/26850mL sodium  chloride 0.45%)  1,600 Units/hr Intravenous Continuous Lynita LombardJennifer D [place] [facility], [facility]  16 mL/hr at 02/12/16 1707 1,600 Units/hr at 02/12/16 1707  . insulin aspart (novoLOG) injection 0-9 Units  0-9 Units Subcutaneous TID WC Rhonda G Barrett, PA-C      . [START ON 02/13/2016] insulin regular (NOVOLIN R,HUMULIN R) 250 Units in sodium chloride 0.9 % 250 mL (1 Units/mL) infusion   Intravenous To OR Kerin PernaPeter Van Trigt, MD      . isosorbide mononitrate (IMDUR) 24 hr tablet 30 mg  30 mg Oral Daily Rhonda G Barrett, PA-C   30 mg at 02/12/16 1227  . losartan (COZAAR) tablet 100 mg  100 mg Oral Daily Joline SaltRhonda G Barrett, PA-C   100 mg at 02/12/16 1227  . [START ON 02/13/2016] magnesium sulfate (IV Push/IM) injection 40 mEq  40 mEq Other To OR Kerin PernaPeter Van Trigt, MD      . Melene Muller[START ON 02/13/2016] metoprolol tartrate (LOPRESSOR) tablet 12.5 mg  12.5 mg Oral Once Kerin PernaPeter Van Trigt, MD      . nitroGLYCERIN (NITROSTAT) SL tablet 0.4 mg  0.4 mg Sublingual Q5 Min x 3 PRN Darrol Jumphonda G Barrett, PA-C      . [START ON 02/13/2016] nitroGLYCERIN 50 mg in dextrose 5 % 250 mL (0.2 mg/mL) infusion  2-200 mcg/min Intravenous To OR Kerin PernaPeter Van Trigt, MD      . ondansetron [facility](ZOFRAN) injection 4 mg  4 mg Intravenous Q6H PRN Rhonda G Barrett, PA-C      . oxyCODONE-acetaminophen (PERCOCET/ROXICET) 5-325 MG per tablet 1 tablet  1 tablet Oral Q6H PRN Darrol Jumphonda G Barrett, PA-C      . [START ON 02/13/2016] phenylephrine (NEO-SYNEPHRINE) 20 mg in dextrose 5 % 250 mL (0.08 mg/mL) infusion  30-200 mcg/min Intravenous To OR Kerin PernaPeter Van Trigt, MD      . Melene Muller[START ON 02/13/2016] potassium chloride injection 80 mEq  80 mEq Other To OR Kerin PernaPeter Van Trigt, MD      . temazepam (RESTORIL) capsule 15 mg  15 mg Oral Once PRN Kerin PernaPeter Van Trigt, MD      . terazosin (HYTRIN) capsule 5 mg  5 mg Oral QHS Rhonda G Barrett, PA-C      . [START ON 02/13/2016] tranexamic acid (CYKLOKAPRON) 2,500 mg in sodium chloride 0.9 % 250 mL (10 mg/mL) infusion  1.5 mg/kg/hr Intravenous To OR Kerin PernaPeter Van Trigt, MD       . Melene Muller[START ON 02/13/2016] tranexamic acid (CYKLOKAPRON) bolus via infusion - over 30 minutes 1,671 mg  15 mg/kg Intravenous To OR Kerin PernaPeter Van Trigt, MD      . Melene Muller[START ON 02/13/2016] tranexamic acid (CYKLOKAPRON) pump prime solution 223 mg  2 mg/kg Intracatheter To OR Kerin PernaPeter Van Trigt, MD      . Melene Muller[START ON 02/13/2016] vancomycin (VANCOCIN) 1,250 mg in sodium chloride 0.9 % 250 mL IVPB  1,250 mg Intravenous To OR Kerin PernaPeter Van Trigt, MD        Prescriptions Prior to Admission  Medication Sig Dispense Refill Last Dose  . allopurinol (ZYLOPRIM) 100 MG tablet Take 100 mg by mouth 2 (two) times daily.   12 Past Week at Unknown time  . aspirin EC 81 MG tablet Take 81 mg by mouth daily.   Past Week at Unknown time  . atorvastatin (LIPITOR) 20 MG tablet Take 20 mg by mouth daily.   6 Past Week at Unknown time  . carvedilol (COREG) 25 MG tablet Take 25 mg by mouth daily.   12 Past Week at Unknown time  . furosemide (LASIX) 40 MG tablet Take 40 mg by mouth  every morning.  12 02/12/2016 at Unknown time  . glipiZIDE (GLUCOTROL XL) 2.5 MG 24 hr tablet Take 2.5 mg by mouth daily.  11 Past Week at Unknown time  . losartan (COZAAR) 100 MG tablet Take 100 mg by mouth daily.  12 Past Week at Unknown time  . terazosin (HYTRIN) 5 MG capsule Take 5 mg by mouth at bedtime.   5 Past Week at Unknown time  . sildenafil (REVATIO) 20 MG tablet Take 1-2 tablets by mouth as needed.  1 Unknown at Unknown    Family History  Problem Relation Age of Onset  . Cancer Mother   . Heart disease Father      Review of Systems:       Cardiac Review of Systems: Y or N  Chest Pain [    yes no  ]  Resting SOB [   ] Exertional SOB  [ yes   ]  Orthopnea [ no   ]   Pedal Edema [   ]    Palpitations [  no  ] Syncope  [  no  ]   Presyncope [  no   ]  General Review of Systems: [Y] = yes [  ]=no Constitional: recent weight change [  ]; anorexia [  ]; fatigue [  ]; nausea [  ]; night sweats [  ]; fever [  ]; or chills [  ]                                                                Dental: poor dentition[  ]; Last Dentist visit:  total plates  Eye : blurred vision [  ]; diplopia [   ]; vision changes [  ];  Amaurosis fugax[  ]; Resp: cough [  ];  wheezing[  ];  hemoptysis[  ]; shortness of breath[  ]; paroxysmal nocturnal dyspnea[  ]; dyspnea on exertion[  yes  ]; or orthopnea[  ];  GI:  gallstones[  ], vomiting[  ];  dysphagia[  ]; melena[  ];  hematochezia [  ]; heartburn[  ];   Hx of  Colonoscopy[  ]; GU: kidney stones [  ]; hematuria[  ];   dysuria [  ];  nocturia[  ];  history of     obstruction [  ]; urinary frequency [   yes ]             Skin: rash, swelling[  ];, hair loss[  ];  peripheral edema[  ];  or itching[  ]; Musculosketetal: myalgias[  ];  joint swelling[  ];  joint erythema[  ];  joint pain[  yes gout  ];  back pain[  ];  Heme/Lymph: bruising[  ];  bleeding[  ];  anemia[  ];  Neuro: TIA[  ];  headaches[  ];  stroke[  ];  vertigo[  ];  seizures[  ];   paresthesias[  ];  difficulty walking[  ];  Psych:depression[  ]; anxiety[  ];  Endocrine: diabetes[ yes   ];  thyroid dysfunction[  ];  Immunizations: Flu [  ]; Pneumococcal[  ];  Other: left hand dominant  No problems with general anesthesia for bilateral knee replacement surgery  Physical Exam: BP (!) 180/80 (BP Location: Left Arm)   Pulse 65   Resp 18   Ht 6\' 1"  (1.854 m)   Wt 245 lb 11.2 oz (111.4 kg)   SpO2 97%   BMI 32.42 kg/m        Physical Exam  General: Obese 70-year-old Caucasian male no acute distress accompanied by family at bedside HEENT: Normocephalic pupils equal , dentition adequate Neck: Supple without JVD, adenopathy, or bruit Chest: Clear to auscultation, symmetrical breath sounds, no rhonchi, no tenderness             or deformity Cardiovascular: Regular rate and rhythm, no murmur, no gallop, peripheral pulses             palpable in all extremities Abdomen:  Soft, nontender, no palpable mass or  organomegaly Extremities: Warm, well-perfused, no clubbing cyanosis edema or tenderness, compression dressing over right radial artery from cardiac catheterization              no venous stasis changes of the legs Rectal/GU: Deferred Neuro: Grossly non--focal and symmetrical throughout Skin: Clean and dry without rash or ulceration   Diagnostic Studies & Laboratory data:     Recent Radiology Findings:   No results found.   chest x-ray personally reviewed shows interstitial edema with hypervascularity  I have independently reviewed the above radiologic studies.  Recent Lab Findings: Lab Results  Component Value Date   WBC 10.2 02/12/2016   HGB 13.0 02/12/2016   HCT 37.9 (L) 02/12/2016   PLT 149 (L) 02/12/2016   GLUCOSE 127 (H) 02/12/2016   CHOL 120 02/09/2016   TRIG 273 (H) 02/09/2016   HDL 23 (L) 02/09/2016   LDLCALC 42 02/09/2016   ALT 41 02/12/2016   AST 64 (H) 02/12/2016   NA 141 02/12/2016   K 4.7 02/12/2016   CL 112 (H) 02/12/2016   CREATININE 1.63 (H) 02/12/2016   BUN 24 (H) 02/12/2016   CO2 23 02/12/2016   INR 1.10 02/12/2016      Assessment / Plan:     70 year old gentleman with a non-ST elevation MI  Cardiac catheterization performed today   this demonstrates 90% ostial left main stenosis and critical three-vessel CAD with chronic occlusion of the RCA   he has diabetes with chronic kidney disease stage II  He has elevated LVEDP and chronic diastolic heart failure  Patient was recommended for multivessel CABG by his cardiologist. I agree with that recommendation. Patient is being prepared for multivessel CABG in a.m. I discussed the surgery in detail the patient and family including the location of the surgical incisions, the risks of stroke death bleeding infection and pulmonary problems including pleural effusion. He demonstrates his understanding and agrees to proceed with surgery under informed consent.     I   02/12/2016 5:52 PM

## 2016-02-12 NOTE — Discharge Summary (Signed)
James Clayton, is a 70 y.o. male  DOB 1945-06-26  MRN [id number].  Admission date:  02/09/2016  Admitting Physician  Ihor AustinPavan Pyreddy, MD  Discharge Date:  02/12/2016   Primary MD  Patriciaann ClanBUTLER,[name], DO  Recommendations for primary care physician for things to follow:   [facility]   Admission Diagnosis  NSTEMI (non-ST elevated myocardial infarction) (HCC) [I21.4] Essential hypertension [I10] Chest pain, unspecified type [R07.9]   Discharge Diagnosis  NSTEMI (non-ST elevated myocardial infarction) (HCC) [I21.4] Essential hypertension [I10] Chest pain, unspecified type [R07.9]   Principal Problem:   Non-STEMI (non-ST elevated myocardial infarction) [facility](HCC) Active Problems:   Hypertension   Hyperlipidemia      Past Medical History:  Diagnosis Date  . CAD (coronary artery disease)    a. LHC 02/12/16: ostLM-LM 90% severely calcified, ost-mLAD 90%, ostRamus-Ramus 80%, ostLCx-pLCx 90%, ostRCA-pRCA 100% severely calcified, LVEDP severely elevated; rec CABG  . Chronic diastolic CHF (congestive heart failure) (HCC)    a. echo 02/10/16: EF 50-55%, mild inf HK, GR2DD, mild to mod MR, PASP 46 mmHg  . CKD (chronic kidney disease), stage III   . Diabetes mellitus with complication (HCC)   . HLD (hyperlipidemia)   . Hypertension     Past Surgical History:  Procedure Laterality Date  . JOINT REPLACEMENT         History of present illness and  Hospital Course:     Kindly see H&P for history of present illness and admission details, please review complete Labs, Consult reports and Test reports for all details in brief  HPI  from the history and physical done on the day of admission 70 year old male patient with history of diabetes mellitus type 2, hypertensive heart disease,  chronic kidney disease stage III, hyperlipidemia admitted on November 24 for chest pain, elevated troponin up to 6.16, admitted to telemetry for chest pain, hypertensive emergency. Hospital Course  #1 chest pain secondary to non-ST elevation MI: Cardiac catheter showed multivessel coronary artery disease: [facility]. Continue patient on aspirin, statins, beta blockers. Patient cardiac catheter showed 90% stenosis of the left main, ostial LAD to mid LAD stenosis, LCx 90% stenosis, proximal RCA 100% stenosis patient received aspirin, beta blockers, heparin drip.. #2 chronic kidney disease stage III; she received IV hydration before the cardiac catheter to prevent contrast-induced nephropathy. #3 hyperlipidemia 4 diabetes mellitus type 2 #5.chronic diastolic chf;stable    Discharge Condition: stable   Follow UP      Discharge Instructions  and  Discharge Medications        Medication List    STOP taking these medications   sildenafil 20 MG tablet Commonly known as:  REVATIO     TAKE these medications   allopurinol 100 MG tablet Commonly known as:  ZYLOPRIM Take 2 tablets by mouth daily.   aspirin EC 81 MG tablet Take 81 mg by mouth daily.   atorvastatin 20 MG tablet Commonly known as:  LIPITOR Take 1 tablet by mouth daily.   carvedilol 25 MG tablet Commonly known as:  COREG Take 1 tablet by mouth 2 (two) times daily.   furosemide 40 MG tablet Commonly known as:  LASIX Take 40 mg by mouth every morning.   glipiZIDE 2.5 MG 24 hr tablet Commonly known as:  GLUCOTROL XL Take 2.5 mg by mouth daily.   losartan 100 MG tablet Commonly known as:  COZAAR Take 100 mg by mouth  daily.   terazosin 5 MG capsule Commonly known as:  HYTRIN Take 1 capsule by mouth at bedtime.         Diet and Activity recommendation: See Discharge Instructions above   Consults obtained - Cardiology   Major procedures and Radiology Reports - PLEASE  review detailed and final reports for all details, in brief -     Dg Chest 2 View  Result Date: 02/09/2016 CLINICAL DATA:  Left-sided chest pain radiating to both arms beginning at 0045 hours. Hypertension. EXAM: CHEST  2 VIEW COMPARISON:  None. FINDINGS: Shallow inspiration. Normal heart size and pulmonary vascularity. Diffuse interstitial pattern to the lungs with central predominance. This may represent bronchitic change, fibrosis, or edema. No focal consolidation or airspace disease. No blunting of costophrenic angles. No pneumothorax. Calcification of the aorta. IMPRESSION: Diffuse mostly central interstitial changes to the lungs could indicate bronchitic changes, fibrosis, or edema. No focal consolidation. Normal heart size. Electronically Signed   By: Burman NievesWilliam  Stevens M.D.   On: 02/09/2016 03:48    Micro Results     No results found for this or any previous visit (from the past 240 hour(s)).     Today   Subjective:   James ChimesJames Clayton today has no headache,no chest abdominal pain,no new weakness tingling or numbness, feels much better wants to go home today.   Objective:   Blood pressure (!) 160/98, pulse 68, temperature 97.8 F (36.6 C), temperature source Oral, resp. rate (!) 27, height 6\' 1"  (1.854 m), weight 110 kg (242 lb 9.6 oz), SpO2 96 %.   Intake/Output Summary (Last 24 hours) at 02/12/16 1034 Last data filed at 02/12/16 0602  Gross per 24 hour  Intake              240 ml  Output             1750 ml  Net            -1510 ml    Exam Awake Alert, Oriented x 3, No new F.N deficits, Normal affect [place].AT,PERRAL Supple Neck,No JVD, No cervical lymphadenopathy appriciated.  Symmetrical Chest wall movement, Good air movement bilaterally, CTAB RRR,No Gallops,Rubs or new Murmurs, No Parasternal Heave +ve B.Sounds, Abd Soft, Non tender, No organomegaly appriciated, No rebound -guarding or rigidity. No Cyanosis, Clubbing or edema, No new Rash or bruise  Data Review    CBC w Diff:  Lab Results  Component Value Date   WBC 10.2 02/12/2016   HGB 13.0 02/12/2016   HGB 13.0 12/16/2012   HCT 37.9 (L) 02/12/2016   HCT 43.7 12/01/2012   PLT 149 (L) 02/12/2016   PLT 165 12/16/2012    CMP:  Lab Results  Component Value Date   NA 140 02/12/2016   NA 138 12/17/2012   K 4.3 02/12/2016   K 3.8 12/17/2012   CL 113 (H) 02/12/2016   CL 109 (H) 12/17/2012   CO2 21 (L) 02/12/2016   CO2 24 12/17/2012   BUN 30 (H) 02/12/2016   BUN 20 (H) 12/17/2012   CREATININE 1.69 (H) 02/12/2016   CREATININE 1.77 (H) 12/17/2012  .   Total Time in preparing paper work, data evaluation and todays exam - 35 minutes  Lucie Friedlander M.D on 02/12/2016 at 10:34 AM    Note: This dictation was prepared with Dragon dictation along with smaller phrase technology. Any transcriptional errors that result from this process are unintentional.

## 2016-02-12 NOTE — Progress Notes (Signed)
Patient was medicated with oxycodone 5-325 mg at 0152 for chest pain of 5/10 on a pain scale. Pain med was effective. Patient has been resting well with his eyes closed, respirations even and unlabored. Heparin drip still infusing at 15 cc/hr, no bleeding/hematoma or any bruises noted. Will continue to monitor.

## 2016-02-12 NOTE — Progress Notes (Signed)
Pre-op Cardiac Surgery  Carotid Findings:   No evidence of a significant stenosis noted in bilateral carotid arteries.   Upper Extremity Right Left  Brachial Pressures 132 IV in place  Radial Waveforms Triphasic Triphasic  Ulnar Waveforms Triphasic Triphasic  Palmar Arch (Allen's Test) WNL WNL   Findings:  Doppler waveforms remain within normal limits with both radial and ulnar compressions.    Lower  Extremity Right Left  Dorsalis Pedis 106 128  Posterior Tibial 104 145  Ankle/Brachial Indices 0.80 1.1    Findings:  Right sided ABI is suggestive of mild arterial insufficiency disease at rest. Left sided ABI appeared normal at rest.  Marilynne Halstedita Fern Asmar, BS, RDMS, RVT

## 2016-02-13 ENCOUNTER — Inpatient Hospital Stay (HOSPITAL_COMMUNITY): Payer: Medicare Other | Admitting: Certified Registered Nurse Anesthetist

## 2016-02-13 ENCOUNTER — Encounter (HOSPITAL_COMMUNITY)
Admission: AD | Disposition: A | Discharge status: Home / Self Care | Payer: Self-pay | Source: Other Acute Inpatient Hospital | Attending: Cardiothoracic Surgery

## 2016-02-13 ENCOUNTER — Encounter: Payer: Self-pay | Admitting: Cardiovascular Disease

## 2016-02-13 ENCOUNTER — Inpatient Hospital Stay (HOSPITAL_COMMUNITY): Payer: Medicare Other

## 2016-02-13 DIAGNOSIS — I214 Non-ST elevation (NSTEMI) myocardial infarction: Secondary | ICD-10-CM

## 2016-02-13 DIAGNOSIS — Z951 Presence of aortocoronary bypass graft: Secondary | ICD-10-CM

## 2016-02-13 DIAGNOSIS — I25110 Atherosclerotic heart disease of native coronary artery with unstable angina pectoris: Secondary | ICD-10-CM

## 2016-02-13 HISTORY — PX: CORONARY ARTERY BYPASS GRAFT: SHX141

## 2016-02-13 HISTORY — PX: TEE WITHOUT CARDIOVERSION: SHX5443

## 2016-02-13 LAB — POCT I-STAT 3, ART BLOOD GAS (G3+)
ACID-BASE DEFICIT: 5 mmol/L — AB (ref 0.0–2.0)
ACID-BASE DEFICIT: 4 mmol/L — AB (ref 0.0–2.0)
ACID-BASE DEFICIT: 5 mmol/L — AB (ref 0.0–2.0)
Acid-base deficit: 4 mmol/L — ABNORMAL HIGH (ref 0.0–2.0)
Acid-base deficit: 5 mmol/L — ABNORMAL HIGH (ref 0.0–2.0)
BICARBONATE: 25.4 mmol/L (ref 20.0–28.0)
BICARBONATE: 20.4 mmol/L (ref 20.0–28.0)
BICARBONATE: 21.6 mmol/L (ref 20.0–28.0)
Bicarbonate: 21.9 mmol/L (ref 20.0–28.0)
Bicarbonate: 19.8 mmol/L — ABNORMAL LOW (ref 20.0–28.0)
Bicarbonate: 20.1 mmol/L (ref 20.0–28.0)
O2 SAT: 100 %
O2 SAT: 99 %
O2 SAT: 91 %
O2 Saturation: 100 %
O2 Saturation: 90 %
O2 Saturation: 94 %
PCO2 ART: 42.6 mmHg (ref 32.0–48.0)
PCO2 ART: 45.8 mmHg (ref 32.0–48.0)
PCO2 ART: 35.2 mmHg (ref 32.0–48.0)
PH ART: 7.32 — AB (ref 7.350–7.450)
PH ART: 7.384 (ref 7.350–7.450)
PH ART: 7.359 (ref 7.350–7.450)
PH ART: 7.365 (ref 7.350–7.450)
PO2 ART: 183 mmHg — AB (ref 83.0–108.0)
PO2 ART: 514 mmHg — AB (ref 83.0–108.0)
PO2 ART: 136 mmHg — AB (ref 83.0–108.0)
Patient temperature: 37.2
Patient temperature: 37.4
TCO2: 23 mmol/L (ref 0–100)
TCO2: 27 mmol/L (ref 0–100)
TCO2: 22 mmol/L (ref 0–100)
TCO2: 23 mmol/L (ref 0–100)
TCO2: 21 mmol/L (ref 0–100)
TCO2: 21 mmol/L (ref 0–100)
pCO2 arterial: 37.8 mmHg (ref 32.0–48.0)
pCO2 arterial: 35.6 mmHg (ref 32.0–48.0)
pCO2 arterial: 35.3 mmHg (ref 32.0–48.0)
pH, Arterial: 7.352 (ref 7.350–7.450)
pH, Arterial: 7.341 — ABNORMAL LOW (ref 7.350–7.450)
pO2, Arterial: 55 mmHg — ABNORMAL LOW (ref 83.0–108.0)
pO2, Arterial: 75 mmHg — ABNORMAL LOW (ref 83.0–108.0)
pO2, Arterial: 63 mmHg — ABNORMAL LOW (ref 83.0–108.0)

## 2016-02-13 LAB — CBC
HCT: 35.7 % — ABNORMAL LOW (ref 39.0–52.0)
HCT: 38.1 % — ABNORMAL LOW (ref 39.0–52.0)
HCT: 28.7 % — ABNORMAL LOW (ref 39.0–52.0)
HCT: 31 % — ABNORMAL LOW (ref 39.0–52.0)
HEMOGLOBIN: 11.6 g/dL — AB (ref 13.0–17.0)
Hemoglobin: 12.5 g/dL — ABNORMAL LOW (ref 13.0–17.0)
Hemoglobin: 9.5 g/dL — ABNORMAL LOW (ref 13.0–17.0)
Hemoglobin: 10.3 g/dL — ABNORMAL LOW (ref 13.0–17.0)
MCH: 29.6 pg (ref 26.0–34.0)
MCH: 29.3 pg (ref 26.0–34.0)
MCH: 29.5 pg (ref 26.0–34.0)
MCH: 29.3 pg (ref 26.0–34.0)
MCHC: 32.8 g/dL (ref 30.0–36.0)
MCHC: 32.5 g/dL (ref 30.0–36.0)
MCHC: 33.1 g/dL (ref 30.0–36.0)
MCHC: 33.2 g/dL (ref 30.0–36.0)
MCV: 90.1 fL (ref 78.0–100.0)
MCV: 90.2 fL (ref 78.0–100.0)
MCV: 89.1 fL (ref 78.0–100.0)
MCV: 88.3 fL (ref 78.0–100.0)
PLATELETS: 161 10*3/uL (ref 150–400)
PLATELETS: 101 10*3/uL — AB (ref 150–400)
Platelets: 169 10*3/uL (ref 150–400)
Platelets: 111 10*3/uL — ABNORMAL LOW (ref 150–400)
RBC: 3.96 MIL/uL — ABNORMAL LOW (ref 4.22–5.81)
RBC: 4.23 MIL/uL (ref 4.22–5.81)
RBC: 3.22 MIL/uL — AB (ref 4.22–5.81)
RBC: 3.51 MIL/uL — ABNORMAL LOW (ref 4.22–5.81)
RDW: 14.6 % (ref 11.5–15.5)
RDW: 14.4 % (ref 11.5–15.5)
RDW: 14.4 % (ref 11.5–15.5)
RDW: 14.3 % (ref 11.5–15.5)
WBC: 11.3 10*3/uL — ABNORMAL HIGH (ref 4.0–10.5)
WBC: 10.9 10*3/uL — ABNORMAL HIGH (ref 4.0–10.5)
WBC: 10.8 10*3/uL — AB (ref 4.0–10.5)
WBC: 12.1 10*3/uL — ABNORMAL HIGH (ref 4.0–10.5)

## 2016-02-13 LAB — POCT I-STAT, CHEM 8
BUN: 26 mg/dL — ABNORMAL HIGH (ref 6–20)
BUN: 22 mg/dL — AB (ref 6–20)
BUN: 26 mg/dL — AB (ref 6–20)
BUN: 25 mg/dL — AB (ref 6–20)
BUN: 25 mg/dL — AB (ref 6–20)
BUN: 22 mg/dL — ABNORMAL HIGH (ref 6–20)
CALCIUM ION: 1.07 mmol/L — AB (ref 1.15–1.40)
CALCIUM ION: 1.1 mmol/L — AB (ref 1.15–1.40)
CHLORIDE: 109 mmol/L (ref 101–111)
CHLORIDE: 110 mmol/L (ref 101–111)
CHLORIDE: 104 mmol/L (ref 101–111)
CREATININE: 1.1 mg/dL (ref 0.61–1.24)
CREATININE: 1.2 mg/dL (ref 0.61–1.24)
CREATININE: 1.5 mg/dL — AB (ref 0.61–1.24)
Calcium, Ion: 1.25 mmol/L (ref 1.15–1.40)
Calcium, Ion: 1.25 mmol/L (ref 1.15–1.40)
Calcium, Ion: 1.26 mmol/L (ref 1.15–1.40)
Calcium, Ion: 1.29 mmol/L (ref 1.15–1.40)
Chloride: 110 mmol/L (ref 101–111)
Chloride: 109 mmol/L (ref 101–111)
Chloride: 110 mmol/L (ref 101–111)
Creatinine, Ser: 1.4 mg/dL — ABNORMAL HIGH (ref 0.61–1.24)
Creatinine, Ser: 1.4 mg/dL — ABNORMAL HIGH (ref 0.61–1.24)
Creatinine, Ser: 1.7 mg/dL — ABNORMAL HIGH (ref 0.61–1.24)
GLUCOSE: 113 mg/dL — AB (ref 65–99)
GLUCOSE: 192 mg/dL — AB (ref 65–99)
Glucose, Bld: 124 mg/dL — ABNORMAL HIGH (ref 65–99)
Glucose, Bld: 124 mg/dL — ABNORMAL HIGH (ref 65–99)
Glucose, Bld: 132 mg/dL — ABNORMAL HIGH (ref 65–99)
Glucose, Bld: 149 mg/dL — ABNORMAL HIGH (ref 65–99)
HCT: 31 % — ABNORMAL LOW (ref 39.0–52.0)
HCT: 27 % — ABNORMAL LOW (ref 39.0–52.0)
HEMATOCRIT: 35 % — AB (ref 39.0–52.0)
HEMATOCRIT: 32 % — AB (ref 39.0–52.0)
HEMATOCRIT: 26 % — AB (ref 39.0–52.0)
HEMATOCRIT: 28 % — AB (ref 39.0–52.0)
HEMOGLOBIN: 11.9 g/dL — AB (ref 13.0–17.0)
HEMOGLOBIN: 8.8 g/dL — AB (ref 13.0–17.0)
HEMOGLOBIN: 9.5 g/dL — AB (ref 13.0–17.0)
Hemoglobin: 10.5 g/dL — ABNORMAL LOW (ref 13.0–17.0)
Hemoglobin: 10.9 g/dL — ABNORMAL LOW (ref 13.0–17.0)
Hemoglobin: 9.2 g/dL — ABNORMAL LOW (ref 13.0–17.0)
POTASSIUM: 4 mmol/L (ref 3.5–5.1)
POTASSIUM: 4.3 mmol/L (ref 3.5–5.1)
POTASSIUM: 4.4 mmol/L (ref 3.5–5.1)
POTASSIUM: 3.8 mmol/L (ref 3.5–5.1)
Potassium: 5.1 mmol/L (ref 3.5–5.1)
Potassium: 4.9 mmol/L (ref 3.5–5.1)
SODIUM: 143 mmol/L (ref 135–145)
SODIUM: 143 mmol/L (ref 135–145)
Sodium: 140 mmol/L (ref 135–145)
Sodium: 136 mmol/L (ref 135–145)
Sodium: 140 mmol/L (ref 135–145)
Sodium: 142 mmol/L (ref 135–145)
TCO2: 24 mmol/L (ref 0–100)
TCO2: 23 mmol/L (ref 0–100)
TCO2: 25 mmol/L (ref 0–100)
TCO2: 23 mmol/L (ref 0–100)
TCO2: 21 mmol/L (ref 0–100)
TCO2: 21 mmol/L (ref 0–100)

## 2016-02-13 LAB — PROTIME-INR
INR: 1.07
INR: 1.33
PROTHROMBIN TIME: 13.9 s (ref 11.4–15.2)
PROTHROMBIN TIME: 16.6 s — AB (ref 11.4–15.2)

## 2016-02-13 LAB — BASIC METABOLIC PANEL
Anion gap: 8 (ref 5–15)
BUN: 27 mg/dL — ABNORMAL HIGH (ref 6–20)
CO2: 21 mmol/L — ABNORMAL LOW (ref 22–32)
Calcium: 8.7 mg/dL — ABNORMAL LOW (ref 8.9–10.3)
Chloride: 112 mmol/L — ABNORMAL HIGH (ref 101–111)
Creatinine, Ser: 1.76 mg/dL — ABNORMAL HIGH (ref 0.61–1.24)
GFR calc Af Amer: 43 mL/min — ABNORMAL LOW (ref >60)
GFR calc non Af Amer: 37 mL/min — ABNORMAL LOW (ref >60)
Glucose, Bld: 127 mg/dL — ABNORMAL HIGH (ref 65–99)
Potassium: 4.4 mmol/L (ref 3.5–5.1)
Sodium: 141 mmol/L (ref 135–145)

## 2016-02-13 LAB — GLUCOSE, CAPILLARY
GLUCOSE-CAPILLARY: 118 mg/dL — AB (ref 65–99)
GLUCOSE-CAPILLARY: 173 mg/dL — AB (ref 65–99)
GLUCOSE-CAPILLARY: 154 mg/dL — AB (ref 65–99)
GLUCOSE-CAPILLARY: 130 mg/dL — AB (ref 65–99)
Glucose-Capillary: 165 mg/dL — ABNORMAL HIGH (ref 65–99)
Glucose-Capillary: 138 mg/dL — ABNORMAL HIGH (ref 65–99)
Glucose-Capillary: 149 mg/dL — ABNORMAL HIGH (ref 65–99)
Glucose-Capillary: 125 mg/dL — ABNORMAL HIGH (ref 65–99)

## 2016-02-13 LAB — MAGNESIUM
Magnesium: 2 mg/dL (ref 1.7–2.4)
Magnesium: 2.5 mg/dL — ABNORMAL HIGH (ref 1.7–2.4)

## 2016-02-13 LAB — CREATININE, SERUM
Creatinine, Ser: 1.71 mg/dL — ABNORMAL HIGH (ref 0.61–1.24)
GFR calc Af Amer: 45 mL/min — ABNORMAL LOW (ref >60)
GFR calc non Af Amer: 39 mL/min — ABNORMAL LOW (ref >60)

## 2016-02-13 LAB — HEMOGLOBIN A1C
Hgb A1c MFr Bld: 6.9 % — ABNORMAL HIGH (ref 4.8–5.6)
Mean Plasma Glucose: 151 mg/dL

## 2016-02-13 LAB — POCT I-STAT 4, (NA,K, GLUC, HGB,HCT)
GLUCOSE: 186 mg/dL — AB (ref 65–99)
HEMATOCRIT: 28 % — AB (ref 39.0–52.0)
HEMOGLOBIN: 9.5 g/dL — AB (ref 13.0–17.0)
Potassium: 4.2 mmol/L (ref 3.5–5.1)
Sodium: 144 mmol/L (ref 135–145)

## 2016-02-13 LAB — ECHO TEE
MV Peak grad: 3 mmHg
MV pk E vel: 92.2 m/s

## 2016-02-13 LAB — PLATELET COUNT: Platelets: 117 10*3/uL — ABNORMAL LOW (ref 150–400)

## 2016-02-13 LAB — LIPID PANEL
CHOL/HDL RATIO: 5 ratio
CHOLESTEROL: 105 mg/dL (ref 0–200)
HDL: 21 mg/dL — ABNORMAL LOW (ref >40)
LDL Cholesterol: 37 mg/dL (ref 0–99)
Triglycerides: 233 mg/dL — ABNORMAL HIGH (ref <150)
VLDL: 47 mg/dL — ABNORMAL HIGH (ref 0–40)

## 2016-02-13 LAB — TSH: TSH: 1.653 u[IU]/mL (ref 0.350–4.500)

## 2016-02-13 LAB — HEPARIN LEVEL (UNFRACTIONATED): Heparin Unfractionated: 0.16 IU/mL — ABNORMAL LOW (ref 0.30–0.70)

## 2016-02-13 LAB — HEMOGLOBIN AND HEMATOCRIT, BLOOD
HCT: 27.5 % — ABNORMAL LOW (ref 39.0–52.0)
Hemoglobin: 9 g/dL — ABNORMAL LOW (ref 13.0–17.0)

## 2016-02-13 LAB — APTT: aPTT: 31 seconds (ref 24–36)

## 2016-02-13 LAB — ABO/RH: ABO/RH(D): O POS

## 2016-02-13 SURGERY — CORONARY ARTERY BYPASS GRAFTING (CABG)
Anesthesia: General | Site: Chest

## 2016-02-13 MED ORDER — PROTAMINE SULFATE 10 MG/ML IV SOLN
INTRAVENOUS | Status: DC | PRN
  Administered 2016-02-13: 300 mg via INTRAVENOUS

## 2016-02-13 MED ORDER — ORAL CARE MOUTH RINSE: 15.0000 mL | OROMUCOSAL | Status: DC

## 2016-02-13 MED ORDER — SODIUM CHLORIDE 0.9 % IV SOLN
20.0000 ug | Freq: Once | INTRAVENOUS | Status: AC
  Administered 2016-02-13: 20 ug via INTRAVENOUS
  Filled 2016-02-13: qty 5

## 2016-02-13 MED ORDER — FENTANYL CITRATE (PF) 100 MCG/2ML IJ SOLN
INTRAMUSCULAR | Status: AC
  Filled 2016-02-13: qty 2

## 2016-02-13 MED ORDER — MORPHINE SULFATE (PF) 2 MG/ML IV SOLN
2.0000 mg | INTRAVENOUS | Status: DC | PRN
  Administered 2016-02-13 – 2016-02-14 (×3): 4 mg via INTRAVENOUS
  Administered 2016-02-15: 2 mg via INTRAVENOUS
  Filled 2016-02-13: qty 1
  Filled 2016-02-13 (×3): qty 2
  Filled 2016-02-13 (×2): qty 1

## 2016-02-13 MED ORDER — MILRINONE LACTATE IN DEXTROSE 20-5 MG/100ML-% IV SOLN
0.3750 ug/kg/min | INTRAVENOUS | Status: DC
  Filled 2016-02-13: qty 100

## 2016-02-13 MED ORDER — ALBUMIN HUMAN 5 % IV SOLN
INTRAVENOUS | Status: DC | PRN
  Administered 2016-02-13: 12:00:00 via INTRAVENOUS

## 2016-02-13 MED ORDER — ATORVASTATIN CALCIUM 40 MG PO TABS
40.0000 mg | ORAL_TABLET | Freq: Every day | ORAL | Status: DC
  Administered 2016-02-14 – 2016-02-25 (×11): 40 mg via ORAL
  Filled 2016-02-13 (×12): qty 1

## 2016-02-13 MED ORDER — MIDAZOLAM HCL 2 MG/2ML IJ SOLN
INTRAMUSCULAR | Status: AC
  Filled 2016-02-13: qty 2

## 2016-02-13 MED ORDER — LIDOCAINE HCL (CARDIAC) 20 MG/ML IV SOLN: INTRAVENOUS | Status: DC | PRN

## 2016-02-13 MED ORDER — AMIODARONE HCL IN DEXTROSE 360-4.14 MG/200ML-% IV SOLN
30.0000 mg/h | INTRAVENOUS | Status: DC
  Administered 2016-02-13: 30 mg/h via INTRAVENOUS
  Filled 2016-02-13: qty 200

## 2016-02-13 MED ORDER — FENTANYL CITRATE (PF) 250 MCG/5ML IJ SOLN
INTRAMUSCULAR | Status: AC
  Filled 2016-02-13: qty 25

## 2016-02-13 MED ORDER — ORAL CARE MOUTH RINSE
15.0000 mL | Freq: Two times a day (BID) | OROMUCOSAL | Status: DC
  Administered 2016-02-13 – 2016-02-20 (×10): 15 mL via OROMUCOSAL

## 2016-02-13 MED ORDER — ACETAMINOPHEN 500 MG PO TABS
1000.0000 mg | ORAL_TABLET | Freq: Four times a day (QID) | ORAL | Status: AC
  Administered 2016-02-14 – 2016-02-18 (×16): 1000 mg via ORAL
  Filled 2016-02-13 (×18): qty 2

## 2016-02-13 MED ORDER — SODIUM CHLORIDE 0.9 % IV SOLN: INTRAVENOUS | Status: DC

## 2016-02-13 MED ORDER — OXYCODONE HCL 5 MG PO TABS
5.0000 mg | ORAL_TABLET | ORAL | Status: DC | PRN
  Administered 2016-02-14 – 2016-02-15 (×2): 10 mg via ORAL
  Administered 2016-02-19: 5 mg via ORAL
  Filled 2016-02-13: qty 1
  Filled 2016-02-13 (×2): qty 2

## 2016-02-13 MED ORDER — DOPAMINE-DEXTROSE 3.2-5 MG/ML-% IV SOLN
3.0000 ug/kg/min | INTRAVENOUS | Status: DC
Start: 2016-02-13 — End: 2016-02-16
  Filled 2016-02-13: qty 250

## 2016-02-13 MED ORDER — METOCLOPRAMIDE HCL 5 MG/ML IJ SOLN
10.0000 mg | Freq: Four times a day (QID) | INTRAMUSCULAR | Status: DC
  Administered 2016-02-13 – 2016-02-15 (×10): 10 mg via INTRAVENOUS
  Filled 2016-02-13 (×10): qty 2

## 2016-02-13 MED ORDER — ROCURONIUM BROMIDE 100 MG/10ML IV SOLN: INTRAVENOUS | Status: DC | PRN

## 2016-02-13 MED ORDER — ASPIRIN 81 MG PO CHEW
324.0000 mg | CHEWABLE_TABLET | Freq: Every day | ORAL | Status: DC
  Administered 2016-02-20: 324 mg
  Filled 2016-02-13 (×2): qty 4

## 2016-02-13 MED ORDER — SODIUM CHLORIDE 0.9 % IJ SOLN
OROMUCOSAL | Status: DC | PRN
  Administered 2016-02-13 (×3): 4 mL via TOPICAL

## 2016-02-13 MED ORDER — ONDANSETRON HCL 4 MG/2ML IJ SOLN
4.0000 mg | Freq: Four times a day (QID) | INTRAMUSCULAR | Status: DC | PRN
  Administered 2016-02-13 – 2016-02-17 (×3): 4 mg via INTRAVENOUS
  Filled 2016-02-13 (×3): qty 2

## 2016-02-13 MED ORDER — HEPARIN SODIUM (PORCINE) 1000 UNIT/ML IJ SOLN
INTRAMUSCULAR | Status: DC | PRN
  Administered 2016-02-13: 3000 [IU] via INTRAVENOUS
  Administered 2016-02-13: 32000 [IU] via INTRAVENOUS

## 2016-02-13 MED ORDER — PROPOFOL 10 MG/ML IV BOLUS
INTRAVENOUS | Status: DC | PRN
  Administered 2016-02-13: 40 mg via INTRAVENOUS
  Administered 2016-02-13 (×2): 10 mg via INTRAVENOUS
  Administered 2016-02-13: 60 mg via INTRAVENOUS

## 2016-02-13 MED ORDER — HEMOSTATIC AGENTS (NO CHARGE) OPTIME
TOPICAL | Status: DC | PRN
  Administered 2016-02-13 (×2): 1 via TOPICAL

## 2016-02-13 MED ORDER — METOPROLOL TARTRATE 12.5 MG HALF TABLET
12.5000 mg | ORAL_TABLET | Freq: Two times a day (BID) | ORAL | Status: DC
  Administered 2016-02-14 (×2): 12.5 mg via ORAL
  Filled 2016-02-13 (×2): qty 1

## 2016-02-13 MED ORDER — LACTATED RINGERS IV SOLN
INTRAVENOUS | Status: DC | PRN
  Administered 2016-02-13 (×2): via INTRAVENOUS

## 2016-02-13 MED ORDER — ASPIRIN EC 325 MG PO TBEC
325.0000 mg | DELAYED_RELEASE_TABLET | Freq: Every day | ORAL | Status: DC
  Administered 2016-02-14 – 2016-02-26 (×11): 325 mg via ORAL
  Filled 2016-02-13 (×12): qty 1

## 2016-02-13 MED ORDER — MIDAZOLAM HCL 5 MG/5ML IJ SOLN
INTRAMUSCULAR | Status: DC | PRN
  Administered 2016-02-13 (×2): 2 mg via INTRAVENOUS

## 2016-02-13 MED ORDER — MILRINONE LACTATE IN DEXTROSE 20-5 MG/100ML-% IV SOLN
0.1250 ug/kg/min | INTRAVENOUS | Status: DC
  Administered 2016-02-15 (×2): 0.125 ug/kg/min via INTRAVENOUS
  Filled 2016-02-13 (×3): qty 100

## 2016-02-13 MED ORDER — SODIUM CHLORIDE 0.9% FLUSH: 3.0000 mL | INTRAVENOUS | Status: DC | PRN

## 2016-02-13 MED ORDER — SODIUM CHLORIDE 0.9 % IV SOLN: 250.0000 mL | INTRAVENOUS | Status: DC

## 2016-02-13 MED ORDER — MIDAZOLAM HCL 10 MG/2ML IJ SOLN
INTRAMUSCULAR | Status: AC
  Filled 2016-02-13: qty 2

## 2016-02-13 MED ORDER — LACTATED RINGERS IV SOLN
INTRAVENOUS | Status: DC | PRN
  Administered 2016-02-13 (×2): via INTRAVENOUS

## 2016-02-13 MED ORDER — METOPROLOL TARTRATE 25 MG/10 ML ORAL SUSPENSION: 12.5000 mg | Freq: Two times a day (BID) | ORAL | Status: DC

## 2016-02-13 MED ORDER — BACITRACIN-NEOMYCIN-POLYMYXIN OINTMENT TUBE
TOPICAL_OINTMENT | Freq: Every day | CUTANEOUS | Status: DC
  Administered 2016-02-19 – 2016-02-25 (×6): via TOPICAL
  Filled 2016-02-13 (×2): qty 15

## 2016-02-13 MED ORDER — LACTATED RINGERS IV SOLN
INTRAVENOUS | Status: DC | PRN
  Administered 2016-02-13: 07:00:00 via INTRAVENOUS

## 2016-02-13 MED ORDER — FAMOTIDINE IN NACL 20-0.9 MG/50ML-% IV SOLN
20.0000 mg | Freq: Two times a day (BID) | INTRAVENOUS | Status: AC
  Administered 2016-02-13: 20 mg via INTRAVENOUS

## 2016-02-13 MED ORDER — 0.9 % SODIUM CHLORIDE (POUR BTL) OPTIME
TOPICAL | Status: DC | PRN
  Administered 2016-02-13: 6000 mL
  Administered 2016-02-13: 1000 mL

## 2016-02-13 MED ORDER — SODIUM CHLORIDE 0.45 % IV SOLN
INTRAVENOUS | Status: DC | PRN
Start: 2016-02-13 — End: 2016-02-22
  Administered 2016-02-13: 20 mL/h via INTRAVENOUS
  Administered 2016-02-20: 10 mL/h via INTRAVENOUS

## 2016-02-13 MED ORDER — NOREPINEPHRINE BITARTRATE 1 MG/ML IV SOLN
0.0000 ug/min | INTRAVENOUS | Status: DC
  Administered 2016-02-13: 2 ug/min via INTRAVENOUS

## 2016-02-13 MED ORDER — PROPOFOL 10 MG/ML IV BOLUS
INTRAVENOUS | Status: AC
  Filled 2016-02-13: qty 20

## 2016-02-13 MED ORDER — ACETAMINOPHEN 160 MG/5ML PO SOLN: 650.0000 mg | Freq: Once | ORAL | Status: AC

## 2016-02-13 MED ORDER — ALBUMIN HUMAN 5 % IV SOLN: 250.0000 mL | INTRAVENOUS | Status: AC | PRN

## 2016-02-13 MED ORDER — INSULIN REGULAR BOLUS VIA INFUSION
0.0000 [IU] | Freq: Three times a day (TID) | INTRAVENOUS | Status: DC
  Filled 2016-02-13: qty 10

## 2016-02-13 MED ORDER — SODIUM CHLORIDE 0.9 % IV SOLN
30.0000 meq | Freq: Once | INTRAVENOUS | Status: DC
  Filled 2016-02-13: qty 15

## 2016-02-13 MED ORDER — DEXTROSE 5 % IV SOLN
0.0000 ug/min | INTRAVENOUS | Status: DC
Start: 2016-02-13 — End: 2016-02-13
  Filled 2016-02-13: qty 4

## 2016-02-13 MED ORDER — MORPHINE SULFATE (PF) 2 MG/ML IV SOLN
1.0000 mg | INTRAVENOUS | Status: AC | PRN
  Administered 2016-02-13 (×2): 2 mg via INTRAVENOUS

## 2016-02-13 MED ORDER — MILRINONE LACTATE IN DEXTROSE 20-5 MG/100ML-% IV SOLN
0.3750 ug/kg/min | INTRAVENOUS | Status: AC
  Administered 2016-02-13: .3 ug/kg/min via INTRAVENOUS
  Filled 2016-02-13: qty 100

## 2016-02-13 MED ORDER — NOREPINEPHRINE BITARTRATE 1 MG/ML IV SOLN: 0.0000 ug/min | INTRAVENOUS | Status: DC

## 2016-02-13 MED ORDER — AMIODARONE HCL IN DEXTROSE 360-4.14 MG/200ML-% IV SOLN
30.0000 mg/h | INTRAVENOUS | Status: DC
  Filled 2016-02-13: qty 200

## 2016-02-13 MED ORDER — ACETAMINOPHEN 160 MG/5ML PO SOLN: 1000.0000 mg | Freq: Four times a day (QID) | ORAL | Status: AC

## 2016-02-13 MED ORDER — VANCOMYCIN HCL IN DEXTROSE 1-5 GM/200ML-% IV SOLN
1000.0000 mg | Freq: Once | INTRAVENOUS | Status: AC
  Administered 2016-02-13: 1000 mg via INTRAVENOUS
  Filled 2016-02-13: qty 200

## 2016-02-13 MED ORDER — BISACODYL 10 MG RE SUPP
10.0000 mg | Freq: Every day | RECTAL | Status: DC
  Administered 2016-02-19: 10 mg via RECTAL
  Filled 2016-02-13 (×2): qty 1

## 2016-02-13 MED ORDER — ACETAMINOPHEN 650 MG RE SUPP
650.0000 mg | Freq: Once | RECTAL | Status: AC
Start: 2016-02-13 — End: 2016-02-13
  Administered 2016-02-13: 650 mg via RECTAL

## 2016-02-13 MED ORDER — NITROGLYCERIN IN D5W 200-5 MCG/ML-% IV SOLN: 2.0000 ug/min | INTRAVENOUS | Status: DC

## 2016-02-13 MED ORDER — DOCUSATE SODIUM 100 MG PO CAPS
200.0000 mg | ORAL_CAPSULE | Freq: Every day | ORAL | Status: DC
  Administered 2016-02-14 – 2016-02-26 (×9): 200 mg via ORAL
  Filled 2016-02-13 (×9): qty 2

## 2016-02-13 MED ORDER — STERILE WATER FOR IRRIGATION IR SOLN
Status: DC | PRN
  Administered 2016-02-13: 2000 mL

## 2016-02-13 MED ORDER — LACTATED RINGERS IV SOLN: INTRAVENOUS | Status: DC

## 2016-02-13 MED ORDER — MIDAZOLAM HCL 2 MG/2ML IJ SOLN: 2.0000 mg | INTRAMUSCULAR | Status: DC | PRN

## 2016-02-13 MED ORDER — SODIUM CHLORIDE 0.9% FLUSH
3.0000 mL | Freq: Two times a day (BID) | INTRAVENOUS | Status: DC
  Administered 2016-02-14 – 2016-02-20 (×7): 3 mL via INTRAVENOUS
  Administered 2016-02-21: 10 mL via INTRAVENOUS

## 2016-02-13 MED ORDER — AMIODARONE HCL IN DEXTROSE 360-4.14 MG/200ML-% IV SOLN
60.0000 mg/h | INTRAVENOUS | Status: AC
  Administered 2016-02-13: 30 mg/h via INTRAVENOUS
  Filled 2016-02-13: qty 200

## 2016-02-13 MED ORDER — LACTATED RINGERS IV SOLN
INTRAVENOUS | Status: DC
  Administered 2016-02-13: 19:00:00 via INTRAVENOUS

## 2016-02-13 MED ORDER — CALCIUM CHLORIDE 10 % IV SOLN
INTRAVENOUS | Status: DC | PRN
  Administered 2016-02-13: 300 mg via INTRAVENOUS
  Administered 2016-02-13: 200 mg via INTRAVENOUS
  Administered 2016-02-13: 300 mg via INTRAVENOUS
  Administered 2016-02-13: 200 mg via INTRAVENOUS

## 2016-02-13 MED ORDER — MAGNESIUM SULFATE 4 GM/100ML IV SOLN
4.0000 g | Freq: Once | INTRAVENOUS | Status: AC
  Administered 2016-02-13: 4 g via INTRAVENOUS
  Filled 2016-02-13: qty 100

## 2016-02-13 MED ORDER — LACTATED RINGERS IV SOLN: 500.0000 mL | Freq: Once | INTRAVENOUS | Status: DC | PRN

## 2016-02-13 MED ORDER — INSULIN REGULAR HUMAN 100 UNIT/ML IJ SOLN
INTRAMUSCULAR | Status: DC
  Administered 2016-02-13: 5.6 [IU]/h via INTRAVENOUS
  Filled 2016-02-13: qty 2.5

## 2016-02-13 MED ORDER — ROCURONIUM BROMIDE 10 MG/ML (PF) SYRINGE
PREFILLED_SYRINGE | INTRAVENOUS | Status: DC | PRN
  Administered 2016-02-13 (×7): 50 mg via INTRAVENOUS

## 2016-02-13 MED ORDER — CHLORHEXIDINE GLUCONATE 0.12 % MT SOLN
15.0000 mL | OROMUCOSAL | Status: AC
  Administered 2016-02-13: 15 mL via OROMUCOSAL

## 2016-02-13 MED ORDER — DEXTROSE 5 % IV SOLN
1.5000 g | Freq: Two times a day (BID) | INTRAVENOUS | Status: AC
  Administered 2016-02-13 – 2016-02-15 (×4): 1.5 g via INTRAVENOUS
  Filled 2016-02-13 (×4): qty 1.5

## 2016-02-13 MED ORDER — BISACODYL 5 MG PO TBEC
10.0000 mg | DELAYED_RELEASE_TABLET | Freq: Every day | ORAL | Status: DC
  Administered 2016-02-14 – 2016-02-18 (×3): 10 mg via ORAL
  Filled 2016-02-13 (×3): qty 2

## 2016-02-13 MED ORDER — PANTOPRAZOLE SODIUM 40 MG PO TBEC
40.0000 mg | DELAYED_RELEASE_TABLET | Freq: Every day | ORAL | Status: DC
  Administered 2016-02-15 – 2016-02-26 (×11): 40 mg via ORAL
  Filled 2016-02-13 (×11): qty 1

## 2016-02-13 MED ORDER — FENTANYL CITRATE (PF) 250 MCG/5ML IJ SOLN
INTRAMUSCULAR | Status: AC
  Filled 2016-02-13: qty 5

## 2016-02-13 MED ORDER — FENTANYL CITRATE (PF) 250 MCG/5ML IJ SOLN
INTRAMUSCULAR | Status: DC | PRN
  Administered 2016-02-13: 250 ug via INTRAVENOUS
  Administered 2016-02-13 (×3): 50 ug via INTRAVENOUS
  Administered 2016-02-13: 250 ug via INTRAVENOUS
  Administered 2016-02-13: 100 ug via INTRAVENOUS
  Administered 2016-02-13: 350 ug via INTRAVENOUS
  Administered 2016-02-13: 150 ug via INTRAVENOUS
  Administered 2016-02-13: 100 ug via INTRAVENOUS
  Administered 2016-02-13: 150 ug via INTRAVENOUS
  Administered 2016-02-13: 100 ug via INTRAVENOUS

## 2016-02-13 MED ORDER — DEXMEDETOMIDINE HCL IN NACL 200 MCG/50ML IV SOLN
0.0000 ug/kg/h | INTRAVENOUS | Status: DC
  Administered 2016-02-13: 0.3 ug/kg/h via INTRAVENOUS
  Filled 2016-02-13: qty 50

## 2016-02-13 MED ORDER — TRAMADOL HCL 50 MG PO TABS: 50.0000 mg | ORAL_TABLET | ORAL | Status: DC | PRN

## 2016-02-13 MED ORDER — METOPROLOL TARTRATE 5 MG/5ML IV SOLN
2.5000 mg | INTRAVENOUS | Status: DC | PRN
  Administered 2016-02-14 – 2016-02-15 (×3): 2.5 mg via INTRAVENOUS
  Administered 2016-02-15 – 2016-02-17 (×4): 5 mg via INTRAVENOUS
  Administered 2016-02-17 – 2016-02-18 (×3): 2.5 mg via INTRAVENOUS
  Administered 2016-02-18 – 2016-02-22 (×11): 5 mg via INTRAVENOUS
  Filled 2016-02-13 (×20): qty 5

## 2016-02-13 MED ORDER — CHLORHEXIDINE GLUCONATE 0.12% ORAL RINSE (MEDLINE KIT)
15.0000 mL | Freq: Two times a day (BID) | OROMUCOSAL | Status: DC
  Administered 2016-02-13: 15 mL via OROMUCOSAL

## 2016-02-13 MED FILL — Heparin Sodium (Porcine) Inj 1000 Unit/ML: INTRAMUSCULAR | Qty: 10 | Status: AC

## 2016-02-13 MED FILL — Electrolyte-R (PH 7.4) Solution: INTRAVENOUS | Qty: 4000 | Status: AC

## 2016-02-13 MED FILL — Lidocaine HCl IV Inj 20 MG/ML: INTRAVENOUS | Qty: 5 | Status: AC

## 2016-02-13 MED FILL — Magnesium Sulfate Inj 50%: INTRAMUSCULAR | Qty: 10 | Status: AC

## 2016-02-13 MED FILL — Sodium Chloride IV Soln 0.9%: INTRAVENOUS | Qty: 2000 | Status: AC

## 2016-02-13 MED FILL — Heparin Sodium (Porcine) Inj 1000 Unit/ML: INTRAMUSCULAR | Qty: 30 | Status: AC

## 2016-02-13 MED FILL — Sodium Bicarbonate IV Soln 8.4%: INTRAVENOUS | Qty: 50 | Status: AC

## 2016-02-13 MED FILL — Mannitol IV Soln 20%: INTRAVENOUS | Qty: 500 | Status: AC

## 2016-02-13 MED FILL — Potassium Chloride Inj 2 mEq/ML: INTRAVENOUS | Qty: 40 | Status: AC

## 2016-02-13 SURGICAL SUPPLY — 110 items
ADAPTER CARDIO PERF ANTE/RETRO (ADAPTER) ×4 IMPLANT
ARROWGARD BLUE PLUS TWO LUMEN CVC ×4 IMPLANT
BAG DECANTER FOR FLEXI CONT (MISCELLANEOUS) ×4 IMPLANT
BANDAGE ACE 4X5 VEL STRL LF (GAUZE/BANDAGES/DRESSINGS) IMPLANT
BANDAGE ACE 6X5 VEL STRL LF (GAUZE/BANDAGES/DRESSINGS) IMPLANT
BANDAGE ELASTIC 4 VELCRO ST LF (GAUZE/BANDAGES/DRESSINGS) ×4 IMPLANT
BANDAGE ELASTIC 6 VELCRO ST LF (GAUZE/BANDAGES/DRESSINGS) ×4 IMPLANT
BASKET HEART  (ORDER IN 25'S) (MISCELLANEOUS) ×1
BASKET HEART (ORDER IN 25'S) (MISCELLANEOUS) ×1
BASKET HEART (ORDER IN 25S) (MISCELLANEOUS) ×2 IMPLANT
BLADE MINI RND TIP GREEN BEAV (BLADE) ×4 IMPLANT
BLADE STERNUM SYSTEM 6 (BLADE) ×4 IMPLANT
BLADE SURG 11 STRL SS (BLADE) ×4 IMPLANT
BLADE SURG 12 STRL SS (BLADE) ×4 IMPLANT
BLADE SURG ROTATE 9660 (MISCELLANEOUS) IMPLANT
BNDG GAUZE ELAST 4 BULKY (GAUZE/BANDAGES/DRESSINGS) ×4 IMPLANT
CANISTER SUCTION 2500CC (MISCELLANEOUS) ×4 IMPLANT
CANNULA ARTERIAL NVNT 3/8 22FR (MISCELLANEOUS) ×4 IMPLANT
CANNULA GUNDRY RCSP 15FR (MISCELLANEOUS) ×4 IMPLANT
CATH CPB KIT VANTRIGT (MISCELLANEOUS) ×4 IMPLANT
CATH ROBINSON RED A/P 18FR (CATHETERS) ×12 IMPLANT
CATH THORACIC 36FR RT ANG (CATHETERS) ×4 IMPLANT
CLIP TI WIDE RED SMALL 24 (CLIP) ×4 IMPLANT
CRADLE DONUT ADULT HEAD (MISCELLANEOUS) ×4 IMPLANT
DERMABOND ADVANCED (GAUZE/BANDAGES/DRESSINGS) ×2
DERMABOND ADVANCED .7 DNX12 (GAUZE/BANDAGES/DRESSINGS) ×2 IMPLANT
DRAIN CHANNEL 32F RND 10.7 FF (WOUND CARE) ×4 IMPLANT
DRAPE CARDIOVASCULAR INCISE (DRAPES) ×2
DRAPE SLUSH/WARMER DISC (DRAPES) ×4 IMPLANT
DRAPE SRG 135X102X78XABS (DRAPES) ×2 IMPLANT
DRSG AQUACEL AG ADV 3.5X14 (GAUZE/BANDAGES/DRESSINGS) ×4 IMPLANT
ELECT BLADE 4.0 EZ CLEAN MEGAD (MISCELLANEOUS) ×4
ELECT BLADE 6.5 EXT (BLADE) ×4 IMPLANT
ELECT CAUTERY BLADE 6.4 (BLADE) ×4 IMPLANT
ELECT REM PT RETURN 9FT ADLT (ELECTROSURGICAL) ×8
ELECTRODE BLDE 4.0 EZ CLN MEGD (MISCELLANEOUS) ×2 IMPLANT
ELECTRODE REM PT RTRN 9FT ADLT (ELECTROSURGICAL) ×4 IMPLANT
FELT TEFLON 1X6 (MISCELLANEOUS) ×4 IMPLANT
GAUZE SPONGE 4X4 12PLY STRL (GAUZE/BANDAGES/DRESSINGS) IMPLANT
GLOVE BIO SURGEON STRL SZ 6 (GLOVE) ×12 IMPLANT
GLOVE BIO SURGEON STRL SZ7.5 (GLOVE) ×12 IMPLANT
GLOVE BIO SURGEON STRL SZ8.5 (GLOVE) ×4 IMPLANT
GLOVE BIOGEL M 6.5 STRL (GLOVE) ×12 IMPLANT
GLOVE BIOGEL M 7.0 STRL (GLOVE) IMPLANT
GLOVE BIOGEL PI IND STRL 6.5 (GLOVE) ×4 IMPLANT
GLOVE BIOGEL PI IND STRL 7.0 (GLOVE) ×4 IMPLANT
GLOVE BIOGEL PI INDICATOR 6.5 (GLOVE) ×4
GLOVE BIOGEL PI INDICATOR 7.0 (GLOVE) ×4
GLOVE SURG SS PI 6.0 STRL IVOR (GLOVE) ×8 IMPLANT
GOWN STRL REUS W/ TWL LRG LVL3 (GOWN DISPOSABLE) ×12 IMPLANT
GOWN STRL REUS W/TWL LRG LVL3 (GOWN DISPOSABLE) ×12
HEMOSTAT POWDER SURGIFOAM 1G (HEMOSTASIS) ×12 IMPLANT
HEMOSTAT SURGICEL 2X14 (HEMOSTASIS) ×4 IMPLANT
INSERT FOGARTY XLG (MISCELLANEOUS) IMPLANT
KIT BASIN OR (CUSTOM PROCEDURE TRAY) ×4 IMPLANT
KIT ROOM TURNOVER OR (KITS) ×4 IMPLANT
KIT SUCTION CATH 14FR (SUCTIONS) ×4 IMPLANT
KIT VASOVIEW HEMOPRO VH 3000 (KITS) ×4 IMPLANT
LEAD PACING MYOCARDI (MISCELLANEOUS) ×4 IMPLANT
MARKER GRAFT CORONARY BYPASS (MISCELLANEOUS) ×12 IMPLANT
NS IRRIG 1000ML POUR BTL (IV SOLUTION) ×28 IMPLANT
PACK OPEN HEART (CUSTOM PROCEDURE TRAY) ×4 IMPLANT
PAD ARMBOARD 7.5X6 YLW CONV (MISCELLANEOUS) ×8 IMPLANT
PAD ELECT DEFIB RADIOL ZOLL (MISCELLANEOUS) ×4 IMPLANT
PENCIL BUTTON HOLSTER BLD 10FT (ELECTRODE) ×4 IMPLANT
PUNCH AORTIC ROTATE  4.5MM 8IN (MISCELLANEOUS) ×4 IMPLANT
PUNCH AORTIC ROTATE 4.0MM (MISCELLANEOUS) IMPLANT
PUNCH AORTIC ROTATE 4.5MM 8IN (MISCELLANEOUS) IMPLANT
PUNCH AORTIC ROTATE 5MM 8IN (MISCELLANEOUS) IMPLANT
SENSOR MYOCARDIAL TEMP (MISCELLANEOUS) ×4 IMPLANT
SET CARDIOPLEGIA MPS 5001102 (MISCELLANEOUS) ×4 IMPLANT
SOLUTION ANTI FOG 6CC (MISCELLANEOUS) ×4 IMPLANT
SPONGE GAUZE 4X4 12PLY STER LF (GAUZE/BANDAGES/DRESSINGS) ×8 IMPLANT
SPONGE LAP 18X18 X RAY DECT (DISPOSABLE) ×8 IMPLANT
SURGIFLO W/THROMBIN 8M KIT (HEMOSTASIS) ×4 IMPLANT
SUT BONE WAX W31G (SUTURE) ×4 IMPLANT
SUT ETHIBOND 2 0 SH (SUTURE) ×2
SUT ETHIBOND 2 0 SH 36X2 (SUTURE) ×2 IMPLANT
SUT MNCRL AB 4-0 PS2 18 (SUTURE) ×4 IMPLANT
SUT PROLENE 3 0 SH DA (SUTURE) IMPLANT
SUT PROLENE 3 0 SH1 36 (SUTURE) IMPLANT
SUT PROLENE 4 0 RB 1 (SUTURE) ×2
SUT PROLENE 4 0 SH DA (SUTURE) ×4 IMPLANT
SUT PROLENE 4-0 RB1 .5 CRCL 36 (SUTURE) ×2 IMPLANT
SUT PROLENE 5 0 C 1 36 (SUTURE) IMPLANT
SUT PROLENE 6 0 C 1 30 (SUTURE) IMPLANT
SUT PROLENE 6 0 CC (SUTURE) ×20 IMPLANT
SUT PROLENE 8 0 BV175 6 (SUTURE) IMPLANT
SUT PROLENE BLUE 7 0 (SUTURE) ×8 IMPLANT
SUT SILK  1 MH (SUTURE)
SUT SILK 1 MH (SUTURE) IMPLANT
SUT SILK 2 0 SH CR/8 (SUTURE) ×4 IMPLANT
SUT SILK 3 0 SH CR/8 (SUTURE) IMPLANT
SUT STEEL 6MS V (SUTURE) ×4 IMPLANT
SUT STEEL SZ 6 DBL 3X14 BALL (SUTURE) ×4 IMPLANT
SUT VIC AB 1 CTX 36 (SUTURE) ×14
SUT VIC AB 1 CTX36XBRD ANBCTR (SUTURE) ×14 IMPLANT
SUT VIC AB 2-0 CT1 27 (SUTURE) ×2
SUT VIC AB 2-0 CT1 TAPERPNT 27 (SUTURE) ×2 IMPLANT
SUT VIC AB 2-0 CTX 27 (SUTURE) IMPLANT
SUT VIC AB 3-0 X1 27 (SUTURE) IMPLANT
SUTURE E-PAK OPEN HEART (SUTURE) ×4 IMPLANT
SYSTEM SAHARA CHEST DRAIN ATS (WOUND CARE) ×4 IMPLANT
TAPE CLOTH SURG 4X10 WHT LF (GAUZE/BANDAGES/DRESSINGS) ×8 IMPLANT
TOWEL OR 17X24 6PK STRL BLUE (TOWEL DISPOSABLE) ×8 IMPLANT
TOWEL OR 17X26 10 PK STRL BLUE (TOWEL DISPOSABLE) ×8 IMPLANT
TRAY FOLEY IC TEMP SENS 16FR (CATHETERS) ×4 IMPLANT
TUBING INSUFFLATION (TUBING) ×4 IMPLANT
UNDERPAD 30X30 (UNDERPADS AND DIAPERS) ×4 IMPLANT
WATER STERILE IRR 1000ML POUR (IV SOLUTION) ×8 IMPLANT

## 2016-02-13 NOTE — Procedures (Addendum)
Extubation Procedure Note  Patient Details:   Name: James Clayton DOB: [birth year] MRN: [id number]   Airway Documentation:  Airway 8 mm (Active)  Secured at (cm) 21 cm 02/13/2016  8:00 PM  Measured From Lips 02/13/2016  8:00 PM  Secured Location Right 02/13/2016  8:00 PM  Secured By Pink Tape 02/13/2016  8:00 PM  Cuff Pressure (cm H2O) 26 cm H2O 02/13/2016  2:45 PM  Site Condition Dry 02/13/2016  8:00 PM    Evaluation  O2 sats: stable throughout Complications: No apparent complications Patient did tolerate procedure well. Bilateral Breath Sounds: Rhonchi, Diminished   Yes, pt able to hoarsely vocalize name and cough to clear secretions. Pt positive for cuff leak before extubation. Pt had NIF of -41 and VC of 10L. Rt placed pt on 4L humidified nasal cannula and is tolerating well at this time.   James Clayton, James Clayton 02/13/2016, 9:40 PM

## 2016-02-13 NOTE — Anesthesia Preprocedure Evaluation (Addendum)
Anesthesia Evaluation  Patient identified by MRN, date of birth, ID band Patient awake    Reviewed: Allergy & Precautions, NPO status , Patient's Chart, lab work & pertinent test results  Airway Mallampati: II  TM Distance: <3 FB Neck ROM: Full    Dental  (+) Edentulous Upper   Pulmonary former smoker,    breath sounds clear to auscultation       Cardiovascular hypertension, + CAD, + Past MI and +CHF   Rhythm:Regular Rate:Normal     Neuro/Psych    GI/Hepatic negative GI ROS, Neg liver ROS,   Endo/Other  diabetes  Renal/GU Renal InsufficiencyRenal disease     Musculoskeletal negative musculoskeletal ROS (+)   Abdominal (+) + obese,   Peds  Hematology   Anesthesia Other Findings   Reproductive/Obstetrics                            Anesthesia Physical Anesthesia Plan  ASA: III  Anesthesia Plan: General   Post-op Pain Management:    Induction: Intravenous  Airway Management Planned: Oral ETT  Additional Equipment: Arterial line, PA Cath and TEE  Intra-op Plan:   Post-operative Plan: Post-operative intubation/ventilation  Informed Consent: I have reviewed the patients History and Physical, chart, labs and discussed the procedure including the risks, benefits and alternatives for the proposed anesthesia with the patient or authorized representative who has indicated his/her understanding and acceptance.     Plan Discussed with:   Anesthesia Plan Comments:         Anesthesia Quick Evaluation

## 2016-02-13 NOTE — Anesthesia Procedure Notes (Signed)
Procedure Name: Intubation Date/Time: 02/13/2016 7:52 AM Performed by: Adonis HousekeeperNGELL, Virgle Arth M Pre-anesthesia Checklist: Patient identified, Emergency Drugs available, Patient being monitored and Suction available Patient Re-evaluated:Patient Re-evaluated prior to inductionOxygen Delivery Method: Circle system utilized Preoxygenation: Pre-oxygenation with 100% oxygen Intubation Type: IV induction Ventilation: Mask ventilation without difficulty Laryngoscope Size: Mac and 4 Grade View: Grade I Tube type: Oral Tube size: 8.0 mm Number of attempts: 1 Airway Equipment and Method: Stylet Placement Confirmation: ETT inserted through vocal cords under direct vision,  positive ETCO2 and breath sounds checked- equal and bilateral Tube secured with: Tape Dental Injury: Teeth and Oropharynx as per pre-operative assessment

## 2016-02-13 NOTE — Anesthesia Procedure Notes (Signed)
Central Venous Catheter Insertion Performed by: anesthesiologist 02/13/2016 6:45 AM Patient location: Pre-op. Preanesthetic checklist: patient identified, IV checked, site marked, risks and benefits discussed, surgical consent, monitors and equipment checked, pre-op evaluation, timeout performed and anesthesia consent Position: Trendelenburg Lidocaine 1% used for infiltration Landmarks identified and Seldinger technique used Catheter size: 9 Fr Central line was placed.Sheath introducer Swan type and PA catheter depth:thermodilution and 50PA Cath depth:50 Procedure performed using ultrasound guided technique. Attempts: 1 Following insertion, line sutured and dressing applied. Post procedure assessment: blood return through all ports, free fluid flow and no air. Patient tolerated the procedure well with no immediate complications.

## 2016-02-13 NOTE — Progress Notes (Signed)
Notified RT of pt readiness to attempt wean #2. Will continue to monitor.

## 2016-02-13 NOTE — Progress Notes (Signed)
Rapid wean Protocol stated at this time

## 2016-02-13 NOTE — Brief Op Note (Signed)
02/12/2016 - 02/13/2016  11:53 AM  PATIENT:  James ChimesJames Alberto  70 y.o. male  PRE-OPERATIVE DIAGNOSIS:  CAD LEFT MAIN DISEASE  POST-OPERATIVE DIAGNOSIS:  CAD LEFT MAIN DISEASE  PROCEDURE:  Procedure(s): CORONARY ARTERY BYPASS GRAFTING (CABG)x 3 with endoscopic harvesting of right saphenous vein (N/A) TRANSESOPHAGEAL ECHOCARDIOGRAM (TEE) (N/A)  LIMA to LAD SVG to Ramus SVG to PDA  SURGEON:  Surgeon(s) and Role:    * Kerin PernaPeter Van Trigt, MD - Primary  PHYSICIAN ASSISTANT:  Jari Favreessa Analayah Brooke, PA-C   ANESTHESIA:   general  EBL:  Total I/O In: 1000 [I.V.:1000] Out: 400 [Urine:400]  BLOOD ADMINISTERED:none  DRAINS: ROUTINE   LOCAL MEDICATIONS USED:  NONE  SPECIMEN:  No Specimen  DISPOSITION OF SPECIMEN:  N/A  COUNTS:  YES  TOURNIQUET:  * No tourniquets in log *  DICTATION: .Other Dictation: Dictation Number [id number]  PLAN OF CARE: Admit to inpatient   PATIENT DISPOSITION:  ICU - intubated and hemodynamically stable.   Delay start of Pharmacological VTE agent (>24hrs) due to surgical blood loss or risk of bleeding: yes

## 2016-02-13 NOTE — Progress Notes (Signed)
The patient was examined and preop studies reviewed. There has been no change from the prior exam and the patient is ready for surgery.  plan CABG on James Clayton

## 2016-02-13 NOTE — Progress Notes (Signed)
  Echocardiogram Echocardiogram Transesophageal has been performed.  Daleah Coulson L Androw 02/13/2016, 9:15 AM 

## 2016-02-13 NOTE — Progress Notes (Signed)
Pt slept well during the night, NAD noted, unable to remove wedding ring, wife stated that the MD plans to remove the ring while the patient is in the OR.

## 2016-02-13 NOTE — Progress Notes (Signed)
CT surgery p.m. Rounds  Status post CABG 3 for critical left main stenosis and unstable angina Patient hemodynamically stable Still on ventilator because of tachypnea with wean attempt Postop labs are satisfactory Continue low-dose milrinone for preop pulmonary hypertension Continue IV amiodarone for intraoperative atrial fibrillation Follow renal function for preoperative creatinine of 1.7-2.0

## 2016-02-13 NOTE — Transfer of Care (Signed)
Immediate Anesthesia Transfer of Care Note  Patient: James Clayton  Procedure(s) Performed: Procedure(s): CORONARY ARTERY BYPASS GRAFTING (CABG)x 3 with endoscopic harvesting of right saphenous  vein LIMA to LAD SVG to Ramus SVG to PDA (N/A) TRANSESOPHAGEAL ECHOCARDIOGRAM (TEE) (N/A)  Patient Location: ICU  Anesthesia Type:General  Level of Consciousness: sedated and Patient remains intubated per anesthesia plan  Airway & Oxygen Therapy: Patient remains intubated per anesthesia plan and Patient placed on Ventilator (see vital sign flow sheet for setting)  Post-op Assessment: Report given to RN and Post -op Vital signs reviewed and stable  Post vital signs: Reviewed and stable BP 135/58 per aline; spo2 96%  Last Vitals:  Vitals:   02/13/16 0004 02/13/16 0438  BP: 124/62 132/67  Pulse: 63 66  Resp: 20 16  Temp: 36.6 C 36.9 C    Last Pain:  Vitals:   02/13/16 0438  TempSrc: Oral  PainSc:          Complications: No apparent anesthesia complications

## 2016-02-13 NOTE — Progress Notes (Signed)
Attempting to wean patient off vent. Rapid wean protocol started at 1740. Patient placed on cpap at 1800 after 10 minutes patient breathing 45RR and BP 190/100. Patient nodded head that yes he had pain. PRN morphine given nitro drip increased patient placed back on previous vent settings. Continue to monitor closely.

## 2016-02-13 NOTE — Progress Notes (Signed)
Placed on CPAP/PS by RT at this time. Will continue to monitor closely.  Herma ArdMOSELEY, Kyrin Gratz F, RN

## 2016-02-13 NOTE — Progress Notes (Signed)
ANTICOAGULATION CONSULT NOTE  Pharmacy Consult for heparin Indication: severe multivessel CAD  No Known Allergies  Patient Measurements: Height: 6\' 1"  (185.4 cm) Weight: 245 lb 11.2 oz (111.4 kg) IBW/kg (Calculated) : 79.9 Heparin Dosing Weight: 104 kg  Vital Signs: Temp: 97.8 F (36.6 C) (11/28 0004) Temp Source: Oral (11/28 0004) BP: 124/62 (11/28 0004) Pulse Rate: 63 (11/28 0004)  Labs:  Recent Labs  02/11/16 0424  02/11/16 2051 02/12/16 0429 02/12/16 1536 02/13/16 0009  HGB 13.4  --   --  13.0  --  11.6*  HCT 39.1*  --   --  37.9*  --  35.7*  PLT 149*  --   --  149*  --  161  APTT  --   --   --   --  32  --   LABPROT  --   --   --   --  14.2  --   INR  --   --   --   --  1.10  --   HEPARINUNFRC 0.29*  < > 0.34 0.30  --  0.16*  CREATININE 1.51*  --   --  1.69* 1.63*  --   TROPONINI 1.33*  --   --   --   --   --   < > = values in this interval not displayed.  Estimated Creatinine Clearance: 55.2 mL/min (by C-G formula based on SCr of 1.63 mg/dL (H)).   Assessment: 70 y.o. male with CAD awaiting CABG for heparin  Goal of Therapy:  Heparin level 0.3-0.7 units/ml Monitor platelets by anticoagulation protocol: Yes   Plan:  Increase Heparin  1900 units/hr F/U after CABG  Geannie RisenGreg Thedore Pickel, PharmD, BCPS  02/13/2016 1:48 AM

## 2016-02-14 ENCOUNTER — Inpatient Hospital Stay (HOSPITAL_COMMUNITY): Payer: Medicare Other

## 2016-02-14 LAB — POCT I-STAT, CHEM 8
BUN: 23 mg/dL — ABNORMAL HIGH (ref 6–20)
CALCIUM ION: 1.21 mmol/L (ref 1.15–1.40)
CHLORIDE: 106 mmol/L (ref 101–111)
Creatinine, Ser: 2.1 mg/dL — ABNORMAL HIGH (ref 0.61–1.24)
GLUCOSE: 148 mg/dL — AB (ref 65–99)
HCT: 31 % — ABNORMAL LOW (ref 39.0–52.0)
Hemoglobin: 10.5 g/dL — ABNORMAL LOW (ref 13.0–17.0)
Potassium: 3.9 mmol/L (ref 3.5–5.1)
SODIUM: 140 mmol/L (ref 135–145)
TCO2: 20 mmol/L (ref 0–100)

## 2016-02-14 LAB — GLUCOSE, CAPILLARY
GLUCOSE-CAPILLARY: 104 mg/dL — AB (ref 65–99)
GLUCOSE-CAPILLARY: 111 mg/dL — AB (ref 65–99)
GLUCOSE-CAPILLARY: 114 mg/dL — AB (ref 65–99)
GLUCOSE-CAPILLARY: 117 mg/dL — AB (ref 65–99)
GLUCOSE-CAPILLARY: 118 mg/dL — AB (ref 65–99)
GLUCOSE-CAPILLARY: 120 mg/dL — AB (ref 65–99)
GLUCOSE-CAPILLARY: 120 mg/dL — AB (ref 65–99)
GLUCOSE-CAPILLARY: 122 mg/dL — AB (ref 65–99)
GLUCOSE-CAPILLARY: 128 mg/dL — AB (ref 65–99)
GLUCOSE-CAPILLARY: 144 mg/dL — AB (ref 65–99)
GLUCOSE-CAPILLARY: 147 mg/dL — AB (ref 65–99)
Glucose-Capillary: 116 mg/dL — ABNORMAL HIGH (ref 65–99)
Glucose-Capillary: 113 mg/dL — ABNORMAL HIGH (ref 65–99)
Glucose-Capillary: 107 mg/dL — ABNORMAL HIGH (ref 65–99)
Glucose-Capillary: 126 mg/dL — ABNORMAL HIGH (ref 65–99)

## 2016-02-14 LAB — CREATININE, SERUM
CREATININE: 2.07 mg/dL — AB (ref 0.61–1.24)
GFR, EST AFRICAN AMERICAN: 36 mL/min — AB (ref >60)
GFR, EST NON AFRICAN AMERICAN: 31 mL/min — AB (ref >60)

## 2016-02-14 LAB — CBC
HCT: 30.9 % — ABNORMAL LOW (ref 39.0–52.0)
HCT: 31.1 % — ABNORMAL LOW (ref 39.0–52.0)
HEMOGLOBIN: 10.3 g/dL — AB (ref 13.0–17.0)
Hemoglobin: 10.1 g/dL — ABNORMAL LOW (ref 13.0–17.0)
MCH: 29.3 pg (ref 26.0–34.0)
MCH: 29.6 pg (ref 26.0–34.0)
MCHC: 32.7 g/dL (ref 30.0–36.0)
MCHC: 33.1 g/dL (ref 30.0–36.0)
MCV: 89.6 fL (ref 78.0–100.0)
MCV: 89.4 fL (ref 78.0–100.0)
PLATELETS: 132 10*3/uL — AB (ref 150–400)
PLATELETS: 113 10*3/uL — AB (ref 150–400)
RBC: 3.45 MIL/uL — ABNORMAL LOW (ref 4.22–5.81)
RBC: 3.48 MIL/uL — AB (ref 4.22–5.81)
RDW: 14.3 % (ref 11.5–15.5)
RDW: 14.9 % (ref 11.5–15.5)
WBC: 14 10*3/uL — AB (ref 4.0–10.5)
WBC: 15.6 10*3/uL — AB (ref 4.0–10.5)

## 2016-02-14 LAB — PREPARE FRESH FROZEN PLASMA
UNIT DIVISION: 0
Unit division: 0

## 2016-02-14 LAB — BASIC METABOLIC PANEL
Anion gap: 7 (ref 5–15)
BUN: 20 mg/dL (ref 6–20)
CALCIUM: 8.3 mg/dL — AB (ref 8.9–10.3)
CO2: 22 mmol/L (ref 22–32)
Chloride: 112 mmol/L — ABNORMAL HIGH (ref 101–111)
Creatinine, Ser: 1.81 mg/dL — ABNORMAL HIGH (ref 0.61–1.24)
GFR calc Af Amer: 42 mL/min — ABNORMAL LOW (ref >60)
GFR, EST NON AFRICAN AMERICAN: 36 mL/min — AB (ref >60)
GLUCOSE: 119 mg/dL — AB (ref 65–99)
Potassium: 4.1 mmol/L (ref 3.5–5.1)
Sodium: 141 mmol/L (ref 135–145)

## 2016-02-14 LAB — HEMOGLOBIN A1C
HEMOGLOBIN A1C: 6.9 % — AB (ref 4.8–5.6)
MEAN PLASMA GLUCOSE: 151 mg/dL

## 2016-02-14 LAB — MAGNESIUM
MAGNESIUM: 2.4 mg/dL (ref 1.7–2.4)
Magnesium: 2.5 mg/dL — ABNORMAL HIGH (ref 1.7–2.4)

## 2016-02-14 MED ORDER — INSULIN DETEMIR 100 UNIT/ML ~~LOC~~ SOLN
10.0000 [IU] | Freq: Two times a day (BID) | SUBCUTANEOUS | Status: DC
  Administered 2016-02-14 – 2016-02-16 (×6): 10 [IU] via SUBCUTANEOUS
  Filled 2016-02-14 (×8): qty 0.1

## 2016-02-14 MED ORDER — INSULIN ASPART 100 UNIT/ML ~~LOC~~ SOLN
0.0000 [IU] | SUBCUTANEOUS | Status: DC
  Administered 2016-02-14 – 2016-02-15 (×6): 2 [IU] via SUBCUTANEOUS
  Administered 2016-02-15: 4 [IU] via SUBCUTANEOUS
  Administered 2016-02-15 – 2016-02-20 (×20): 2 [IU] via SUBCUTANEOUS
  Administered 2016-02-20 – 2016-02-21 (×2): 4 [IU] via SUBCUTANEOUS

## 2016-02-14 MED ORDER — INSULIN DETEMIR 100 UNIT/ML ~~LOC~~ SOLN
10.0000 [IU] | Freq: Two times a day (BID) | SUBCUTANEOUS | Status: DC
  Filled 2016-02-14: qty 0.1

## 2016-02-14 MED ORDER — SODIUM CHLORIDE 0.9% FLUSH
10.0000 mL | INTRAVENOUS | Status: DC | PRN
  Administered 2016-02-17: 10 mL
  Filled 2016-02-14: qty 40

## 2016-02-14 MED ORDER — AMIODARONE HCL 200 MG PO TABS
200.0000 mg | ORAL_TABLET | Freq: Two times a day (BID) | ORAL | Status: DC
  Administered 2016-02-14 – 2016-02-15 (×4): 200 mg via ORAL
  Filled 2016-02-14 (×4): qty 1

## 2016-02-14 MED ORDER — FUROSEMIDE 10 MG/ML IJ SOLN
40.0000 mg | Freq: Every day | INTRAMUSCULAR | Status: DC
  Administered 2016-02-14 – 2016-02-16 (×3): 40 mg via INTRAVENOUS
  Filled 2016-02-14 (×3): qty 4

## 2016-02-14 MED ORDER — SODIUM CHLORIDE 0.9% FLUSH
10.0000 mL | Freq: Two times a day (BID) | INTRAVENOUS | Status: DC
  Administered 2016-02-14: 20 mL
  Administered 2016-02-15 – 2016-02-20 (×9): 10 mL
  Administered 2016-02-21: 20 mL

## 2016-02-14 NOTE — Op Note (Signed)
NAME:  James Clayton, James Clayton NO.:  [id number]  MEDICAL RECORD NO.:  [id number]  LOCATION:                                 FACILITY:  PHYSICIAN:  [name], M.D.  DATE OF BIRTH:  [birth year]  DATE OF PROCEDURE:  02/13/2016 DATE OF DISCHARGE:                              OPERATIVE REPORT   OPERATION: 1. Coronary artery bypass grafting x3 (left internal mammary artery to     left anterior descending, saphenous vein graft to posterior     descending, saphenous vein graft to ramus intermediate). 2. Endoscopic harvest of right leg greater saphenous vein.  SURGEON:  [name], M.D.  ASSISTANT:  Jari Favre, PA-C  ANESTHESIA:  General.  PREOPERATIVE DIAGNOSES: 1. Class IV unstable angina. 2. Non-ST-elevation myocardial infarction. 3. Severe three-vessel coronary artery disease.  POSTOPERATIVE DIAGNOSES: 1. Class IV unstable angina. 2. Non-ST-elevation myocardial infarction. 3. Severe three-vessel coronary artery disease.  CLINICAL NOTE:  The patient is a 70 year old obese diabetic who was admitted to [facility] at the end of last week for chest pain and shortness of breath with positive cardiac enzymes.  On Monday, he underwent cardiac catheterization, which demonstrated severe 3-vessel coronary artery disease.  The patient had not been on Plavix.  His LV function was mildly reduced.  He had elevated LVEDP of 35-38 mmHg.  He had critical left main stenosis with total occlusion of the RCA and was felt to be a candidate for urgent surgical coronary revascularization. I examined the patient and reviewed his cardiac cath films and echocardiogram images and counseled that information with the patient and his family.  The patient was found to have chronic renal insufficiency on his presentation with a creatinine 1.6 to 2.0.  I agreed with recommendation by his cardiologist for urgent coronary bypass grafting based on his critical coronary  anatomy.  Minimal dye was used for the cardiac cath and I felt that it was appropriate to proceed with surgery today.  I discussed the details of CABG completely with the patient and her family including the use of general anesthesia and cardiopulmonary bypass, the location of the surgical incisions, and the expected postoperative hospital recovery.  I reviewed with the patient the risks to him of heart bypass surgery including risk of stroke, MI, bleeding, blood transfusion requirement, postoperative infection, postoperative pulmonary problems including pleural effusion, postoperative arrhythmias, and death.  After reviewing these issues, he demonstrated his understanding and agreed to proceed with surgery.  He understood that because of his preoperative renal dysfunction, he was at increased risk for postoperative renal failure, perhaps requiring dialysis.  OPERATIVE FINDINGS: 1. Severely diseased coronaries. 2. Adequate conduit. 3. No blood cell transfusion required for the surgery. 4. Improved global LV function after separation from cardiopulmonary     bypass.  OPERATIVE PROCEDURE:  The patient was brought to the operating room and placed supine on the operating room table.  General anesthesia was induced under invasive hemodynamic monitoring.  The chest, abdomen, and legs were prepped with Betadine and draped as a sterile field.  A proper time-out was performed.  A sternal incision was made as the saphenous vein was harvested endoscopically from the right  leg.  The left internal mammary artery was harvested as a pedicle graft from its origin at the subclavian vessels.  It was a 1.5-mm vessel with good flow.  The sternal retractor was placed and the pericardium was opened and suspended. Pursestrings were placed in ascending aorta and after heparin had been administered and ACT was documented as being therapeutic, the patient was cannulated and placed on cardiopulmonary bypass.   The coronaries were identified for grafting.  The mammary artery and vein grafts were prepared for the distal anastomoses.  Cardioplegia cannulas were placed for both antegrade and retrograde cold blood cardioplegia.  The patient was cooled to 32 degrees and aortic crossclamp was applied.  A L of cold blood cardioplegia was delivered in split doses between the antegrade aortic and retrograde coronary sinus catheters.  There was good cardioplegic arrest and septal temperature dropped to less than 12 degrees.  Cardioplegia was delivered every 20 minutes.  The distal coronary anastomoses were performed.  The first distal anastomosis was to the posterior descending.  This was totally occluded. It was a 1.5-mm vessel, heavily diseased.  A reverse saphenous vein was sewn end-to-side with running 7-0 Prolene with good flow through the graft.  Cardioplegia was redosed.  The second distal anastomosis was to the ramus intermediate branch of the left coronary.  The distal circumflex marginals were too small to graft.  The ramus intermediate was intramyocardial with a proximal 80% stenosis.  A reverse saphenous vein was sewn end-to-side with running 7- 0 Prolene with good flow through the graft.  Cardioplegia was redosed.  The third distal anastomosis was to the midportion of the LAD.  There was a proximal 90% left main stenosis.  The LAD was 1.5 mm.  The left IMA pedicle was brought out through an opening and the left lateral pericardium was brought down onto the LAD and sewn end-to-side with a running 8-0 Prolene.  There was good flow through the anastomosis after briefly releasing the pedicle bulldog on the mammary artery.  The bulldog was reapplied and the pedicle was secured to the epicardium with 6-0 Prolene.  Cardioplegia was redosed.  With the crossclamp still in place, 2 proximal vein anastomoses were performed on the ascending aorta using a 4.5-mm punch running 6-0 Prolene.  Prior to  tying down the final proximal anastomosis, air was vented from the coronaries with a dose of retrograde warm blood cardioplegia.  The heart was cardioverted back to a regular rhythm.  The vein grafts were de-aired and opened.  Each had good flow and hemostasis was documented at the proximal and distal anastomoses.  Cardioplegia cannulas removed and pacing wires were applied.  The lungs were expanded.  The ventilator was resumed.  When the patient was adequately reperfused and rewarmed, he was weaned off cardiopulmonary bypass on low- dose milrinone.  Blood pressure and cardiac output were satisfactory. Echo showed improved global function.  Protamine was administered without adverse reaction.  The cannulas were removed.  The mediastinum was irrigated with warm saline.  The patient had moderate coagulopathy and was treated with 1 FFP transfusion with improved results in coagulation.  The superior pericardial fat was closed over the aorta. Anterior mediastinal and left pleural chest tube were placed and brought out through separate incisions.  The sternum was closed with wire.  The patient remained stable.  The pectoralis fascia was closed with a running #1 Vicryl.  The subcutaneous and skin layers were closed with running Vicryl.  Sterile dressings were applied.  Total cardiopulmonary bypass time was 110 minutes.     Kerin PernaPeter Van Trigt, M.D.   ______________________________ Kerin PernaPeter Van Trigt, M.D.    PV/MEDQ  D:  02/13/2016  T:  02/14/2016  Job:  [id number]

## 2016-02-14 NOTE — Progress Notes (Addendum)
Patient ID: James ChimesJames Fotopoulos, male   DOB: [birth year], 70 y.o.   MRN: [id number] EVENING ROUNDS NOTE :     [address]       [place] [id number]             [phone]                 1 Day Post-Op Procedure(s) (LRB): CORONARY ARTERY BYPASS GRAFTING (CABG)x 3 with endoscopic harvesting of right saphenous  vein LIMA to LAD SVG to Ramus SVG to PDA (N/A) TRANSESOPHAGEAL ECHOCARDIOGRAM (TEE) (N/A)  Total Length of Stay:  LOS: 2 days  BP 113/62   Pulse 76   Temp 98 F (36.7 C) (Oral)   Resp (!) 29   Ht 6\' 1"  (1.854 m)   Wt 261 lb 11 oz (118.7 kg)   SpO2 96%   BMI 34.53 kg/m   .Intake/Output      11/28 0701 - 11/29 0700 11/29 0701 - 11/30 0700   P.O.  380   I.V. (mL/kg) 3236 (27.3) 282.1 (2.4)   Blood 664    NG/GT 30    IV Piggyback 700    Total Intake(mL/kg) 4630 (39) 662.1 (5.6)   Urine (mL/kg/hr) 1470 (0.5) 540 (0.5)   Emesis/NG output 0 (0)    Stool     Blood 300 (0.1)    Chest Tube 350 (0.1) 150 (0.1)   Total Output 2120 690   Net +2510 -27.9        Emesis Occurrence 1 x      . sodium chloride Stopped (02/14/16 0900)  . sodium chloride    . sodium chloride    . amiodarone Stopped (02/14/16 [id number])  . dexmedetomidine Stopped (02/13/16 1732)  . DOPamine    . insulin (NOVOLIN-R) infusion 3.5 Units/hr (02/14/16 1300)  . lactated ringers Stopped (02/13/16 1900)  . lactated ringers 20 mL/hr at 02/14/16 1300  . milrinone 0.125 mcg/kg/min (02/14/16 1300)  . nitroGLYCERIN Stopped (02/14/16 1100)  . norepinephrine (LEVOPHED) Adult infusion Stopped (02/13/16 1430)     Lab Results  Component Value Date   WBC 14.0 (H) 02/14/2016   HGB 10.1 (L) 02/14/2016   HCT 30.9 (L) 02/14/2016   PLT 132 (L) 02/14/2016   GLUCOSE 119 (H) 02/14/2016   CHOL 105 02/13/2016   TRIG 233 (H) 02/13/2016   HDL 21 (L) 02/13/2016   LDLCALC 37 02/13/2016   ALT 41 02/12/2016   AST 64 (H) 02/12/2016   NA 141 02/14/2016   K 4.1 02/14/2016   CL 112 (H) 02/14/2016   CREATININE 1.81 (H) 02/14/2016   BUN 20 02/14/2016   CO2 22 02/14/2016   TSH 1.653 02/13/2016   INR 1.33 02/13/2016   HGBA1C 6.9 (H) 02/13/2016   Walked 300 feet still on milrinone  Cr now up to 2.1 ( 2.0  preop) Chronic Kidney Disease   Stage I     GFR >90  Stage II    GFR 60-89  Stage IIIA GFR 45-59  Stage IIIB GFR 30-44  Stage IV   GFR 15-29  Stage V    GFR  <15  Lab Results  Component Value Date   CREATININE 2.07 (H) 02/14/2016   Estimated Creatinine Clearance: 44.8 mL/min (by C-G formula based on SCr of 2.07 mg/dL (H)).  Delight OvensEdward B Daelynn Blower MD  Beeper [phone] Office [phone] 02/14/2016 4:42 PM

## 2016-02-14 NOTE — Anesthesia Postprocedure Evaluation (Signed)
Anesthesia Post Note  Patient: James ChimesJames Angelini  Procedure(s) Performed: Procedure(s) (LRB): CORONARY ARTERY BYPASS GRAFTING (CABG)x 3 with endoscopic harvesting of right saphenous  vein LIMA to LAD SVG to Ramus SVG to PDA (N/A) TRANSESOPHAGEAL ECHOCARDIOGRAM (TEE) (N/A)  Patient location during evaluation: SICU Anesthesia Type: General Level of consciousness: sedated and patient remains intubated per anesthesia plan Pain management: pain level controlled Vital Signs Assessment: post-procedure vital signs reviewed and stable Respiratory status: patient remains intubated per anesthesia plan Cardiovascular status: stable Anesthetic complications: no    Last Vitals:  Vitals:   02/14/16 0600 02/14/16 0700  BP: 108/64 104/60  Pulse: 81 81  Resp: 19 18  Temp: 37.2 C 37.2 C    Last Pain:  Vitals:   02/14/16 0545  TempSrc:   PainSc: 5                  Anastacio Bua,Saleh TERRILL

## 2016-02-14 NOTE — Progress Notes (Addendum)
[address]       [place] [id number]             [phone]      1 Day Post-Op Procedure(s) (LRB): CORONARY ARTERY BYPASS GRAFTING (CABG)x 3 with endoscopic harvesting of right saphenous  vein LIMA to LAD SVG to Ramus SVG to PDA (N/A) TRANSESOPHAGEAL ECHOCARDIOGRAM (TEE) (N/A) Subjective: Extubated. His pain is well controlled.    Objective: Vital signs in last 24 hours: Temp:  [95.7 F (35.4 C)-99.3 F (37.4 C)] 99 F (37.2 C) (11/29 0700) Pulse Rate:  [80-97] 81 (11/29 0700) Cardiac Rhythm: Normal sinus rhythm (11/29 0400) Resp:  [12-29] 18 (11/29 0700) BP: (100-160)/(55-95) 104/60 (11/29 0700) SpO2:  [93 %-100 %] 93 % (11/29 0700) Arterial Line BP: (119-179)/(52-88) 125/54 (11/29 0700) FiO2 (%):  [40 %-70 %] 40 % (11/28 2107) Weight:  [261 lb 11 oz (118.7 kg)] 261 lb 11 oz (118.7 kg) (11/29 0500)  Hemodynamic parameters for last 24 hours: PAP: (27-35)/(15-25) 35/18 CO:  [5.3 L/min-7.5 L/min] 6 L/min CI:  [2.3 L/min/m2-3.2 L/min/m2] 2.6 L/min/m2  Intake/Output from previous day: 11/28 0701 - 11/29 0700 In: 4630 [I.V.:3236; BJYNW:295Blood:664; NG/GT:30; IV Piggyback:700] Out: 2120 [Urine:1470; Blood:300; Chest Tube:350] Intake/Output this shift: No intake/output data recorded.  General appearance: alert, cooperative and no distress Heart: regular rate and rhythm, S1, S2 normal, no murmur, click, rub or gallop Lungs: clear to auscultation bilaterally and soft rhonchi in bilateral lobes greater on the left Abdomen: soft, non-tender; bowel sounds normal; no masses,  no organomegaly Extremities: extremities normal, atraumatic, no cyanosis or edema Wound: covered with a sterile dressing  Lab Results:  Recent Labs  02/13/16 2100 02/13/16 2104 02/14/16 0400  WBC 12.1*  --  14.0*  HGB 10.3* 9.5* 10.1*  HCT 31.0* 28.0* 30.9*  PLT 111*  --  132*   BMET:  Recent Labs  02/13/16 0508  02/13/16 2104 02/14/16 0400  NA 141  < > 142 141  K 4.4  < >  3.8 4.1  CL 112*  < > 110 112*  CO2 21*  --   --  22  GLUCOSE 127*  < > 149* 119*  BUN 27*  < > 22* 20  CREATININE 1.76*  < > 1.70* 1.81*  CALCIUM 8.7*  --   --  8.3*  < > = values in this interval not displayed.  PT/INR:  Recent Labs  02/13/16 1448  LABPROT 16.6*  INR 1.33   ABG    Component Value Date/Time   PHART 7.365 02/13/2016 2248   HCO3 20.1 02/13/2016 2248   TCO2 21 02/13/2016 2248   ACIDBASEDEF 5.0 (H) 02/13/2016 2248   O2SAT 91.0 02/13/2016 2248   CBG (last 3)   Recent Labs  02/14/16 0503 02/14/16 0558 02/14/16 0700  GLUCAP 114* 128* 120*    Assessment/Plan: S/P Procedure(s) (LRB): CORONARY ARTERY BYPASS GRAFTING (CABG)x 3 with endoscopic harvesting of right saphenous  vein LIMA to LAD SVG to Ramus SVG to PDA (N/A) TRANSESOPHAGEAL ECHOCARDIOGRAM (TEE) (N/A)  1. CV- Maps are good. HR 80s NSR. Remains on Milrinone and Nitro. Went into atrial fibrillation intra-op therefore remains on IV Amio, will switch to PO. Keep SBP between 100-130 for renal perfusion.  2. Pulm-Extubated on 4L [place]. Chest tubes 350ml over 24 hours. CXR shows atelectasis bilaterally. No pneumothorax.  3. Renal-making decent urine. Weight is up 15 lbs. Creatinine 1.8 this morning. Dopamine off 4. Endo-well controlled on current regimen 5. Acute  blood loss anemia-stable H and H 10.1/30.9 6. Leukocytosis-WBC from 12.1 to 14. Encourage Is. Will trend.     Plan: Discontinue swan-ganz catheter. Continue incentive spirometry. Remains on Milrinone and Nitro, will remain in the unit. Remains fluid overloaded- start diuresis. Will continue chest tubes due to output. Overall progressing well.    LOS: 2 days    Sharlene Doryessa N Zorah Backes 02/14/2016

## 2016-02-14 NOTE — Op Note (Deleted)
  The note originally documented on this encounter has been moved the the encounter in which it belongs.  

## 2016-02-15 ENCOUNTER — Inpatient Hospital Stay (HOSPITAL_COMMUNITY): Payer: Medicare Other

## 2016-02-15 LAB — GLUCOSE, CAPILLARY
GLUCOSE-CAPILLARY: 127 mg/dL — AB (ref 65–99)
GLUCOSE-CAPILLARY: 140 mg/dL — AB (ref 65–99)
GLUCOSE-CAPILLARY: 142 mg/dL — AB (ref 65–99)
GLUCOSE-CAPILLARY: 157 mg/dL — AB (ref 65–99)
GLUCOSE-CAPILLARY: 171 mg/dL — AB (ref 65–99)
Glucose-Capillary: 138 mg/dL — ABNORMAL HIGH (ref 65–99)
Glucose-Capillary: 149 mg/dL — ABNORMAL HIGH (ref 65–99)

## 2016-02-15 LAB — BASIC METABOLIC PANEL
Anion gap: 6 (ref 5–15)
BUN: 21 mg/dL — ABNORMAL HIGH (ref 6–20)
CALCIUM: 8.1 mg/dL — AB (ref 8.9–10.3)
CHLORIDE: 109 mmol/L (ref 101–111)
CO2: 23 mmol/L (ref 22–32)
CREATININE: 1.82 mg/dL — AB (ref 0.61–1.24)
GFR calc non Af Amer: 36 mL/min — ABNORMAL LOW (ref >60)
GFR, EST AFRICAN AMERICAN: 42 mL/min — AB (ref >60)
GLUCOSE: 147 mg/dL — AB (ref 65–99)
Potassium: 3.9 mmol/L (ref 3.5–5.1)
Sodium: 138 mmol/L (ref 135–145)

## 2016-02-15 LAB — CBC
HCT: 31.2 % — ABNORMAL LOW (ref 39.0–52.0)
Hemoglobin: 10.2 g/dL — ABNORMAL LOW (ref 13.0–17.0)
MCH: 29.7 pg (ref 26.0–34.0)
MCHC: 32.7 g/dL (ref 30.0–36.0)
MCV: 91 fL (ref 78.0–100.0)
Platelets: 121 10*3/uL — ABNORMAL LOW (ref 150–400)
RBC: 3.43 MIL/uL — AB (ref 4.22–5.81)
RDW: 14.8 % (ref 11.5–15.5)
WBC: 16.1 10*3/uL — AB (ref 4.0–10.5)

## 2016-02-15 MED ORDER — METOPROLOL TARTRATE 25 MG/10 ML ORAL SUSPENSION: 12.5000 mg | Freq: Two times a day (BID) | ORAL | Status: DC

## 2016-02-15 MED ORDER — TRAMADOL HCL 50 MG PO TABS
50.0000 mg | ORAL_TABLET | Freq: Two times a day (BID) | ORAL | Status: DC | PRN
  Administered 2016-02-17 – 2016-02-18 (×2): 50 mg via ORAL
  Filled 2016-02-15 (×2): qty 1

## 2016-02-15 MED ORDER — METOPROLOL TARTRATE 25 MG PO TABS
25.0000 mg | ORAL_TABLET | Freq: Two times a day (BID) | ORAL | Status: DC
  Administered 2016-02-15 – 2016-02-17 (×5): 25 mg via ORAL
  Filled 2016-02-15 (×7): qty 1

## 2016-02-15 MED ORDER — TERAZOSIN HCL 5 MG PO CAPS
5.0000 mg | ORAL_CAPSULE | Freq: Every day | ORAL | Status: DC
  Administered 2016-02-15 – 2016-02-25 (×11): 5 mg via ORAL
  Filled 2016-02-15 (×11): qty 1

## 2016-02-15 MED ORDER — LOSARTAN POTASSIUM 25 MG PO TABS
25.0000 mg | ORAL_TABLET | Freq: Every day | ORAL | Status: DC
  Administered 2016-02-15 – 2016-02-16 (×2): 25 mg via ORAL
  Filled 2016-02-15 (×3): qty 1

## 2016-02-15 NOTE — Progress Notes (Addendum)
EVENING ROUNDS NOTE :     [address]       [place] [id number]             [phone]                 2 Days Post-Op Procedure(s) (LRB): CORONARY ARTERY BYPASS GRAFTING (CABG)x 3 with endoscopic harvesting of right saphenous  vein LIMA to LAD SVG to Ramus SVG to PDA (N/A) TRANSESOPHAGEAL ECHOCARDIOGRAM (TEE) (N/A)  Total Length of Stay:  LOS: 3 days  BP (!) 145/68 (BP Location: Right Arm)   Pulse 88   Temp 98.8 F (37.1 C) (Oral)   Resp (!) 30   Ht 6\' 1"  (1.854 m)   Wt 261 lb 14.5 oz (118.8 kg)   SpO2 95%   BMI 34.55 kg/m   .Intake/Output      11/30 0701 - 12/01 0700   P.O.    I.V. (mL/kg) 290.4 (2.4)   Other    IV Piggyback    Total Intake(mL/kg) 290.4 (2.4)   Urine (mL/kg/hr) 1495 (1)   Chest Tube 90 (0.1)   Total Output 1585   Net -1294.6         . sodium chloride Stopped (02/14/16 0900)  . sodium chloride    . sodium chloride    . amiodarone Stopped (02/14/16 [id number])  . dexmedetomidine Stopped (02/13/16 1732)  . DOPamine    . lactated ringers Stopped (02/13/16 1900)  . lactated ringers 20 mL/hr at 02/15/16 1900  . milrinone 0.125 mcg/kg/min (02/15/16 1900)  . nitroGLYCERIN Stopped (02/14/16 1100)  . norepinephrine (LEVOPHED) Adult infusion Stopped (02/13/16 1430)     Lab Results  Component Value Date   WBC 16.1 (H) 02/15/2016   HGB 10.2 (L) 02/15/2016   HCT 31.2 (L) 02/15/2016   PLT 121 (L) 02/15/2016   GLUCOSE 147 (H) 02/15/2016   CHOL 105 02/13/2016   TRIG 233 (H) 02/13/2016   HDL 21 (L) 02/13/2016   LDLCALC 37 02/13/2016   ALT 41 02/12/2016   AST 64 (H) 02/12/2016   NA 138 02/15/2016   K 3.9 02/15/2016   CL 109 02/15/2016   CREATININE 1.82 (H) 02/15/2016   BUN 21 (H) 02/15/2016   CO2 23 02/15/2016   TSH 1.653 02/13/2016   INR 1.33 02/13/2016   HGBA1C 6.9 (H) 02/13/2016   Patient awake and alert. He was given a laxative and had a bowel movement.  PE: CV-RRR Pulmonary-Diminished at bases Wounds-Aquacel intact. RLE  wound clean and dry  Creatinine continues to decrease-now 1.82.  On low dose Dopamine.  Doree FudgeDonielle Rut Betterton PA-C 02/15/2016 7:59 PM

## 2016-02-15 NOTE — Progress Notes (Addendum)
[address]       [place] [id number]             [phone]      2 Days Post-Op Procedure(s) (LRB): CORONARY ARTERY BYPASS GRAFTING (CABG)x 3 with endoscopic harvesting of right saphenous  vein LIMA to LAD SVG to Ramus SVG to PDA (N/A) TRANSESOPHAGEAL ECHOCARDIOGRAM (TEE) (N/A) Subjective: Feels good this morning. Breathing better  Objective: Vital signs in last 24 hours: Temp:  [97.9 F (36.6 C)-98.6 F (37 C)] 98.5 F (36.9 C) (11/30 0400) Pulse Rate:  [76-92] 90 (11/30 0700) Cardiac Rhythm: Normal sinus rhythm (11/30 0400) Resp:  [17-32] 20 (11/30 0700) BP: (106-173)/(61-118) 173/86 (11/30 0700) SpO2:  [90 %-97 %] 92 % (11/30 0700) Arterial Line BP: (122-140)/(54-58) 129/58 (11/29 1100) Weight:  [261 lb 14.5 oz (118.8 kg)] 261 lb 14.5 oz (118.8 kg) (11/30 0500)  Hemodynamic parameters for last 24 hours: PAP: (35-36)/(19-20) 35/20 CO:  [5.3 L/min] 5.3 L/min CI:  [2.3 L/min/m2] 2.3 L/min/m2  Intake/Output from previous day: 11/29 0701 - 11/30 0700 In: 1241.2 [P.O.:380; I.V.:721.2; IV Piggyback:100] Out: 1590 [Urine:1310; Chest Tube:280] Intake/Output this shift: No intake/output data recorded.  General appearance: alert, cooperative and no distress Heart: regular rate and rhythm, S1, S2 normal, no murmur, click, rub or gallop Lungs: clear to auscultation bilaterally Abdomen: tympanic.  Extremities: extremities normal, atraumatic, no cyanosis or edema Wound: covered with sterile dressing  Lab Results:  Recent Labs  02/14/16 1650 02/15/16 0410  WBC 15.6* 16.1*  HGB 10.3* 10.2*  HCT 31.1* 31.2*  PLT 113* 121*   BMET:  Recent Labs  02/14/16 0400 02/14/16 1647 02/14/16 1650 02/15/16 0410  NA 141 140  --  138  K 4.1 3.9  --  3.9  CL 112* 106  --  109  CO2 22  --   --  23  GLUCOSE 119* 148*  --  147*  BUN 20 23*  --  21*  CREATININE 1.81* 2.10* 2.07* 1.82*  CALCIUM 8.3*  --   --  8.1*    PT/INR:  Recent Labs   02/13/16 1448  LABPROT 16.6*  INR 1.33   ABG    Component Value Date/Time   PHART 7.365 02/13/2016 2248   HCO3 20.1 02/13/2016 2248   TCO2 20 02/14/2016 1647   ACIDBASEDEF 5.0 (H) 02/13/2016 2248   O2SAT 91.0 02/13/2016 2248   CBG (last 3)   Recent Labs  02/14/16 1404 02/15/16 0006 02/15/16 0415  GLUCAP 144* 138* 142*    Assessment/Plan: S/P Procedure(s) (LRB): CORONARY ARTERY BYPASS GRAFTING (CABG)x 3 with endoscopic harvesting of right saphenous  vein LIMA to LAD SVG to Ramus SVG to PDA (N/A) TRANSESOPHAGEAL ECHOCARDIOGRAM (TEE) (N/A)  1. CV- NSR 90s. PO Amio. Remains on low dose Milrinone. BP elevated this morning. Increase oral Bb. Nitro off. Goal SBP 100-130 for renal perfusion.  2. Pulm-chest tubes put out 60 over the last 12 hours. Will discontinue chest tubes. No air leak.  Down to 2L  this morning. CXR shows no pneumothorax, no pleural effusion. He does have some pulmonary congestion.  3. Renal-creatinine 1.8. Making okay urine. Blood present near catheter site due to trauma. Can discontinue foley if patient willing to use portable urinal. Start home dose terazosin.  4. Abd- tympanic. Will monitor. Continue stool softeners. First solid meal this morning.  5. Endo-transition from IV insulin to SSI with Levemir 6. Acute blood loss anemia-stable H and H 7. Leukocytosis-WBC 16.1 this  morning. Afebrile. Will trend.   Plan: Discontinue chest tubes. Continue low-dose Milrinone. Increase BB for blood pressure. Remains fluid overloaded-continue diuresis. Foley cath out today if possible. Overall progressing well.      LOS: 3 days    James Clayton 02/15/2016 Patient seen and examined agree with above Just back from a walk and is a little winded Not much of an appetite Continue milrinone  Viviann SpareSteven C. Dorris FetchHendrickson, MD [facility] [phone]

## 2016-02-16 ENCOUNTER — Inpatient Hospital Stay (HOSPITAL_COMMUNITY): Payer: Medicare Other

## 2016-02-16 DIAGNOSIS — I480 Paroxysmal atrial fibrillation: Secondary | ICD-10-CM

## 2016-02-16 LAB — TYPE AND SCREEN
ABO/RH(D): O POS
Antibody Screen: NEGATIVE
Unit division: 0
Unit division: 0

## 2016-02-16 LAB — BASIC METABOLIC PANEL
ANION GAP: 8 (ref 5–15)
BUN: 27 mg/dL — ABNORMAL HIGH (ref 6–20)
CALCIUM: 8.3 mg/dL — AB (ref 8.9–10.3)
CO2: 24 mmol/L (ref 22–32)
Chloride: 106 mmol/L (ref 101–111)
Creatinine, Ser: 1.85 mg/dL — ABNORMAL HIGH (ref 0.61–1.24)
GFR, EST AFRICAN AMERICAN: 41 mL/min — AB (ref >60)
GFR, EST NON AFRICAN AMERICAN: 35 mL/min — AB (ref >60)
GLUCOSE: 128 mg/dL — AB (ref 65–99)
POTASSIUM: 3.4 mmol/L — AB (ref 3.5–5.1)
SODIUM: 138 mmol/L (ref 135–145)

## 2016-02-16 LAB — MAGNESIUM: Magnesium: 2.3 mg/dL (ref 1.7–2.4)

## 2016-02-16 LAB — CBC
HCT: 31.9 % — ABNORMAL LOW (ref 39.0–52.0)
Hemoglobin: 10.4 g/dL — ABNORMAL LOW (ref 13.0–17.0)
MCH: 29.3 pg (ref 26.0–34.0)
MCHC: 32.6 g/dL (ref 30.0–36.0)
MCV: 89.9 fL (ref 78.0–100.0)
PLATELETS: 153 10*3/uL (ref 150–400)
RBC: 3.55 MIL/uL — AB (ref 4.22–5.81)
RDW: 14.5 % (ref 11.5–15.5)
WBC: 15.3 10*3/uL — AB (ref 4.0–10.5)

## 2016-02-16 LAB — GLUCOSE, CAPILLARY
GLUCOSE-CAPILLARY: 142 mg/dL — AB (ref 65–99)
Glucose-Capillary: 123 mg/dL — ABNORMAL HIGH (ref 65–99)
Glucose-Capillary: 141 mg/dL — ABNORMAL HIGH (ref 65–99)
Glucose-Capillary: 151 mg/dL — ABNORMAL HIGH (ref 65–99)
Glucose-Capillary: 144 mg/dL — ABNORMAL HIGH (ref 65–99)
Glucose-Capillary: 121 mg/dL — ABNORMAL HIGH (ref 65–99)

## 2016-02-16 LAB — POCT I-STAT, CHEM 8
BUN: 30 mg/dL — ABNORMAL HIGH (ref 6–20)
Calcium, Ion: 1.14 mmol/L — ABNORMAL LOW (ref 1.15–1.40)
Chloride: 104 mmol/L (ref 101–111)
Creatinine, Ser: 1.8 mg/dL — ABNORMAL HIGH (ref 0.61–1.24)
Glucose, Bld: 156 mg/dL — ABNORMAL HIGH (ref 65–99)
HCT: 35 % — ABNORMAL LOW (ref 39.0–52.0)
Hemoglobin: 11.9 g/dL — ABNORMAL LOW (ref 13.0–17.0)
Potassium: 3.6 mmol/L (ref 3.5–5.1)
Sodium: 140 mmol/L (ref 135–145)
TCO2: 22 mmol/L (ref 0–100)

## 2016-02-16 LAB — COOXEMETRY PANEL
CARBOXYHEMOGLOBIN: 1.9 % — AB (ref 0.5–1.5)
METHEMOGLOBIN: 0.9 % (ref 0.0–1.5)
O2 SAT: 61.3 %
TOTAL HEMOGLOBIN: 10 g/dL — AB (ref 12.0–16.0)

## 2016-02-16 MED ORDER — DEXTROSE 5 % IV SOLN
10.0000 mg/h | INTRAVENOUS | Status: DC
  Administered 2016-02-16 – 2016-02-17 (×2): 10 mg/h via INTRAVENOUS
  Filled 2016-02-16 (×3): qty 100

## 2016-02-16 MED ORDER — SILVER SULFADIAZINE 1 % EX CREA
TOPICAL_CREAM | Freq: Two times a day (BID) | CUTANEOUS | Status: DC
  Administered 2016-02-16 – 2016-02-17 (×3): via TOPICAL
  Administered 2016-02-18: 1 via TOPICAL
  Administered 2016-02-18 – 2016-02-20 (×4): via TOPICAL
  Administered 2016-02-20 – 2016-02-21 (×2): 1 via TOPICAL
  Administered 2016-02-21 – 2016-02-25 (×7): via TOPICAL
  Filled 2016-02-16: qty 85

## 2016-02-16 MED ORDER — ENOXAPARIN SODIUM 30 MG/0.3ML ~~LOC~~ SOLN
30.0000 mg | SUBCUTANEOUS | Status: DC
  Administered 2016-02-16 – 2016-02-19 (×4): 30 mg via SUBCUTANEOUS
  Filled 2016-02-16 (×4): qty 0.3

## 2016-02-16 MED ORDER — POTASSIUM CHLORIDE CRYS ER 20 MEQ PO TBCR
20.0000 meq | EXTENDED_RELEASE_TABLET | Freq: Four times a day (QID) | ORAL | Status: AC
  Administered 2016-02-16 – 2016-02-17 (×2): 20 meq via ORAL
  Filled 2016-02-16 (×3): qty 1

## 2016-02-16 MED ORDER — AMIODARONE LOAD VIA INFUSION
150.0000 mg | Freq: Once | INTRAVENOUS | Status: DC
  Filled 2016-02-16: qty 83.34

## 2016-02-16 MED ORDER — AMIODARONE HCL 200 MG PO TABS
400.0000 mg | ORAL_TABLET | Freq: Two times a day (BID) | ORAL | Status: DC
  Administered 2016-02-16 – 2016-02-25 (×18): 400 mg via ORAL
  Filled 2016-02-16 (×20): qty 2

## 2016-02-16 MED ORDER — AMIODARONE IV BOLUS ONLY 150 MG/100ML
150.0000 mg | Freq: Once | INTRAVENOUS | Status: AC
  Administered 2016-02-16: 150 mg via INTRAVENOUS
  Filled 2016-02-16: qty 100

## 2016-02-16 MED ORDER — SODIUM CHLORIDE 0.9 % IV SOLN
30.0000 meq | Freq: Once | INTRAVENOUS | Status: AC
  Administered 2016-02-16: 30 meq via INTRAVENOUS
  Filled 2016-02-16: qty 15

## 2016-02-16 MED ORDER — LOPERAMIDE HCL 2 MG PO CAPS
2.0000 mg | ORAL_CAPSULE | ORAL | Status: DC | PRN
  Administered 2016-02-16: 2 mg via ORAL
  Filled 2016-02-16 (×2): qty 1

## 2016-02-16 NOTE — Progress Notes (Signed)
      [address]       [place] [id number]             [phone]      3 Days Post-Op Procedure(s) (LRB): CORONARY ARTERY BYPASS GRAFTING (CABG)x 3 with endoscopic harvesting of right saphenous  vein LIMA to LAD SVG to Ramus SVG to PDA (N/A) TRANSESOPHAGEAL ECHOCARDIOGRAM (TEE) (N/A) Subjective: Back into atrial fibrillation. He is not symptomatic.   Objective: Vital signs in last 24 hours: Temp:  [97.7 F (36.5 C)-98.8 F (37.1 C)] 97.7 F (36.5 C) (12/01 0400) Pulse Rate:  [87-138] 138 (12/01 0700) Cardiac Rhythm: Normal sinus rhythm (12/01 0000) Resp:  [23-42] 32 (12/01 0700) BP: (111-167)/(57-89) 130/84 (12/01 0700) SpO2:  [89 %-96 %] 94 % (12/01 0700) Weight:  [257 lb 15 oz (117 kg)] 257 lb 15 oz (117 kg) (12/01 0400)     Intake/Output from previous day: 11/30 0701 - 12/01 0700 In: 821.6 [I.V.:556.6; IV Piggyback:265] Out: 1960 [Urine:1870; Chest Tube:90] Intake/Output this shift: No intake/output data recorded.  General appearance: alert, cooperative and no distress Heart: irregularly irregular rhythm Lungs: clear to auscultation bilaterally and diminished in the lower lobes Abdomen: soft, non-tender; bowel sounds normal; no masses,  no organomegaly Extremities: extremities normal, atraumatic, no cyanosis or edema Wound: clean and dry EVH site. chest incision dressed with sterile dressing  Lab Results:  Recent Labs  02/15/16 0410 02/16/16 0416  WBC 16.1* 15.3*  HGB 10.2* 10.4*  HCT 31.2* 31.9*  PLT 121* 153   BMET:  Recent Labs  02/15/16 0410 02/16/16 0416  NA 138 138  K 3.9 3.4*  CL 109 106  CO2 23 24  GLUCOSE 147* 128*  BUN 21* 27*  CREATININE 1.82* 1.85*  CALCIUM 8.1* 8.3*    PT/INR:  Recent Labs  02/13/16 1448  LABPROT 16.6*  INR 1.33   ABG    Component Value Date/Time   PHART 7.365 02/13/2016 2248   HCO3 20.1 02/13/2016 2248   TCO2 20 02/14/2016 1647   ACIDBASEDEF 5.0 (H) 02/13/2016 2248   O2SAT 61.3  02/16/2016 0430   CBG (last 3)   Recent Labs  02/15/16 1941 02/15/16 2334 02/16/16 0423  GLUCAP 140* 127* 123*    Assessment/Plan: S/P Procedure(s) (LRB): CORONARY ARTERY BYPASS GRAFTING (CABG)x 3 with endoscopic harvesting of right saphenous  vein LIMA to LAD SVG to Ramus SVG to PDA (N/A) TRANSESOPHAGEAL ECHOCARDIOGRAM (TEE) (N/A)  1. CV- atrial fibrillation rate 130s. PO Amio. Remains on low dose Milrinone. Will re-bolus Amio and stop milrinone.  2. Pulm-chest tubes out. Down to 2L [place] this morning. CXR shows no pneumothorax, no pleural effusion. He does have some pulmonary congestion.  3. Renal-creatinine 1.8. Making good urine.  Can discontinue foley if patient willing to use portable urinal. On home dose terazosin.  4. Abd- several large bowel movements over the last 12 hours.  5. Endo-SSI with Levemir, good control 6. Acute blood loss anemia-stable H and H 7. Leukocytosis-WBC 15.3 this morning. Afebrile. Will trend.   Plan: Atrial fibrillation. Not responsive to Lopressor push. Will order Amio bolus x 1. Stop milrinone. If not responsive to Amio will switch to Cardizem pushes. Otherwise progressing well. Will try to remove foley catheter today. Hopefully to the floor tomorrow.      LOS: 4 days    Sharlene Doryessa N Jami Ohlin 02/16/2016

## 2016-02-16 NOTE — Progress Notes (Signed)
3 Days Post-Op Procedure(s) (LRB): CORONARY ARTERY BYPASS GRAFTING (CABG)x 3 with endoscopic harvesting of right saphenous  vein LIMA to LAD SVG to Ramus SVG to PDA (N/A) TRANSESOPHAGEAL ECHOCARDIOGRAM (TEE) (N/A) Subjective: Rapid afib after walking Creat 1.8 CXR clear Diarrhea with active bs  Objective: Vital signs in last 24 hours: Temp:  [97.7 F (36.5 C)-98.8 F (37.1 C)] 97.7 F (36.5 C) (12/01 0400) Pulse Rate:  [87-138] 138 (12/01 0700) Cardiac Rhythm: Normal sinus rhythm (12/01 0000) Resp:  [23-42] 32 (12/01 0700) BP: (111-167)/(57-89) 130/84 (12/01 0700) SpO2:  [89 %-96 %] 94 % (12/01 0700) Weight:  [257 lb 15 oz (117 kg)] 257 lb 15 oz (117 kg) (12/01 0400)  Hemodynamic parameters for last 24 hours:  rapid afib  Intake/Output from previous day: 11/30 0701 - 12/01 0700 In: 821.6 [I.V.:556.6; IV Piggyback:265] Out: 1960 [Urine:1870; Chest Tube:90] Intake/Output this shift: No intake/output data recorded.       Exam    General- alert and comfortable   Lungs- clear without rales, wheezes   Cor- regular rate and rhythm, no murmur , gallop   Abdomen- soft, non-tender   Extremities - warm, non-tender, minimal edema   Neuro- oriented, appropriate, no focal weakness   Lab Results:  Recent Labs  02/15/16 0410 02/16/16 0416  WBC 16.1* 15.3*  HGB 10.2* 10.4*  HCT 31.2* 31.9*  PLT 121* 153   BMET:  Recent Labs  02/15/16 0410 02/16/16 0416  NA 138 138  K 3.9 3.4*  CL 109 106  CO2 23 24  GLUCOSE 147* 128*  BUN 21* 27*  CREATININE 1.82* 1.85*  CALCIUM 8.1* 8.3*    PT/INR:  Recent Labs  02/13/16 1448  LABPROT 16.6*  INR 1.33   ABG    Component Value Date/Time   PHART 7.365 02/13/2016 2248   HCO3 20.1 02/13/2016 2248   TCO2 20 02/14/2016 1647   ACIDBASEDEF 5.0 (H) 02/13/2016 2248   O2SAT 61.3 02/16/2016 0430   CBG (last 3)   Recent Labs  02/15/16 1941 02/15/16 2334 02/16/16 0423  GLUCAP 140* 127* 123*    Assessment/Plan: S/P  Procedure(s) (LRB): CORONARY ARTERY BYPASS GRAFTING (CABG)x 3 with endoscopic harvesting of right saphenous  vein LIMA to LAD SVG to Ramus SVG to PDA (N/A) TRANSESOPHAGEAL ECHOCARDIOGRAM (TEE) (N/A) Mobilize Diuresis Continue foley due to patient in ICU and urinary output monitoring Iv cardizem for afib  LOS: 4 days    Kathlee Nationseter Van Trigt III 02/16/2016

## 2016-02-16 NOTE — Progress Notes (Signed)
      [address]       [place] [id number]             [phone]      Still in atrial fib, rate controlled with diltiazem drip  BP 127/64   Pulse 87   Temp 97.6 F (36.4 C) (Oral)   Resp (!) 25   Ht 6\' 1"  (1.854 m)   Wt 257 lb 15 oz (117 kg)   SpO2 95%   BMI 34.03 kg/m    Intake/Output Summary (Last 24 hours) at 02/16/16 1648 Last data filed at 02/16/16 1600  Gross per 24 hour  Intake           924.53 ml  Output             1370 ml  Net          -445.47 ml   Off milrinone  Continue current care  Humza Tallerico C. Dorris FetchHendrickson, MD [facility] [phone]

## 2016-02-16 NOTE — Progress Notes (Signed)
Patient Name: James ChimesJames Fergusson Date of Encounter: 02/16/2016  [facility] Cardi[facility]  Hospital Problem List     Principal Problem:   Non-STEMI (non-ST elevated myocardial infarction) [facility](HCC) Active Problems:   Hypertension   Hyperlipidemia   Diabetes mellitus with complication (HCC)   Chronic diastolic heart failure (HCC)   CAD in native artery   NSTEMI (non-ST elevated myocardial infarction) (HCC)   S/P CABG x 3     Subjective   Denies CP or dyspnea.  Inpatient Medications    Scheduled Meds: . acetaminophen  1,000 mg Oral Q6H   Or  . acetaminophen (TYLENOL) oral liquid 160 mg/5 mL  1,000 mg Per Tube Q6H  . amiodarone  400 mg Oral BID  . aspirin EC  325 mg Oral Daily   Or  . aspirin  324 mg Per Tube Daily  . atorvastatin  40 mg Oral q1800  . bisacodyl  10 mg Oral Daily   Or  . bisacodyl  10 mg Rectal Daily  . docusate sodium  200 mg Oral Daily  . furosemide  40 mg Intravenous Daily  . insulin aspart  0-24 Units Subcutaneous Q4H  . insulin detemir  10 Units Subcutaneous BID  . losartan  25 mg Oral Daily  . mouth rinse  15 mL Mouth Rinse BID  . metoprolol tartrate  25 mg Oral BID  . neomycin-bacitracin-polymyxin   Topical Daily  . pantoprazole  40 mg Oral Daily  . sodium chloride flush  10-40 mL Intracatheter Q12H  . sodium chloride flush  3 mL Intravenous Q12H  . terazosin  5 mg Oral QHS   Continuous Infusions: . sodium chloride Stopped (02/14/16 0900)  . sodium chloride    . sodium chloride    . diltiazem (CARDIZEM) infusion 10 mg/hr (02/16/16 1300)  . lactated ringers Stopped (02/13/16 1900)  . lactated ringers 20 mL/hr at 02/16/16 1300   PRN Meds: sodium chloride, loperamide, metoprolol, ondansetron (ZOFRAN) IV, oxyCODONE, sodium chloride flush, sodium chloride flush, traMADol   Vital Signs    Vitals:   02/16/16 1000 02/16/16 1100 02/16/16 1200 02/16/16 1300  BP: 139/79 136/73 (!) 142/80 (!) 133/100  Pulse: (!) 128 (!) 116 (!) 114 75    Resp: (!) 28 (!) 32 (!) 29 (!) 30  Temp:  98.2 F (36.8 C)    TempSrc:  Oral    SpO2: 98% 98% 96% 96%  Weight:      Height:        Intake/Output Summary (Last 24 hours) at 02/16/16 1346 Last data filed at 02/16/16 1300  Gross per 24 hour  Intake           907.13 ml  Output             1320 ml  Net          -412.87 ml   Filed Weights   02/14/16 0500 02/15/16 0500 02/16/16 0400  Weight: 261 lb 11 oz (118.7 kg) 261 lb 14.5 oz (118.8 kg) 257 lb 15 oz (117 kg)    Physical Exam   GEN: Well nourished, well developed, in no acute distress.  HEENT: Grossly normal.  Neck: Supple Cardiac: irregular Respiratory:  Mildly diminished BS bases GI: Soft, nontender, nondistended MS: no deformity or atrophy. Skin: warm and dry, no rash. Neuro:  Strength and sensation are intact. Psych: AAOx3.  Normal affect. Ext: 1 + edema  Labs    CBC  Recent Labs  02/15/16 0410 02/16/16 0416  WBC  16.1* 15.3*  HGB 10.2* 10.4*  HCT 31.2* 31.9*  MCV 91.0 89.9  PLT 121* 153   Basic Metabolic Panel  Recent Labs  02/14/16 0400  02/14/16 1650 02/15/16 0410 02/16/16 0416  NA 141  < >  --  138 138  K 4.1  < >  --  3.9 3.4*  CL 112*  < >  --  109 106  CO2 22  --   --  23 24  GLUCOSE 119*  < >  --  147* 128*  BUN 20  < >  --  21* 27*  CREATININE 1.81*  < > 2.07* 1.82* 1.85*  CALCIUM 8.3*  --   --  8.1* 8.3*  MG 2.5*  --  2.4  --   --   < > = values in this interval not displayed.   Telemetry    Atrial fibrillation - Personally Reviewed   Radiology    Dg Chest Port 1 View  Result Date: 02/16/2016 CLINICAL DATA:  Chest pain following coronary bypass grafting EXAM: PORTABLE CHEST 1 VIEW COMPARISON:  02/15/2016 FINDINGS: Cardiac shadow remains enlarged. Postsurgical changes are again seen. A right central venous line is again seen and stable. Mild vascular congestion is noted but has improved in the interval from the prior exam. No focal infiltrate or sizable effusion is noted. The  left thoracostomy catheter have been removed. No pneumothorax is noted. Mediastinal drain appears have been removed as well. IMPRESSION: No pneumothorax following chest tube removal. Mild vascular congestion. Electronically Signed   By: Alcide CleverMark  Lukens M.D.   On: 02/16/2016 08:05   Dg Chest Port 1 View  Result Date: 02/15/2016 CLINICAL DATA:  Chest tube present.  Coronary artery disease. EXAM: PORTABLE CHEST 1 VIEW COMPARISON:  February 14, 2016 FINDINGS: Left chest tube position unchanged. The Swan-Ganz catheter has been removed. Cordis tip is in the superior vena cava. There is also a right subclavian catheter with tip in superior vena cava. No pneumothorax. There is patchy atelectasis in both lung bases. Lungs elsewhere are clear. There is mild cardiomegaly with pulmonary venous hypertension. There is atherosclerotic calcification in the aorta. No adenopathy. IMPRESSION: Tube and catheter positions as described. No evident pneumothorax. Evidence of pulmonary vascular congestion without frank edema or consolidation. There is aortic atherosclerosis. Electronically Signed   By: Bretta BangWilliam  Woodruff III M.D.   On: 02/15/2016 07:36    Patient Profile     70 year old male with past medical history of diabetes mellitus, hypertension, hyperlipidemia, chronic kidney disease presenting with a non-ST elevation myocardial infarction. Cardiac catheterization in [place] showed severe coronary disease including left main. Ejection fraction 50-55% with mild-to-moderate mitral regurgitation by echocardiogram. Patient underwent coronary artery bypass graft on November 28.  Assessment & Plan   1 coronary artery disease status post coronary artery bypass graft- continue aspirin and statin.  2 postoperative volume excess-continue Lasix. Follow renal function closely.  3 postoperative atrial fibrillation-patient is in atrial fibrillation this morning. Continue amiodarone, Cardizem and metoprolol. If atrial fibrillation  persists patient would require anticoagulation.  4 chronic stage III kidney disease-follow renal function with diuresis.  Signed, Olga MillersBrian Crenshaw, MD  02/16/2016, 1:46 PM

## 2016-02-16 NOTE — Progress Notes (Signed)
Marginal urine o/p noted (25cc/3hr). Was reported from day shift that Pt foley catheter required advancement and irrigation due to the presence of clots after hematuria. Bladder scan performed that revealed 147 cc content in bladder. Pt without c/o bladder fullness, urgency or leaking around cath. Foley thoroughly cleansed and bulb deflated and advanced to hub without difficulty. Next irrigated with 40cc of sterile saline with subsequent return of that 40cc. Will continue to monitor and assess.

## 2016-02-16 NOTE — Care Management Note (Signed)
Case Management Note  Patient Details  Name: James Clayton MRN: [id number] Date of Birth: [birth year]  Subjective/Objective:  Pt lives with wife, daughter lives across the street and is a Engineer, civil (consulting)nurse, son lives nearby and is a Radiation protection practitionerparamedic.  Wife will provide 24/7 assistance, reports adult children are very supportive and will help as needed.  Pt has cane, walker, and BSC.                          Expected Discharge Plan:  Home/Self Care  Discharge planning Services  CM Consult  Status of Service:  In process, will continue to follow  Magdalene RiverMayo, Dolora Ridgely T, RN 02/16/2016, 10:12 AM

## 2016-02-16 NOTE — Care Management Important Message (Signed)
Important Message  Patient Details  Name: James ChimesJames Six MRN: [id number] Date of Birth: [birth year]   Medicare Important Message Given:  Yes    Kyla BalzarineShealy, Mekaela Azizi Abena 02/16/2016, 12:15 PM

## 2016-02-17 ENCOUNTER — Encounter (HOSPITAL_COMMUNITY): Payer: Self-pay | Admitting: *Deleted

## 2016-02-17 ENCOUNTER — Inpatient Hospital Stay (HOSPITAL_COMMUNITY): Payer: Medicare Other

## 2016-02-17 LAB — BASIC METABOLIC PANEL
ANION GAP: 8 (ref 5–15)
Anion gap: 8 (ref 5–15)
BUN: 34 mg/dL — ABNORMAL HIGH (ref 6–20)
BUN: 34 mg/dL — ABNORMAL HIGH (ref 6–20)
CHLORIDE: 107 mmol/L (ref 101–111)
CO2: 24 mmol/L (ref 22–32)
CO2: 24 mmol/L (ref 22–32)
Calcium: 7.9 mg/dL — ABNORMAL LOW (ref 8.9–10.3)
Calcium: 8.1 mg/dL — ABNORMAL LOW (ref 8.9–10.3)
Chloride: 107 mmol/L (ref 101–111)
Creatinine, Ser: 2.01 mg/dL — ABNORMAL HIGH (ref 0.61–1.24)
Creatinine, Ser: 1.76 mg/dL — ABNORMAL HIGH (ref 0.61–1.24)
GFR calc Af Amer: 37 mL/min — ABNORMAL LOW (ref >60)
GFR calc non Af Amer: 32 mL/min — ABNORMAL LOW (ref >60)
GFR calc non Af Amer: 37 mL/min — ABNORMAL LOW (ref >60)
GFR, EST AFRICAN AMERICAN: 43 mL/min — AB (ref >60)
GLUCOSE: 115 mg/dL — AB (ref 65–99)
Glucose, Bld: 140 mg/dL — ABNORMAL HIGH (ref 65–99)
POTASSIUM: 3.9 mmol/L (ref 3.5–5.1)
Potassium: 3.8 mmol/L (ref 3.5–5.1)
Sodium: 139 mmol/L (ref 135–145)
Sodium: 139 mmol/L (ref 135–145)

## 2016-02-17 LAB — GLUCOSE, CAPILLARY
GLUCOSE-CAPILLARY: 136 mg/dL — AB (ref 65–99)
GLUCOSE-CAPILLARY: 136 mg/dL — AB (ref 65–99)
GLUCOSE-CAPILLARY: 114 mg/dL — AB (ref 65–99)
GLUCOSE-CAPILLARY: 106 mg/dL — AB (ref 65–99)
Glucose-Capillary: 121 mg/dL — ABNORMAL HIGH (ref 65–99)
Glucose-Capillary: 94 mg/dL (ref 65–99)

## 2016-02-17 LAB — CBC
HCT: 29.8 % — ABNORMAL LOW (ref 39.0–52.0)
Hemoglobin: 9.7 g/dL — ABNORMAL LOW (ref 13.0–17.0)
MCH: 29.3 pg (ref 26.0–34.0)
MCHC: 32.6 g/dL (ref 30.0–36.0)
MCV: 90 fL (ref 78.0–100.0)
Platelets: 163 10*3/uL (ref 150–400)
RBC: 3.31 MIL/uL — ABNORMAL LOW (ref 4.22–5.81)
RDW: 14.7 % (ref 11.5–15.5)
WBC: 12.7 10*3/uL — ABNORMAL HIGH (ref 4.0–10.5)

## 2016-02-17 MED ORDER — DOPAMINE-DEXTROSE 3.2-5 MG/ML-% IV SOLN
2.5000 ug/kg/min | INTRAVENOUS | Status: DC
  Administered 2016-02-17 – 2016-02-18 (×2): 3 ug/kg/min via INTRAVENOUS
  Filled 2016-02-17 (×2): qty 250

## 2016-02-17 MED ORDER — METOCLOPRAMIDE HCL 5 MG/ML IJ SOLN
10.0000 mg | Freq: Four times a day (QID) | INTRAMUSCULAR | Status: AC
  Administered 2016-02-17 (×3): 10 mg via INTRAVENOUS
  Filled 2016-02-17: qty 2

## 2016-02-17 MED ORDER — METOCLOPRAMIDE HCL 5 MG/ML IJ SOLN
INTRAMUSCULAR | Status: AC
  Administered 2016-02-17: 5 mg
  Filled 2016-02-17: qty 2

## 2016-02-17 MED ORDER — METOCLOPRAMIDE HCL 5 MG/ML IJ SOLN
INTRAMUSCULAR | Status: AC
  Filled 2016-02-17: qty 2

## 2016-02-17 MED ORDER — SODIUM CHLORIDE 0.9 % IV SOLN
INTRAVENOUS | Status: AC
  Administered 2016-02-17: 100 mL/h via INTRAVENOUS
  Administered 2016-02-17 – 2016-02-18 (×2): 1 mL via INTRAVENOUS
  Administered 2016-02-19: 75 mL/h via INTRAVENOUS

## 2016-02-17 NOTE — Progress Notes (Signed)
Received call with results from carboxyhemoglobin.  RT, Neysa BonitoChristy,  wanted to call results since results may not cross over into EPIC in timely fashion.  Oxyhgb:  81.2 Caroboxyhgb:  1.2 MetHgb:  1.6 O2 sat:  83.5 Hgb:  9.5

## 2016-02-17 NOTE — Progress Notes (Signed)
      [address]       [place] [id number]             [phone]      Stable day  BP (!) 157/76   Pulse 77   Temp 98.3 F (36.8 C) (Oral)   Resp (!) 27   Ht 6\' 1"  (1.854 m)   Wt 257 lb 11.5 oz (116.9 kg)   SpO2 96%   BMI 34.00 kg/m    Intake/Output Summary (Last 24 hours) at 02/17/16 1721 Last data filed at 02/17/16 1600  Gross per 24 hour  Intake             2220 ml  Output              680 ml  Net             1540 ml   Abdomen remains distended and tympanitic, nontender, no pain  Co-ox= 81  BMET pending  Tramaine Snell C. Dorris FetchHendrickson, MD [facility] [phone]

## 2016-02-17 NOTE — Progress Notes (Signed)
Patient Name: James ChimesJames Clayton Date of Encounter: 02/17/2016     Principal Problem:   Non-STEMI (non-ST elevated myocardial infarction) [facility](HCC) Active Problems:   Hypertension   Hyperlipidemia   Diabetes mellitus with complication (HCC)   Chronic diastolic heart failure (HCC)   CAD in native artery   NSTEMI (non-ST elevated myocardial infarction) (HCC)   S/P CABG x 3    SUBJECTIVE  S/p cabg, improving.   CURRENT MEDS . acetaminophen  1,000 mg Oral Q6H   Or  . acetaminophen (TYLENOL) oral liquid 160 mg/5 mL  1,000 mg Per Tube Q6H  . amiodarone  400 mg Oral BID  . aspirin EC  325 mg Oral Daily   Or  . aspirin  324 mg Per Tube Daily  . atorvastatin  40 mg Oral q1800  . bisacodyl  10 mg Oral Daily   Or  . bisacodyl  10 mg Rectal Daily  . docusate sodium  200 mg Oral Daily  . enoxaparin (LOVENOX) injection  30 mg Subcutaneous Q24H  . insulin aspart  0-24 Units Subcutaneous Q4H  . mouth rinse  15 mL Mouth Rinse BID  . metoCLOPramide (REGLAN) injection  10 mg Intravenous Q6H  . metoprolol tartrate  25 mg Oral BID  . neomycin-bacitracin-polymyxin   Topical Daily  . pantoprazole  40 mg Oral Daily  . silver sulfADIAZINE   Topical BID  . sodium chloride flush  10-40 mL Intracatheter Q12H  . sodium chloride flush  3 mL Intravenous Q12H  . terazosin  5 mg Oral QHS    OBJECTIVE  Vitals:   02/17/16 0600 02/17/16 0700 02/17/16 0742 02/17/16 0800  BP: 133/67 115/64  120/73  Pulse: 73 72  68  Resp: (!) 33 20  (!) 21  Temp:   97.9 F (36.6 C)   TempSrc:   Oral   SpO2: 94% 93%  92%  Weight: 257 lb 11.5 oz (116.9 kg)     Height:        Intake/Output Summary (Last 24 hours) at 02/17/16 0852 Last data filed at 02/17/16 0700  Gross per 24 hour  Intake          1722.33 ml  Output             1315 ml  Net           407.33 ml   Filed Weights   02/15/16 0500 02/16/16 0400 02/17/16 0600  Weight: 261 lb 14.5 oz (118.8 kg) 257 lb 15 oz (117 kg) 257 lb 11.5 oz (116.9 kg)     PHYSICAL EXAM  General: Pleasant, median sternotomy clean and dry, NAD. Neuro: Alert and oriented X 3. Moves all extremities spontaneously. Psych: Normal affect. HEENT:  Normal  Neck: Supple without bruits or JVD. Lungs:  Resp regular and unlabored, CTA. Heart: RRR no s3, s4, or murmurs. Sternotomy as above Abdomen: Soft, non-tender, non-distended, BS + x 4.  Extremities: No clubbing, cyanosis, trace peripheral edema. DP/PT/Radials 2+ and equal bilaterally.  Accessory Clinical Findings  CBC  Recent Labs  02/16/16 0416 02/16/16 1858 02/17/16 0352  WBC 15.3*  --  12.7*  HGB 10.4* 11.9* 9.7*  HCT 31.9* 35.0* 29.8*  MCV 89.9  --  90.0  PLT 153  --  163   Basic Metabolic Panel  Recent Labs  02/14/16 1650  02/16/16 0416 02/16/16 1858 02/16/16 1900 02/17/16 0352  NA  --   < > 138 140  --  139  K  --   < >  3.4* 3.6  --  3.8  CL  --   < > 106 104  --  107  CO2  --   < > 24  --   --  24  GLUCOSE  --   < > 128* 156*  --  140*  BUN  --   < > 27* 30*  --  34*  CREATININE 2.07*  < > 1.85* 1.80*  --  2.01*  CALCIUM  --   < > 8.3*  --   --  7.9*  MG 2.4  --   --   --  2.3  --   < > = values in this interval not displayed. Liver Function Tests No results for input(s): AST, ALT, ALKPHOS, BILITOT, PROT, ALBUMIN in the last 72 hours. No results for input(s): LIPASE, AMYLASE in the last 72 hours. Cardiac Enzymes No results for input(s): CKTOTAL, CKMB, CKMBINDEX, TROPONINI in the last 72 hours. BNP Invalid input(s): POCBNP D-Dimer No results for input(s): DDIMER in the last 72 hours. Hemoglobin A1C No results for input(s): HGBA1C in the last 72 hours. Fasting Lipid Panel No results for input(s): CHOL, HDL, LDLCALC, TRIG, CHOLHDL, LDLDIRECT in the last 72 hours. Thyroid Function Tests No results for input(s): TSH, T4TOTAL, T3FREE, THYROIDAB in the last 72 hours.  Invalid input(s): FREET3  TELE  nsr  Radiology/Studies  Dg Chest 2 View  Result Date:  02/17/2016 CLINICAL DATA:  Shortness of breath. EXAM: CHEST  2 VIEW COMPARISON:  February 16, 2016 FINDINGS: The right central line is stable. No pneumothorax. Mild atelectasis and small effusions. No overt edema. Stable cardiomegaly. The hila and mediastinum are unchanged. There is a loop of bowel in the left upper quadrant which is changed in the interval. A rigler's sign is not completely excluded. However, I spoke to the patient's nurse Victorino Dike and the patient is having no abdominal symptoms. That being said, he is scheduled for an abdominal film and an upright or decubitus film is recommended to exclude abdominal free air with confidence. No other interval changes or acute abnormalities. IMPRESSION: 1. Small effusions and bibasilar atelectasis. 2. Mildly prominent loop of bowel with a possible Rigler's sign. Recommend an upright or decubitus abdominal film to confidently exclude free air. These findings were discussed with the patient's nurse, Victorino Dike, and the patient is having no abdominal symptoms at this time. Electronically Signed   By: Gerome Sam III M.D   On: 02/17/2016 08:22   Dg Chest 2 View  Result Date: 02/09/2016 CLINICAL DATA:  Left-sided chest pain radiating to both arms beginning at 0045 hours. Hypertension. EXAM: CHEST  2 VIEW COMPARISON:  None. FINDINGS: Shallow inspiration. Normal heart size and pulmonary vascularity. Diffuse interstitial pattern to the lungs with central predominance. This may represent bronchitic change, fibrosis, or edema. No focal consolidation or airspace disease. No blunting of costophrenic angles. No pneumothorax. Calcification of the aorta. IMPRESSION: Diffuse mostly central interstitial changes to the lungs could indicate bronchitic changes, fibrosis, or edema. No focal consolidation. Normal heart size. Electronically Signed   By: Burman Nieves M.D.   On: 02/09/2016 03:48   Dg Chest Port 1 View  Result Date: 02/16/2016 CLINICAL DATA:  Chest pain  following coronary bypass grafting EXAM: PORTABLE CHEST 1 VIEW COMPARISON:  02/15/2016 FINDINGS: Cardiac shadow remains enlarged. Postsurgical changes are again seen. A right central venous line is again seen and stable. Mild vascular congestion is noted but has improved in the interval from the prior exam. No focal infiltrate  or sizable effusion is noted. The left thoracostomy catheter have been removed. No pneumothorax is noted. Mediastinal drain appears have been removed as well. IMPRESSION: No pneumothorax following chest tube removal. Mild vascular congestion. Electronically Signed   By: Alcide CleverMark  Lukens M.D.   On: 02/16/2016 08:05   Dg Chest Port 1 View  Result Date: 02/15/2016 CLINICAL DATA:  Chest tube present.  Coronary artery disease. EXAM: PORTABLE CHEST 1 VIEW COMPARISON:  February 14, 2016 FINDINGS: Left chest tube position unchanged. The Swan-Ganz catheter has been removed. Cordis tip is in the superior vena cava. There is also a right subclavian catheter with tip in superior vena cava. No pneumothorax. There is patchy atelectasis in both lung bases. Lungs elsewhere are clear. There is mild cardiomegaly with pulmonary venous hypertension. There is atherosclerotic calcification in the aorta. No adenopathy. IMPRESSION: Tube and catheter positions as described. No evident pneumothorax. Evidence of pulmonary vascular congestion without frank edema or consolidation. There is aortic atherosclerosis. Electronically Signed   By: Bretta BangWilliam  Woodruff III M.D.   On: 02/15/2016 07:36   Dg Chest Port 1 View  Result Date: 02/14/2016 CLINICAL DATA:  Chest tube. EXAM: PORTABLE CHEST 1 VIEW COMPARISON:  Of 11/05/2015 . FINDINGS: Interim extubation and removal of NG tube. Right subclavian line, Swan-Ganz catheter, left chest tube in stable position. Prior CABG. Stable cardiomegaly. Mild basilar interstitial prominence suggesting mild congestive heart failure. No prominent pleural effusion or pneumothorax.  IMPRESSION: 1. Interim extubation removal of NG tube . Remaining lines and tubes including left chest tube in stable position. No pneumothorax. 2. Prior CABG. Cardiomegaly with mild basilar interstitial prominence suggesting mild congestive heart failure . Electronically Signed   By: Maisie Fushomas  Register   On: 02/14/2016 07:52   Dg Chest Port 1 View  Result Date: 02/13/2016 CLINICAL DATA:  70 year old male with a history of coronary artery bypass grafting 02/13/2016. CABG x3 with right saphenous vein graft. EXAM: PORTABLE CHEST 1 VIEW COMPARISON:  02/13/2016, 02/09/2016 FINDINGS: Cardiomediastinal silhouette unchanged from the comparison. Calcifications of the aortic arch. Surgical changes of median sternotomy and CABG. Unchanged position of the endotracheal tube, which terminates 4.6 cm above the carina. Right IJ sheath transmits a Swan-Ganz catheter, which appears to terminate in the proximal right pulmonary artery. Unchanged position of right subclavian central venous catheter which appears to terminate in the superior vena cava. Large bore left thoracostomy tube. Gastric tube projects over the mediastinum and terminates out of the field of view. Mediastinal drains/epicardial pacing leads project over the low mediastinum. Partial obscuration of the left hemidiaphragm. Coarsened interstitial markings with low lung volumes. IMPRESSION: Early postoperative changes of median sternotomy and CABG, with low lung volumes and left basilar opacity, potentially representing a combination of atelectasis and small pleural effusion. Unchanged support apparatus, as above. Aortic atherosclerosis. Signed, Yvone NeuJaime S. Loreta AveWagner, DO [facility] Electronically Signed   By: Gilmer MorJaime  Wagner D.O.   On: 02/13/2016 15:25   Dg Chest Portable 1 View  Result Date: 02/13/2016 CLINICAL DATA:  CABG. EXAM: PORTABLE CHEST 1 VIEW COMPARISON:  02/09/2016 . FINDINGS: Endotracheal tube tip 2.6  cm above the carina. Right subclavian central line tip noted over the superior vena cava. Swan-Ganz catheter tip noted in the pulmonary outflow tract. Left chest tube noted with tip over the left mid chest. Mediastinum drainage catheter noted over the mid chest. No pneumothorax. Prior CABG. Cardiomegaly. Mild bilateral interstitial prominence and small bilateral pleural effusions. Mild congestive heart failure cannot be excluded .  IMPRESSION: 1. Lines and tubes in good anatomic position. 2. Prior CABG. Cardiomegaly. Mild bilateral interstitial prominence. Mild bilateral pleural effusions. A scratched mild component congestive heart failure cannot be excluded. Electronically Signed   By: Maisie Fus  Register   On: 02/13/2016 13:37    ASSESSMENT AND PLAN  1. CAD - doing well, s/p CABG. Continue current meds 2. Volume overload - is improved and weight stable. Still some peripheral edema. No sob. 3. PAF - his rate is controlled.   Sharlot Gowda Eyva Califano,M.D.  02/17/2016 8:52 AMPatient ID: James Clayton, male   DOB: 29-Oct-1945, 70 y.o.   MRN: [id number]

## 2016-02-17 NOTE — Progress Notes (Signed)
4 Days Post-Op Procedure(s) (LRB): CORONARY ARTERY BYPASS GRAFTING (CABG)x 3 with endoscopic harvesting of right saphenous  vein LIMA to LAD SVG to Ramus SVG to PDA (N/A) TRANSESOPHAGEAL ECHOCARDIOGRAM (TEE) (N/A) Subjective: Some nausea and vomiting earlier Denies pain  Objective: Vital signs in last 24 hours: Temp:  [97.6 F (36.4 C)-98.4 F (36.9 C)] 97.9 F (36.6 C) (12/02 0742) Pulse Rate:  [43-135] 68 (12/02 0800) Cardiac Rhythm: Sinus bradycardia (12/02 0100) Resp:  [15-33] 21 (12/02 0800) BP: (92-148)/(54-100) 120/73 (12/02 0800) SpO2:  [92 %-98 %] 92 % (12/02 0800) Weight:  [257 lb 11.5 oz (116.9 kg)] 257 lb 11.5 oz (116.9 kg) (12/02 0600)  Hemodynamic parameters for last 24 hours:    Intake/Output from previous day: 12/01 0701 - 12/02 0700 In: 1746.5 [P.O.:960; I.V.:686.5] Out: 1330 [Urine:1330] Intake/Output this shift: No intake/output data recorded.  General appearance: alert, cooperative and no distress Neurologic: intact Heart: regular rate and rhythm Lungs: diminished breath sounds bibasilar Abdomen: markedly distended and tympanitic, nontender  Lab Results:  Recent Labs  02/16/16 0416 02/16/16 1858 02/17/16 0352  WBC 15.3*  --  12.7*  HGB 10.4* 11.9* 9.7*  HCT 31.9* 35.0* 29.8*  PLT 153  --  163   BMET:  Recent Labs  02/16/16 0416 02/16/16 1858 02/17/16 0352  NA 138 140 139  K 3.4* 3.6 3.8  CL 106 104 107  CO2 24  --  24  GLUCOSE 128* 156* 140*  BUN 27* 30* 34*  CREATININE 1.85* 1.80* 2.01*  CALCIUM 8.3*  --  7.9*    PT/INR: No results for input(s): LABPROT, INR in the last 72 hours. ABG    Component Value Date/Time   PHART 7.365 02/13/2016 2248   HCO3 20.1 02/13/2016 2248   TCO2 22 02/16/2016 1858   ACIDBASEDEF 5.0 (H) 02/13/2016 2248   O2SAT 61.3 02/16/2016 0430   CBG (last 3)   Recent Labs  02/16/16 2320 02/17/16 0343 02/17/16 0745  GLUCAP 121* 136* 136*    Assessment/Plan: S/P Procedure(s) (LRB): CORONARY ARTERY  BYPASS GRAFTING (CABG)x 3 with endoscopic harvesting of right saphenous  vein LIMA to LAD SVG to Ramus SVG to PDA (N/A) TRANSESOPHAGEAL ECHOCARDIOGRAM (TEE) (N/A) -CV- in SR continue metoprolol and amiodarone, dc diltiazem due to ileus  will check co-ox   RESP- Bibasilar atelectasis secondary to ileus  RENAL- UO low and creatinine up to 2 c/w acute renal failure  Likely also secondary to ileus/ 3rd spacing  Will start low dose dopamine, give IVF, keep Foley  GI- marked abdominal distention without pain or tenderness- likely adynamic ileus  Check KUB to r/o obstruction although symptoms and exam suggest not  Stop diltiazem, start reglan x 24 hours  NPO except ice chips  ENDO- CBG well controlled, stop levemir  DVT prophylaxis- continue SCD + low dose enoxaparin   LOS: 5 days    James Clayton 02/17/2016

## 2016-02-17 NOTE — Plan of Care (Signed)
Problem: Bowel/Gastric: Goal: Gastrointestinal status for postoperative course will improve Outcome: Completed/Met Date Met: 02/17/16 Pt with BM x 5 after course of reglan. PO intake improving.

## 2016-02-18 ENCOUNTER — Inpatient Hospital Stay (HOSPITAL_COMMUNITY): Payer: Medicare Other

## 2016-02-18 LAB — GLUCOSE, CAPILLARY
Glucose-Capillary: 113 mg/dL — ABNORMAL HIGH (ref 65–99)
Glucose-Capillary: 105 mg/dL — ABNORMAL HIGH (ref 65–99)
Glucose-Capillary: 129 mg/dL — ABNORMAL HIGH (ref 65–99)
Glucose-Capillary: 138 mg/dL — ABNORMAL HIGH (ref 65–99)
Glucose-Capillary: 145 mg/dL — ABNORMAL HIGH (ref 65–99)

## 2016-02-18 LAB — BASIC METABOLIC PANEL
Anion gap: 11 (ref 5–15)
BUN: 31 mg/dL — AB (ref 6–20)
CALCIUM: 8.1 mg/dL — AB (ref 8.9–10.3)
CO2: 22 mmol/L (ref 22–32)
CREATININE: 1.69 mg/dL — AB (ref 0.61–1.24)
Chloride: 108 mmol/L (ref 101–111)
GFR calc Af Amer: 46 mL/min — ABNORMAL LOW (ref >60)
GFR calc non Af Amer: 39 mL/min — ABNORMAL LOW (ref >60)
GLUCOSE: 119 mg/dL — AB (ref 65–99)
Potassium: 3.9 mmol/L (ref 3.5–5.1)
Sodium: 141 mmol/L (ref 135–145)

## 2016-02-18 LAB — CBC
HEMATOCRIT: 31.8 % — AB (ref 39.0–52.0)
Hemoglobin: 10.2 g/dL — ABNORMAL LOW (ref 13.0–17.0)
MCH: 29.1 pg (ref 26.0–34.0)
MCHC: 32.1 g/dL (ref 30.0–36.0)
MCV: 90.9 fL (ref 78.0–100.0)
PLATELETS: 201 10*3/uL (ref 150–400)
RBC: 3.5 MIL/uL — ABNORMAL LOW (ref 4.22–5.81)
RDW: 14.7 % (ref 11.5–15.5)
WBC: 9 10*3/uL (ref 4.0–10.5)

## 2016-02-18 LAB — CARBOXYHEMOGLOBIN - COOX: Carboxyhemoglobin: 1.3 % (ref 0.5–1.5)

## 2016-02-18 LAB — COOXEMETRY PANEL
Carboxyhemoglobin: 1.9 % — ABNORMAL HIGH (ref 0.5–1.5)
METHEMOGLOBIN: 1 % (ref 0.0–1.5)
O2 Saturation: 77.4 %
TOTAL HEMOGLOBIN: 10.5 g/dL — AB (ref 12.0–16.0)

## 2016-02-18 MED ORDER — METOCLOPRAMIDE HCL 5 MG/ML IJ SOLN
10.0000 mg | Freq: Four times a day (QID) | INTRAMUSCULAR | Status: AC
  Administered 2016-02-18 – 2016-02-19 (×4): 10 mg via INTRAVENOUS
  Filled 2016-02-18 (×3): qty 2

## 2016-02-18 MED ORDER — CARVEDILOL 12.5 MG PO TABS
ORAL_TABLET | ORAL | Status: AC
  Filled 2016-02-18: qty 1

## 2016-02-18 MED ORDER — METOCLOPRAMIDE HCL 5 MG/ML IJ SOLN
INTRAMUSCULAR | Status: AC
  Filled 2016-02-18: qty 2

## 2016-02-18 MED ORDER — CARVEDILOL 12.5 MG PO TABS
12.5000 mg | ORAL_TABLET | Freq: Every day | ORAL | Status: DC
  Administered 2016-02-18: 12.5 mg via ORAL

## 2016-02-18 NOTE — Progress Notes (Signed)
      [address]       [place] [id number]             [phone]      Tolerating clears without nausea  BP (!) 163/80   Pulse 88   Temp 97.7 F (36.5 C) (Oral)   Resp (!) 23   Ht 6\' 1"  (1.854 m)   Wt 257 lb 11.5 oz (116.9 kg)   SpO2 97%   BMI 34.00 kg/m   Intake/Output Summary (Last 24 hours) at 02/18/16 1820 Last data filed at 02/18/16 1700  Gross per 24 hour  Intake           1882.2 ml  Output             1998 ml  Net           -115.8 ml   + flatus, no BM yet  Slowly improving  Viviann SpareSteven C. Dorris FetchHendrickson, MD [facility] [phone]

## 2016-02-18 NOTE — Progress Notes (Signed)
Pt noted to be having minimal urine output for 2-3 hrs. Previous difficulty with foley draining noted. Pt with c/o of fullness in bladder area. Bladder scan performed that revealed >647 cc content. Dr Dorris FetchHendrickson contacted r/t scan finding and order received to replace current foley catheter. Upon removal of foley by Foley RN, a large solidified clot was noted at the tip. A new 16 fr foley was replaced by the Foley RN with immediate return of tea colored urine. Foley care provided by Foley RN and Pt gown and linen changed. Re-scanning of bladder revealed 33 cc with an output of approximately 675 cc in the urometer. Pt stated a feeling of relief. Will continue to monitor and observe.

## 2016-02-18 NOTE — Progress Notes (Signed)
5 Days Post-Op Procedure(s) (LRB): CORONARY ARTERY BYPASS GRAFTING (CABG)x 3 with endoscopic harvesting of right saphenous  vein LIMA to LAD SVG to Ramus SVG to PDA (N/A) TRANSESOPHAGEAL ECHOCARDIOGRAM (TEE) (N/A) Subjective: Feels better this AM + flatus, no BM  Objective: Vital signs in last 24 hours: Temp:  [97.5 F (36.4 C)-98.6 F (37 C)] 97.9 F (36.6 C) (12/03 0728) Pulse Rate:  [77-94] 91 (12/03 0600) Cardiac Rhythm: Normal sinus rhythm (12/02 2000) Resp:  [16-31] 24 (12/03 0600) BP: (131-190)/(62-85) 147/72 (12/03 0600) SpO2:  [88 %-96 %] 96 % (12/03 0600)  Hemodynamic parameters for last 24 hours:    Intake/Output from previous day: 12/02 0701 - 12/03 0700 In: 2458.6 [P.O.:120; I.V.:2338.6] Out: 1260 [Urine:1260] Intake/Output this shift: No intake/output data recorded.  General appearance: alert, cooperative and no distress Neurologic: intact Heart: regular rate and rhythm Lungs: diminished breath sounds bibasilar Abdomen: distended, tympanitic, nontender, + BS  Lab Results:  Recent Labs  02/17/16 0352 02/18/16 0330  WBC 12.7* 9.0  HGB 9.7* 10.2*  HCT 29.8* 31.8*  PLT 163 201   BMET:  Recent Labs  02/17/16 1700 02/18/16 0330  NA 139 141  K 3.9 3.9  CL 107 108  CO2 24 22  GLUCOSE 115* 119*  BUN 34* 31*  CREATININE 1.76* 1.69*  CALCIUM 8.1* 8.1*    PT/INR: No results for input(s): LABPROT, INR in the last 72 hours. ABG    Component Value Date/Time   PHART 7.365 02/13/2016 2248   HCO3 20.1 02/13/2016 2248   TCO2 22 02/16/2016 1858   ACIDBASEDEF 5.0 (H) 02/13/2016 2248   O2SAT 61.3 02/16/2016 0430   CBG (last 3)   Recent Labs  02/17/16 2317 02/18/16 0321 02/18/16 0724  GLUCAP 94 113* 105*    Assessment/Plan: S/P Procedure(s) (LRB): CORONARY ARTERY BYPASS GRAFTING (CABG)x 3 with endoscopic harvesting of right saphenous  vein LIMA to LAD SVG to Ramus SVG to PDA (N/A) TRANSESOPHAGEAL ECHOCARDIOGRAM (TEE) (N/A) -CV- maintaining SR  off diltiazem  BP elevated  Dc metoprolol, restart coreg  RESP- atelectasis mostly secondary to abdominal distention  RENAL- creatinine improved with hydration and dopamine  Continue dopamine today  Decrease IVF  Foley occluded last night- feels better since changed out. Keep Foley in place today  GI- ileus- passing flatus. Still distended but no pain or vomiting. I suspect ileus beginning to resolve  Clear liquids. Give 4 more doses of reglan  ENDO- CBG well controlled  Ambulate   LOS: 6 days    Loreli SlotSteven C Caryle Helgeson 02/18/2016

## 2016-02-19 ENCOUNTER — Inpatient Hospital Stay (HOSPITAL_COMMUNITY): Payer: Medicare Other

## 2016-02-19 LAB — CBC
HCT: 31.1 % — ABNORMAL LOW (ref 39.0–52.0)
Hemoglobin: 10 g/dL — ABNORMAL LOW (ref 13.0–17.0)
MCH: 29.2 pg (ref 26.0–34.0)
MCHC: 32.2 g/dL (ref 30.0–36.0)
MCV: 90.7 fL (ref 78.0–100.0)
PLATELETS: 196 10*3/uL (ref 150–400)
RBC: 3.43 MIL/uL — ABNORMAL LOW (ref 4.22–5.81)
RDW: 14.5 % (ref 11.5–15.5)
WBC: 8.2 10*3/uL (ref 4.0–10.5)

## 2016-02-19 LAB — GLUCOSE, CAPILLARY
GLUCOSE-CAPILLARY: 101 mg/dL — AB (ref 65–99)
GLUCOSE-CAPILLARY: 106 mg/dL — AB (ref 65–99)
GLUCOSE-CAPILLARY: 146 mg/dL — AB (ref 65–99)
Glucose-Capillary: 109 mg/dL — ABNORMAL HIGH (ref 65–99)
Glucose-Capillary: 116 mg/dL — ABNORMAL HIGH (ref 65–99)
Glucose-Capillary: 114 mg/dL — ABNORMAL HIGH (ref 65–99)
Glucose-Capillary: 142 mg/dL — ABNORMAL HIGH (ref 65–99)

## 2016-02-19 LAB — POCT I-STAT, CHEM 8
BUN: 23 mg/dL — ABNORMAL HIGH (ref 6–20)
Calcium, Ion: 1.18 mmol/L (ref 1.15–1.40)
Chloride: 107 mmol/L (ref 101–111)
Creatinine, Ser: 1.3 mg/dL — ABNORMAL HIGH (ref 0.61–1.24)
Glucose, Bld: 123 mg/dL — ABNORMAL HIGH (ref 65–99)
HEMATOCRIT: 36 % — AB (ref 39.0–52.0)
HEMOGLOBIN: 12.2 g/dL — AB (ref 13.0–17.0)
POTASSIUM: 4.2 mmol/L (ref 3.5–5.1)
SODIUM: 142 mmol/L (ref 135–145)
TCO2: 22 mmol/L (ref 0–100)

## 2016-02-19 LAB — BASIC METABOLIC PANEL
Anion gap: 5 (ref 5–15)
BUN: 24 mg/dL — ABNORMAL HIGH (ref 6–20)
CALCIUM: 7.7 mg/dL — AB (ref 8.9–10.3)
CO2: 25 mmol/L (ref 22–32)
CREATININE: 1.46 mg/dL — AB (ref 0.61–1.24)
Chloride: 108 mmol/L (ref 101–111)
GFR calc Af Amer: 54 mL/min — ABNORMAL LOW (ref >60)
GFR calc non Af Amer: 47 mL/min — ABNORMAL LOW (ref >60)
GLUCOSE: 119 mg/dL — AB (ref 65–99)
Potassium: 3.6 mmol/L (ref 3.5–5.1)
Sodium: 138 mmol/L (ref 135–145)

## 2016-02-19 LAB — MAGNESIUM: Magnesium: 2.2 mg/dL (ref 1.7–2.4)

## 2016-02-19 LAB — PHOSPHORUS: Phosphorus: 2.7 mg/dL (ref 2.5–4.6)

## 2016-02-19 MED ORDER — SODIUM CHLORIDE 0.9 % IV SOLN
30.0000 meq | Freq: Once | INTRAVENOUS | Status: AC
  Administered 2016-02-19: 30 meq via INTRAVENOUS
  Filled 2016-02-19: qty 15

## 2016-02-19 MED ORDER — DEXTROSE 5 % IV SOLN
1.0000 g | Freq: Two times a day (BID) | INTRAVENOUS | Status: DC
  Administered 2016-02-19 (×2): 1 g via INTRAVENOUS
  Filled 2016-02-19 (×5): qty 1

## 2016-02-19 MED ORDER — SODIUM CHLORIDE 0.9% FLUSH
10.0000 mL | INTRAVENOUS | Status: DC | PRN
  Administered 2016-02-23 – 2016-02-25 (×4): 10 mL
  Filled 2016-02-19 (×4): qty 40

## 2016-02-19 MED ORDER — CARVEDILOL 12.5 MG PO TABS
12.5000 mg | ORAL_TABLET | Freq: Every day | ORAL | Status: DC
  Administered 2016-02-19: 12.5 mg via ORAL
  Filled 2016-02-19: qty 1

## 2016-02-19 MED ORDER — OXYCODONE HCL 5 MG PO TABS: 5.0000 mg | ORAL_TABLET | ORAL | Status: DC | PRN

## 2016-02-19 MED ORDER — TRAVASOL 10 % IV SOLN: INTRAVENOUS | Status: DC

## 2016-02-19 MED ORDER — SODIUM CHLORIDE 0.9 % IV SOLN: INTRAVENOUS | Status: DC

## 2016-02-19 MED ORDER — METOCLOPRAMIDE HCL 5 MG/ML IJ SOLN
10.0000 mg | Freq: Four times a day (QID) | INTRAMUSCULAR | Status: AC
  Administered 2016-02-19 – 2016-02-20 (×8): 10 mg via INTRAVENOUS
  Filled 2016-02-19 (×8): qty 2

## 2016-02-19 MED ORDER — METOPROLOL TARTRATE 12.5 MG HALF TABLET: 12.5000 mg | ORAL_TABLET | Freq: Two times a day (BID) | ORAL | Status: DC

## 2016-02-19 MED ORDER — SODIUM CHLORIDE 0.9% FLUSH
10.0000 mL | Freq: Two times a day (BID) | INTRAVENOUS | Status: DC
Start: 2016-02-19 — End: 2016-02-26
  Administered 2016-02-19 – 2016-02-26 (×8): 10 mL

## 2016-02-19 MED ORDER — TRACE MINERALS CR-CU-MN-SE-ZN 10-1000-500-60 MCG/ML IV SOLN
INTRAVENOUS | Status: AC
  Administered 2016-02-19: 18:00:00 via INTRAVENOUS
  Filled 2016-02-19: qty 960

## 2016-02-19 MED ORDER — LOSARTAN POTASSIUM 50 MG PO TABS
50.0000 mg | ORAL_TABLET | Freq: Every day | ORAL | Status: DC
  Administered 2016-02-19 – 2016-02-22 (×4): 50 mg via ORAL
  Filled 2016-02-19 (×4): qty 1

## 2016-02-19 NOTE — Progress Notes (Signed)
6 Days Post-Op Procedure(s) (LRB): CORONARY ARTERY BYPASS GRAFTING (CABG)x 3 with endoscopic harvesting of right saphenous  vein LIMA to LAD SVG to Ramus SVG to PDA (N/A) TRANSESOPHAGEAL ECHOCARDIOGRAM (TEE) (N/A) Subjective: Colon > 10 cm diameter, high pitched BS Sternal drainage nsr Scrotal edema  Will start TPN for worsening ileus and start  Fortaz  Objective: Vital signs in last 24 hours: Temp:  [97.6 F (36.4 C)-98.2 F (36.8 C)] 98.2 F (36.8 C) (12/04 0400) Pulse Rate:  [79-94] 94 (12/04 0800) Cardiac Rhythm: Normal sinus rhythm (12/04 0754) Resp:  [20-31] 22 (12/04 0800) BP: (129-172)/(69-108) 169/88 (12/04 0800) SpO2:  [95 %-99 %] 98 % (12/04 0800) Weight:  [260 lb 8 oz (118.2 kg)] 260 lb 8 oz (118.2 kg) (12/04 0500)  Hemodynamic parameters for last 24 hours:  nsr  Intake/Output from previous day: 12/03 0701 - 12/04 0700 In: 2008.8 [I.V.:1743.8; IV Piggyback:265] Out: 2003 [Urine:2000; Stool:3] Intake/Output this shift: Total I/O In: 162.9 [I.V.:162.9] Out: 100 [Urine:100]  Abdomen distended . Non tender  Lab Results:  Recent Labs  02/18/16 0330 02/19/16 0332  WBC 9.0 8.2  HGB 10.2* 10.0*  HCT 31.8* 31.1*  PLT 201 196   BMET:  Recent Labs  02/18/16 0330 02/19/16 0332  NA 141 138  K 3.9 3.6  CL 108 108  CO2 22 25  GLUCOSE 119* 119*  BUN 31* 24*  CREATININE 1.69* 1.46*  CALCIUM 8.1* 7.7*    PT/INR: No results for input(s): LABPROT, INR in the last 72 hours. ABG    Component Value Date/Time   PHART 7.365 02/13/2016 2248   HCO3 20.1 02/13/2016 2248   TCO2 22 02/16/2016 1858   ACIDBASEDEF 5.0 (H) 02/13/2016 2248   O2SAT 77.4 02/18/2016 0930   CBG (last 3)   Recent Labs  02/18/16 2028 02/19/16 0001 02/19/16 0326  GLUCAP 145* 101* 109*    Assessment/Plan: S/P Procedure(s) (LRB): CORONARY ARTERY BYPASS GRAFTING (CABG)x 3 with endoscopic harvesting of right saphenous  vein LIMA to LAD SVG to Ramus SVG to PDA (N/A) TRANSESOPHAGEAL  ECHOCARDIOGRAM (TEE) (N/A) Keep in ICU for worsening colonic ileus   LOS: 7 days    James Clayton 02/19/2016

## 2016-02-19 NOTE — Progress Notes (Signed)
Initial Nutrition Assessment  DOCUMENTATION CODES:   Obesity unspecified  INTERVENTION:    TPN per pharmacy  NUTRITION DIAGNOSIS:   Inadequate oral intake related to altered GI function as evidenced by NPO status  GOAL:   Patient will meet greater than or equal to 90% of their needs  MONITOR:   Diet advancement, Labs, Weight trends, Skin, I & O's, TPN prescription  REASON FOR ASSESSMENT:   Consult New TPN/TNA  ASSESSMENT:   70 y.o. Male with h/o newly diagnosed severe multi-vessel CAD, chornic diastolic CHF, hypertensive heart disease, diabetes mellitus with complications, CKD stage III, and HLD who [facility] pain, found to have a NSTEMI with cardiac cath on 02/12/16 showing severe multi-vessel CAD.  Patient s/p procedures 12/1: CORONARY BYPASS GRAFTING x 3  Pt was on a Carbohydrate Modified diet 11/29 to 12/1. Has been experiencing nausea and vomiting. + ileus with high pitched bowel sounds. Pharmacy consulted for TPN. CBG's [id number].  Patient to receive TPN with Clinimix E 5/20 @ 40 ml/hr.  Lipids (20% IVFE) being held at this time.  Provides 844 kcal and 48 grams protein per day. Meets 36% minimum estimated energy needs and 42% minimum estimated protein needs.  Diet Order:  Diet NPO time specified Except for: Sips with Meds .TPN (CLINIMIX-E) Adult  Skin:  Reviewed, no issues  Last BM:  12/4  Height:   Ht Readings from Last 1 Encounters:  02/16/16 6\' 1"  (1.854 m)    Weight:   Wt Readings from Last 1 Encounters:  02/19/16 260 lb 8 oz (118.2 kg)    Ideal Body Weight:  83.6 kg  BMI:  Body mass index is 34.37 kg/m.  Estimated Nutritional Needs:   Kcal:  2200-2400  Protein:  115-125 gm  Fluid:  per MD  EDUCATION NEEDS:   No education needs identified at this time  Maureen ChattersKatie Macen Joslin, RD, LDN Pager #: [phone] After-Hours Pager #: [phone]

## 2016-02-19 NOTE — Care Management Important Message (Signed)
Important Message  Patient Details  Name: James ChimesJames Mainer MRN: [id number] Date of Birth: 08-15-1945   Medicare Important Message Given:  Yes    Sparrow Sanzo Abena 02/19/2016, 11:09 AM

## 2016-02-19 NOTE — Progress Notes (Signed)
PHARMACY - ADULT TOTAL PARENTERAL NUTRITION CONSULT NOTE   Pharmacy Consult for TPN Indication: Worsening colonic ileus  Patient Measurements: Height: 6\' 1"  (185.4 cm) Weight: 260 lb 8 oz (118.2 kg) IBW/kg (Calculated) : 79.9 TPN AdjBW (KG): 89.2 Body mass index is 34.37 kg/m.  Assessment: 70 year old obese male now six days post-op CABG x3 who was receiving carb modified diet from 11/29 until 12/1 with fair to poor intake, then transitioned to NPO on 12/2 due to nausea and vomiting. On 12/3 patient had trial of clear liquid diet with fair intake, however now transitioning 12/4 to TPN for worsening ileus.   GI: Abdomen xray 12/4 showing progressively worsening ileus compared to 12/3 xray, to start TPN per primary. Medium amount of loose stool recorded- on docusate and started metoclopramide q6h. Abdomen is distended, non-tender per RN charted exam.  Endo: DM -CBG 109-145 on TCTS SSI q4h.  Insulin requirements in the past 24 hours:  Lytes: Na/Cl wnl, K 3.6 (down), receiving multi-Krun x3. Corrected Calcium ~8.5. Mag on 12/1 was wnl. No Phosphorus level available.  Renal: SCr trending down 1.46, UOP 0.7 cc/kg/hr. IVF- NS @ 75 mL/hr. Net + 2.8L this admission.  Pulm: RA Cards: HTN, s/p CABG 11/28- BP elevated on Dopamine at 2.5 (weaning), NSR. Amiodarone, Carvedilol, Atorvastatin. Hepatobil: Labs 11/27 - AST up 64, ALT and AlkPhos wnl. Tbili up 1.3, TG 273>>233 (last on 11/27).  Neuro: GCS 15, RASS 0, pain 2 to 5 out of 10. Prn oxycodone. ID: James QuickFortaz per MD for sternal drainage and possible infection. D#1. Afebrile, wbc wnl. No cultures currently.   Best Practices: Lovenox 30, Protonix po TPN Access: PICC placement 12/4 TPN start date: 12/4 >>  Nutritional Goals (per RD recommendation on ): kCal: Protein:   Current Nutrition:  NPO with sips and meds  Plan:  Initiate Clinimix E 5/20 at 40 ml/hr at 1800 PM Hold 20% lipid emulsion for first 7 days for ICU patients per ASPEN guidelines  (Start date Monday 02/26/16) IVF to discontinue at 1800 per primary team.  Follow-up RD recommendations for nutritional goals Add MVI and trace elements in TPN Continue TCTS q4h SSI and adjust as needed Monitor TPN labs Follow-up Magnesium and Phosphorus level this PM  Watch TG level  *Low dose Dopamine for urine output is not supported by literature - please consider discontinuation as more risk than benefit.   James Clayton, PharmD, BCPS Clinical Pharmacist [phone] until 3:30 PM today [phone] after hours 02/19/2016,9:20 AM

## 2016-02-19 NOTE — Progress Notes (Signed)
Patient ID: James Clayton, male   DOB: 15-Feb-1946, 70 y.o.   MRN: [id number] EVENING ROUNDS NOTE :     [address]       [place] [id number]             [phone]                 6 Days Post-Op Procedure(s) (LRB): CORONARY ARTERY BYPASS GRAFTING (CABG)x 3 with endoscopic harvesting of right saphenous  vein LIMA to LAD SVG to Ramus SVG to PDA (N/A) TRANSESOPHAGEAL ECHOCARDIOGRAM (TEE) (N/A)  Total Length of Stay:  LOS: 7 days  BP (!) 166/80   Pulse 83   Temp 98.7 F (37.1 C) (Oral)   Resp (!) 25   Ht 6\' 1"  (1.854 m)   Wt 260 lb 8 oz (118.2 kg)   SpO2 97%   BMI 34.37 kg/m   .Intake/Output      12/04 0701 - 12/05 0700   I.V. (mL/kg) 1273 (10.8)   IV Piggyback 100   Total Intake(mL/kg) 1373 (11.6)   Urine (mL/kg/hr) 1261 (0.7)   Stool 3 (0)   Total Output 1264   Net +109         . Marland Kitchen.TPN (CLINIMIX-E) Adult 40 mL/hr at 02/19/16 2100  . sodium chloride Stopped (02/14/16 0900)  . sodium chloride Stopped (02/19/16 1013)  . sodium chloride Stopped (02/19/16 1012)  . sodium chloride 20 mL/hr at 02/19/16 2100  . DOPamine Stopped (02/19/16 1945)  . lactated ringers Stopped (02/13/16 1900)  . lactated ringers Stopped (02/19/16 1013)     Lab Results  Component Value Date   WBC 8.2 02/19/2016   HGB 12.2 (L) 02/19/2016   HCT 36.0 (L) 02/19/2016   PLT 196 02/19/2016   GLUCOSE 123 (H) 02/19/2016   CHOL 105 02/13/2016   TRIG 233 (H) 02/13/2016   HDL 21 (L) 02/13/2016   LDLCALC 37 02/13/2016   ALT 41 02/12/2016   AST 64 (H) 02/12/2016   NA 142 02/19/2016   K 4.2 02/19/2016   CL 107 02/19/2016   CREATININE 1.30 (H) 02/19/2016   BUN 23 (H) 02/19/2016   CO2 25 02/19/2016   TSH 1.653 02/13/2016   INR 1.33 02/13/2016   HGBA1C 6.9 (H) 02/13/2016   ileus improving, on TNA Cr 1.3    James OvensEdward B Analyn Matusek MD  Beeper [phone] Office [phone] 02/19/2016 9:41 PM

## 2016-02-19 NOTE — Progress Notes (Signed)
Peripherally Inserted Central Catheter/Midline Placement  The IV Nurse has discussed with the patient and/or persons authorized to consent for the patient, the purpose of this procedure and the potential benefits and risks involved with this procedure.  The benefits include less needle sticks, lab draws from the catheter, and the patient may be discharged home with the catheter. Risks include, but not limited to, infection, bleeding, blood clot (thrombus formation), and puncture of an artery; nerve damage and irregular heartbeat and possibility to perform a PICC exchange if needed/ordered by physician.  Alternatives to this procedure were also discussed.  Bard Power PICC patient education guide, fact sheet on infection prevention and patient information card has been provided to patient /or left at bedside.    PICC/Midline Placement Documentation        Timmothy Soursewman, Jazell Rosenau Renee 02/19/2016, 10:53 AM

## 2016-02-20 ENCOUNTER — Inpatient Hospital Stay (HOSPITAL_COMMUNITY): Payer: Medicare Other

## 2016-02-20 ENCOUNTER — Encounter (HOSPITAL_COMMUNITY): Payer: Self-pay | Admitting: Cardiothoracic Surgery

## 2016-02-20 LAB — GLUCOSE, CAPILLARY
GLUCOSE-CAPILLARY: 146 mg/dL — AB (ref 65–99)
GLUCOSE-CAPILLARY: 154 mg/dL — AB (ref 65–99)
Glucose-Capillary: 136 mg/dL — ABNORMAL HIGH (ref 65–99)
Glucose-Capillary: 146 mg/dL — ABNORMAL HIGH (ref 65–99)
Glucose-Capillary: 147 mg/dL — ABNORMAL HIGH (ref 65–99)
Glucose-Capillary: 172 mg/dL — ABNORMAL HIGH (ref 65–99)

## 2016-02-20 LAB — CBC
HCT: 31.2 % — ABNORMAL LOW (ref 39.0–52.0)
Hemoglobin: 10 g/dL — ABNORMAL LOW (ref 13.0–17.0)
MCH: 28.8 pg (ref 26.0–34.0)
MCHC: 32.1 g/dL (ref 30.0–36.0)
MCV: 89.9 fL (ref 78.0–100.0)
Platelets: 214 10*3/uL (ref 150–400)
RBC: 3.47 MIL/uL — ABNORMAL LOW (ref 4.22–5.81)
RDW: 14.5 % (ref 11.5–15.5)
WBC: 10.4 10*3/uL (ref 4.0–10.5)

## 2016-02-20 LAB — COMPREHENSIVE METABOLIC PANEL
ALT: 26 U/L (ref 17–63)
AST: 18 U/L (ref 15–41)
Albumin: 2.2 g/dL — ABNORMAL LOW (ref 3.5–5.0)
Alkaline Phosphatase: 80 U/L (ref 38–126)
Anion gap: 8 (ref 5–15)
BUN: 21 mg/dL — ABNORMAL HIGH (ref 6–20)
CO2: 24 mmol/L (ref 22–32)
Calcium: 8 mg/dL — ABNORMAL LOW (ref 8.9–10.3)
Chloride: 107 mmol/L (ref 101–111)
Creatinine, Ser: 1.4 mg/dL — ABNORMAL HIGH (ref 0.61–1.24)
GFR calc Af Amer: 57 mL/min — ABNORMAL LOW (ref >60)
GFR calc non Af Amer: 49 mL/min — ABNORMAL LOW (ref >60)
Glucose, Bld: 147 mg/dL — ABNORMAL HIGH (ref 65–99)
Potassium: 3.6 mmol/L (ref 3.5–5.1)
Sodium: 139 mmol/L (ref 135–145)
Total Bilirubin: 0.8 mg/dL (ref 0.3–1.2)
Total Protein: 4.6 g/dL — ABNORMAL LOW (ref 6.5–8.1)

## 2016-02-20 LAB — TRIGLYCERIDES: Triglycerides: 173 mg/dL — ABNORMAL HIGH (ref <150)

## 2016-02-20 LAB — PHOSPHORUS: Phosphorus: 2.9 mg/dL (ref 2.5–4.6)

## 2016-02-20 LAB — DIFFERENTIAL
Basophils Absolute: 0 10*3/uL (ref 0.0–0.1)
Basophils Relative: 0 %
Eosinophils Absolute: 0.2 10*3/uL (ref 0.0–0.7)
Eosinophils Relative: 2 %
Lymphocytes Relative: 14 %
Lymphs Abs: 1.4 10*3/uL (ref 0.7–4.0)
Monocytes Absolute: 0.7 10*3/uL (ref 0.1–1.0)
Monocytes Relative: 7 %
Neutro Abs: 8.1 10*3/uL — ABNORMAL HIGH (ref 1.7–7.7)
Neutrophils Relative %: 77 %

## 2016-02-20 LAB — MAGNESIUM: Magnesium: 2.3 mg/dL (ref 1.7–2.4)

## 2016-02-20 LAB — PREALBUMIN: Prealbumin: 14.9 mg/dL — ABNORMAL LOW (ref 18–38)

## 2016-02-20 MED ORDER — FAT EMULSION 20 % IV EMUL
240.0000 mL | INTRAVENOUS | Status: AC
  Administered 2016-02-20: 240 mL via INTRAVENOUS
  Filled 2016-02-20: qty 250

## 2016-02-20 MED ORDER — DEXTROSE 5 % IV SOLN
1.0000 g | Freq: Three times a day (TID) | INTRAVENOUS | Status: DC
  Administered 2016-02-20 – 2016-02-24 (×13): 1 g via INTRAVENOUS
  Filled 2016-02-20 (×16): qty 1

## 2016-02-20 MED ORDER — CHLORHEXIDINE GLUCONATE 0.12 % MT SOLN
15.0000 mL | Freq: Two times a day (BID) | OROMUCOSAL | Status: DC
  Administered 2016-02-20 – 2016-02-25 (×6): 15 mL via OROMUCOSAL
  Filled 2016-02-20 (×8): qty 15

## 2016-02-20 MED ORDER — SODIUM CHLORIDE 0.9 % IV SOLN
30.0000 meq | Freq: Once | INTRAVENOUS | Status: AC
  Administered 2016-02-20: 30 meq via INTRAVENOUS
  Filled 2016-02-20: qty 15

## 2016-02-20 MED ORDER — M.V.I. ADULT IV INJ
INJECTION | INTRAVENOUS | Status: AC
  Administered 2016-02-20: 17:00:00 via INTRAVENOUS
  Filled 2016-02-20: qty 1992

## 2016-02-20 MED ORDER — ORAL CARE MOUTH RINSE
15.0000 mL | Freq: Two times a day (BID) | OROMUCOSAL | Status: DC
  Administered 2016-02-21 (×2): 15 mL via OROMUCOSAL

## 2016-02-20 MED ORDER — ENOXAPARIN SODIUM 40 MG/0.4ML ~~LOC~~ SOLN
40.0000 mg | SUBCUTANEOUS | Status: DC
  Administered 2016-02-20 – 2016-02-21 (×2): 40 mg via SUBCUTANEOUS
  Filled 2016-02-20 (×3): qty 0.4

## 2016-02-20 MED ORDER — CARVEDILOL 12.5 MG PO TABS
12.5000 mg | ORAL_TABLET | Freq: Two times a day (BID) | ORAL | Status: DC
  Administered 2016-02-20 – 2016-02-21 (×3): 12.5 mg via ORAL
  Filled 2016-02-20 (×3): qty 1

## 2016-02-20 MED ORDER — POTASSIUM CHLORIDE CRYS ER 20 MEQ PO TBCR
20.0000 meq | EXTENDED_RELEASE_TABLET | Freq: Four times a day (QID) | ORAL | Status: AC
  Administered 2016-02-20: 20 meq via ORAL
  Filled 2016-02-20: qty 1

## 2016-02-20 MED ORDER — POTASSIUM CHLORIDE 20 MEQ/15ML (10%) PO SOLN
20.0000 meq | Freq: Four times a day (QID) | ORAL | Status: AC
  Administered 2016-02-20: 20 meq via ORAL
  Filled 2016-02-20: qty 15

## 2016-02-20 NOTE — Progress Notes (Signed)
PHARMACY - ADULT TOTAL PARENTERAL NUTRITION CONSULT NOTE   Pharmacy Consult for TPN Indication: Worsening colonic ileus  Patient Measurements: Height: 6' 1"  (185.4 cm) Weight: 256 lb 6.3 oz (116.3 kg) IBW/kg (Calculated) : 79.9 TPN AdjBW (KG): 89.2 Body mass index is 33.83 kg/m.  Assessment: 70 year old obese male now s/p CABG x3 who was receiving carb modified diet from 11/29 until 12/1 with fair to poor intake, then transitioned to NPO on 12/2 due to nausea and vomiting. On 12/3 patient had trial of clear liquid diet with fair intake, however now transitioning 12/4 to TPN for worsening ileus.   GI: Abdomen xray 12/4 showing progressively worsening ileus compared to 12/3 xray, to start TPN per primary. Medium amount of loose stool recorded- on docusate and started metoclopramide q6h. Pre-albumin 14.9. PPI Endo: DM -CBG 142-147 on TCTS SSI q4h.  Insulin requirements in the past 24 hours: 6 units  Lytes: K 3.6 (goal >/=4 w/ ileus) - s/p multi-Krun x3 (MD ordered KCl PO 78mq x2 this AM). Mag 2.3 (goal >/=2 w/ ileus). Phos up 2.9 Renal: SCr back up 1.4, UOP 0.7 cc/kg/hr. IVF- NS at 20 ml/h. Net + 2.9L this admission Pulm: RA Cards: HTN, s/p CABG 11/28. Amiodarone, asa, Atorvastatin, ARB, lopressor PRN Hepatobil: LFTs/Tbili/alk phos WNL. TG [id number]: Prn oxycodone. ID: FTressie Ellisper MD for sternal drainage and possible infection. D#2. Afebrile, wbc wnl. No cultures currently.   Best Practices: Lovenox TPN Access: PICC placement 12/4 TPN start date: 12/4 >>  Nutritional Goals (per RD recommendation on 12/4): KCal:  2200-2400 Protein:  115-125  Current Nutrition:  NPO with sips and meds TPN  Plan:  -Change Clinimix to E 5/15 and increase rate to 83 ml/hr + add lipids at 10 ml/hr as patient is not critically ill -TPN will provide 100g protein + 1894kcal in 24h period - meeting > 80% of patient's needs -Multivitamin and trace elements in TPN -Monitor CBGs on TCTS q4h SSI -KCl  multi-run 385m x1 - run over 3 hours -F/u AM labs   HaElicia LampPharmD, BCPS Clinical Pharmacist 02/20/2016 8:11 AM

## 2016-02-20 NOTE — Progress Notes (Signed)
7 Days Post-Op Procedure(s) (LRB): CORONARY ARTERY BYPASS GRAFTING (CABG)x 3 with endoscopic harvesting of right saphenous  vein LIMA to LAD SVG to Ramus SVG to PDA (N/A) TRANSESOPHAGEAL ECHOCARDIOGRAM (TEE) (N/A) Subjective: Urgent CABG Preoperative LV dysfunction, moderate heart failure Postoperative atrial fibrillation now converted to sinus rhythm Postoperative colonic ileus, improving Colonic Diameter has decreased from 11 cm >> 8.5 cm TPN day #2 Objective: Vital signs in last 24 hours: Temp:  [98 F (36.7 C)-98.7 F (37.1 C)] 98 F (36.7 C) (12/05 1500) Pulse Rate:  [76-83] 79 (12/05 0600) Cardiac Rhythm: Normal sinus rhythm (12/05 0800) Resp:  [20-37] 37 (12/05 0700) BP: (133-179)/(71-89) 158/80 (12/05 0600) SpO2:  [94 %-97 %] 97 % (12/05 0600) Weight:  [256 lb 6.3 oz (116.3 kg)] 256 lb 6.3 oz (116.3 kg) (12/05 0600)  Hemodynamic parameters for last 24 hours:  stable  Intake/Output from previous day: 12/04 0701 - 12/05 0700 In: 1973 [I.V.:1873; IV Piggyback:100] Out: 1839 [Urine:1836; Stool:3] Intake/Output this shift: No intake/output data recorded.       Exam    General- alert and comfortable   Lungs- clear without rales, wheezes   Cor- regular rate and rhythm, no murmur , gallop   Abdomen- soft, distended   Extremities - warm, non-tender, minimal edema   Neuro- oriented, appropriate, no focal weakness   Lab Results:  Recent Labs  02/19/16 0332 02/19/16 1538 02/20/16 0440  WBC 8.2  --  10.4  HGB 10.0* 12.2* 10.0*  HCT 31.1* 36.0* 31.2*  PLT 196  --  214   BMET:  Recent Labs  02/19/16 0332 02/19/16 1538 02/20/16 0440  NA 138 142 139  K 3.6 4.2 3.6  CL 108 107 107  CO2 25  --  24  GLUCOSE 119* 123* 147*  BUN 24* 23* 21*  CREATININE 1.46* 1.30* 1.40*  CALCIUM 7.7*  --  8.0*    PT/INR: No results for input(s): LABPROT, INR in the last 72 hours. ABG    Component Value Date/Time   PHART 7.365 02/13/2016 2248   HCO3 20.1 02/13/2016 2248   TCO2 22 02/19/2016 1538   ACIDBASEDEF 5.0 (H) 02/13/2016 2248   O2SAT 77.4 02/18/2016 0930   CBG (last 3)   Recent Labs  02/20/16 0910 02/20/16 1117 02/20/16 1601  GLUCAP 147* 146* 154*    Assessment/Plan: S/P Procedure(s) (LRB): CORONARY ARTERY BYPASS GRAFTING (CABG)x 3 with endoscopic harvesting of right saphenous  vein LIMA to LAD SVG to Ramus SVG to PDA (N/A) TRANSESOPHAGEAL ECHOCARDIOGRAM (TEE) (N/A) Continue current care Plan on transferring out of ICU in a.m. and starting clear liquid diet if he continues to improve   LOS: 8 days    Kathlee Nationseter Van Trigt III 02/20/2016

## 2016-02-21 ENCOUNTER — Inpatient Hospital Stay (HOSPITAL_COMMUNITY): Payer: Medicare Other

## 2016-02-21 LAB — CBC
HCT: 31.2 % — ABNORMAL LOW (ref 39.0–52.0)
Hemoglobin: 9.9 g/dL — ABNORMAL LOW (ref 13.0–17.0)
MCH: 28.9 pg (ref 26.0–34.0)
MCHC: 31.7 g/dL (ref 30.0–36.0)
MCV: 91.2 fL (ref 78.0–100.0)
Platelets: 216 10*3/uL (ref 150–400)
RBC: 3.42 MIL/uL — ABNORMAL LOW (ref 4.22–5.81)
RDW: 14.6 % (ref 11.5–15.5)
WBC: 11.2 10*3/uL — ABNORMAL HIGH (ref 4.0–10.5)

## 2016-02-21 LAB — COMPREHENSIVE METABOLIC PANEL
ALT: 31 U/L (ref 17–63)
AST: 22 U/L (ref 15–41)
Albumin: 2.2 g/dL — ABNORMAL LOW (ref 3.5–5.0)
Alkaline Phosphatase: 82 U/L (ref 38–126)
Anion gap: 6 (ref 5–15)
BUN: 20 mg/dL (ref 6–20)
CO2: 24 mmol/L (ref 22–32)
Calcium: 8 mg/dL — ABNORMAL LOW (ref 8.9–10.3)
Chloride: 109 mmol/L (ref 101–111)
Creatinine, Ser: 1.32 mg/dL — ABNORMAL HIGH (ref 0.61–1.24)
GFR calc Af Amer: >60 mL/min (ref >60)
GFR calc non Af Amer: 53 mL/min — ABNORMAL LOW (ref >60)
Glucose, Bld: 163 mg/dL — ABNORMAL HIGH (ref 65–99)
Potassium: 3.9 mmol/L (ref 3.5–5.1)
Sodium: 139 mmol/L (ref 135–145)
Total Bilirubin: 0.7 mg/dL (ref 0.3–1.2)
Total Protein: 4.8 g/dL — ABNORMAL LOW (ref 6.5–8.1)

## 2016-02-21 LAB — GLUCOSE, CAPILLARY
GLUCOSE-CAPILLARY: 167 mg/dL — AB (ref 65–99)
GLUCOSE-CAPILLARY: 135 mg/dL — AB (ref 65–99)
Glucose-Capillary: 137 mg/dL — ABNORMAL HIGH (ref 65–99)
Glucose-Capillary: 131 mg/dL — ABNORMAL HIGH (ref 65–99)
Glucose-Capillary: 146 mg/dL — ABNORMAL HIGH (ref 65–99)

## 2016-02-21 LAB — PHOSPHORUS: Phosphorus: 3.3 mg/dL (ref 2.5–4.6)

## 2016-02-21 LAB — MAGNESIUM: Magnesium: 2.2 mg/dL (ref 1.7–2.4)

## 2016-02-21 MED ORDER — FUROSEMIDE 40 MG PO TABS
40.0000 mg | ORAL_TABLET | Freq: Every day | ORAL | Status: DC
  Administered 2016-02-21 – 2016-02-24 (×4): 40 mg via ORAL
  Filled 2016-02-21 (×4): qty 1

## 2016-02-21 MED ORDER — FAT EMULSION 20 % IV EMUL
240.0000 mL | INTRAVENOUS | Status: DC
  Administered 2016-02-21: 240 mL via INTRAVENOUS
  Filled 2016-02-21: qty 250

## 2016-02-21 MED ORDER — LABETALOL HCL 5 MG/ML IV SOLN
INTRAVENOUS | Status: AC
  Administered 2016-02-21: 10 mg via INTRAVENOUS
  Filled 2016-02-21: qty 4

## 2016-02-21 MED ORDER — CARVEDILOL 25 MG PO TABS
25.0000 mg | ORAL_TABLET | Freq: Two times a day (BID) | ORAL | Status: DC
Start: 2016-02-21 — End: 2016-02-26
  Administered 2016-02-21 – 2016-02-26 (×10): 25 mg via ORAL
  Filled 2016-02-21 (×10): qty 1

## 2016-02-21 MED ORDER — METOLAZONE 5 MG PO TABS
5.0000 mg | ORAL_TABLET | Freq: Every day | ORAL | Status: AC
  Administered 2016-02-21 – 2016-02-23 (×3): 5 mg via ORAL
  Filled 2016-02-21 (×3): qty 1

## 2016-02-21 MED ORDER — LABETALOL HCL 5 MG/ML IV SOLN
10.0000 mg | INTRAVENOUS | Status: DC | PRN
  Administered 2016-02-21 – 2016-02-22 (×3): 10 mg via INTRAVENOUS
  Filled 2016-02-21: qty 4

## 2016-02-21 MED ORDER — INSULIN ASPART 100 UNIT/ML ~~LOC~~ SOLN
0.0000 [IU] | Freq: Every day | SUBCUTANEOUS | Status: DC
  Administered 2016-02-21: 0 [IU] via SUBCUTANEOUS

## 2016-02-21 MED ORDER — INSULIN ASPART 100 UNIT/ML ~~LOC~~ SOLN
0.0000 [IU] | Freq: Three times a day (TID) | SUBCUTANEOUS | Status: DC
  Administered 2016-02-21 (×3): 2 [IU] via SUBCUTANEOUS
  Administered 2016-02-22 (×2): 3 [IU] via SUBCUTANEOUS
  Administered 2016-02-23 (×2): 2 [IU] via SUBCUTANEOUS
  Administered 2016-02-24: 3 [IU] via SUBCUTANEOUS
  Administered 2016-02-24 – 2016-02-26 (×3): 2 [IU] via SUBCUTANEOUS

## 2016-02-21 MED ORDER — TRACE MINERALS CR-CU-MN-SE-ZN 10-1000-500-60 MCG/ML IV SOLN
INTRAVENOUS | Status: DC
  Administered 2016-02-21: 17:00:00 via INTRAVENOUS
  Filled 2016-02-21: qty 1992

## 2016-02-21 NOTE — Progress Notes (Signed)
8 Days Post-Op Procedure(s) (LRB): CORONARY ARTERY BYPASS GRAFTING (CABG)x 3 with endoscopic harvesting of right saphenous  vein LIMA to LAD SVG to Ramus SVG to PDA (N/A) TRANSESOPHAGEAL ECHOCARDIOGRAM (TEE) (N/A) Subjective: CABG for NSTEMI, CHF Postop colonic ileus better, start clear liqs, cont TNA Sternal drainage- cont antibiotics, dressing change Postop afib- now nsr on amiodarone HTN- resuming, titrating preop meds  Objective: Vital signs in last 24 hours: Temp:  [98 F (36.7 C)-99.1 F (37.3 C)] 98 F (36.7 C) (12/06 0000) Pulse Rate:  [67-81] 75 (12/06 0700) Cardiac Rhythm: Normal sinus rhythm (12/06 0515) Resp:  [21-37] 25 (12/06 0700) BP: (147-179)/(70-87) 167/84 (12/06 0700) SpO2:  [93 %-99 %] 99 % (12/06 0700) Weight:  [260 lb 9.3 oz (118.2 kg)] 260 lb 9.3 oz (118.2 kg) (12/06 0600)  Hemodynamic parameters for last 24 hours:  stable  Intake/Output from previous day: 12/05 0701 - 12/06 0700 In: 2057.3 [I.V.:1907.3; IV Piggyback:150] Out: 1635 [Urine:1635] Intake/Output this shift: No intake/output data recorded.       Exam    General- alert and comfortable   Lungs- clear without rales, wheezes   Cor- regular rate and rhythm, no murmur , gallop   Abdomen- soft, non-tender, less distended   Extremities - warm, non-tender, minimal edema   Neuro- oriented, appropriate, no focal weakness   Lab Results:  Recent Labs  02/20/16 0440 02/21/16 0510  WBC 10.4 11.2*  HGB 10.0* 9.9*  HCT 31.2* 31.2*  PLT 214 216   BMET:  Recent Labs  02/20/16 0440 02/21/16 0510  NA 139 139  K 3.6 3.9  CL 107 109  CO2 24 24  GLUCOSE 147* 163*  BUN 21* 20  CREATININE 1.40* 1.32*  CALCIUM 8.0* 8.0*    PT/INR: No results for input(s): LABPROT, INR in the last 72 hours. ABG    Component Value Date/Time   PHART 7.365 02/13/2016 2248   HCO3 20.1 02/13/2016 2248   TCO2 22 02/19/2016 1538   ACIDBASEDEF 5.0 (H) 02/13/2016 2248   O2SAT 77.4 02/18/2016 0930   CBG  (last 3)   Recent Labs  02/20/16 1945 02/20/16 2344 02/21/16 0512  GLUCAP 136* 172* 167*    Assessment/Plan: S/P Procedure(s) (LRB): CORONARY ARTERY BYPASS GRAFTING (CABG)x 3 with endoscopic harvesting of right saphenous  vein LIMA to LAD SVG to Ramus SVG to PDA (N/A) TRANSESOPHAGEAL ECHOCARDIOGRAM (TEE) (N/A) Mobilize Diuresis Diabetes control Plan for transfer to step-down: see transfer orders Adv to full liq tomorrow DC EPWs tomorrow  LOS: 9 days    James Clayton 02/21/2016

## 2016-02-21 NOTE — Progress Notes (Signed)
[place] NOTE   Pharmacy Consult for TPN Indication: Worsening colonic ileus  Patient Measurements: Height: _0  (185.4 cm) Weight: 260 lb 9.3 oz (118.2 kg) IBW/kg (Calculated) : 79.9 TPN AdjBW (KG): 89.2 Body mass index is 34.38 kg/m.  Assessment: 70 year old obese male now s/p CABG x3 who was receiving carb modified diet from 11/29 until 12/1 with fair to poor intake, then transitioned to NPO on 12/2 due to nausea and vomiting. On 12/3 patient had trial of clear liquid diet with fair intake, however now transitioning 12/4 to TPN for worsening ileus.   GI: Abdomen xray 12/4 showing progressively worsening ileus compared to 12/3 xray, on TPN per primary. Ileus improving, re-initiated clear liquids today with plans to advance in AM, cont TPN for now. Medium amount of loose stool recorded- on docusate and started metoclopramide q6h. Pre-albumin 14.9. PPI po.  Endo: DM -CBG 137-172 on TCTS SSI q4h.  Insulin requirements in the past 24 hours: 14 units (doubled) Lytes: K 3.9 (goal >/=4 w/ ileus) - s/p multi-Krun x3 & KCl PO 54mq x2.  Mag 2.2 (goal >/=2 w/ ileus). Phos up 3.3. Renal: SCr 1.32, UOP 0.6 cc/kg/hr. IVF- NS at 20 ml/h. Net + 3.4L this admission Pulm: RA Cards: HTN, s/p CABG 11/28.BP elevated, NSR. Amiodarone, asa, Atorvastatin, ARB, lopressor PRN Hepatobil: LFTs/Tbili/alk phos WNL. TG [id number]: Prn oxycodone. ID: FTressie Ellisper MD for sternal drainage and possible infection. D#3. Afebrile, wbc 11.2. No cultures currently.   Best Practices: Lovenox TPN Access: PICC placement 12/4 TPN start date: 12/4 >>  Nutritional Goals (per RD recommendation on 12/4): KCal:  2200-2400 Protein:  115-125  Current Nutrition:  Clear Liquid (initiated 12/6) TPN  Plan:  -Continue Clinimix to E 5/15 and increase rate to 83 ml/hr  -Continue IV lipid emulsion at 10 ml/hr as patient is not critically ill -TPN will provide 100g protein + 1894kcal in  24h period - meeting > 80% of patient's needs -Multivitamin and trace elements in TPN -Monitor CBGs on TCTS q4h SSI -F/u AM labs -Follow-up toleration of clear liquids, advancement of enteral nutrition, and ability to wean off TPN.   JSloan Leiter PharmD, BCPS Clinical Pharmacist #[phone] 3:30 PM today [phone] hours 02/21/2016 9:08 AM

## 2016-02-21 NOTE — Plan of Care (Signed)
Problem: Activity: Goal: Risk for activity intolerance will decrease Outcome: Progressing Patient ambulated 900 feet, modified independent with front wheel walker.  Problem: Bowel/Gastric: Goal: Gastrointestinal status for postoperative course will improve Outcome: Progressing Patient passing gas and positive bowel sounds. No BM over night.  Problem: Respiratory: Goal: Levels of oxygenation will improve Outcome: Progressing Patient not requiring any supplemental oxygen.  Problem: Pain Management: Goal: Pain level will decrease Outcome: Progressing Patient had no complaints of pain overnight.

## 2016-02-21 NOTE — Progress Notes (Addendum)
TCTS BRIEF SICU PROGRESS NOTE  8 Days Post-Op  S/P Procedure(s) (LRB): CORONARY ARTERY BYPASS GRAFTING (CABG)x 3 with endoscopic harvesting of right saphenous  vein LIMA to LAD SVG to Ramus SVG to PDA (N/A) TRANSESOPHAGEAL ECHOCARDIOGRAM (TEE) (N/A)   Stable day.  Ambulated very well today NSR w/ hypertension BP 180/94 O2 sats 96% room air Tolerating liquids well and several bowel movements  Plan: Awaiting bed for transfer.  Will add intermittent labetalol as needed for hypertension  Purcell Nailslarence H Owen, MD 02/21/2016 6:48 PM

## 2016-02-22 LAB — CBC
HCT: 33 % — ABNORMAL LOW (ref 39.0–52.0)
Hemoglobin: 10.7 g/dL — ABNORMAL LOW (ref 13.0–17.0)
MCH: 28.8 pg (ref 26.0–34.0)
MCHC: 32.4 g/dL (ref 30.0–36.0)
MCV: 88.9 fL (ref 78.0–100.0)
Platelets: 242 10*3/uL (ref 150–400)
RBC: 3.71 MIL/uL — ABNORMAL LOW (ref 4.22–5.81)
RDW: 14.4 % (ref 11.5–15.5)
WBC: 12.1 10*3/uL — ABNORMAL HIGH (ref 4.0–10.5)

## 2016-02-22 LAB — COMPREHENSIVE METABOLIC PANEL
ALT: 30 U/L (ref 17–63)
AST: 20 U/L (ref 15–41)
Albumin: 2.5 g/dL — ABNORMAL LOW (ref 3.5–5.0)
Alkaline Phosphatase: 82 U/L (ref 38–126)
Anion gap: 9 (ref 5–15)
BUN: 21 mg/dL — ABNORMAL HIGH (ref 6–20)
CO2: 25 mmol/L (ref 22–32)
Calcium: 8.5 mg/dL — ABNORMAL LOW (ref 8.9–10.3)
Chloride: 103 mmol/L (ref 101–111)
Creatinine, Ser: 1.41 mg/dL — ABNORMAL HIGH (ref 0.61–1.24)
GFR calc Af Amer: 57 mL/min — ABNORMAL LOW (ref >60)
GFR calc non Af Amer: 49 mL/min — ABNORMAL LOW (ref >60)
Glucose, Bld: 170 mg/dL — ABNORMAL HIGH (ref 65–99)
Potassium: 3.6 mmol/L (ref 3.5–5.1)
Sodium: 137 mmol/L (ref 135–145)
Total Bilirubin: 0.7 mg/dL (ref 0.3–1.2)
Total Protein: 5.3 g/dL — ABNORMAL LOW (ref 6.5–8.1)

## 2016-02-22 LAB — GLUCOSE, CAPILLARY
GLUCOSE-CAPILLARY: 108 mg/dL — AB (ref 65–99)
GLUCOSE-CAPILLARY: 107 mg/dL — AB (ref 65–99)
Glucose-Capillary: 169 mg/dL — ABNORMAL HIGH (ref 65–99)
Glucose-Capillary: 174 mg/dL — ABNORMAL HIGH (ref 65–99)

## 2016-02-22 LAB — PHOSPHORUS: Phosphorus: 4.4 mg/dL (ref 2.5–4.6)

## 2016-02-22 LAB — MAGNESIUM: Magnesium: 2.1 mg/dL (ref 1.7–2.4)

## 2016-02-22 MED ORDER — SODIUM CHLORIDE 0.9 % IV SOLN
30.0000 meq | Freq: Once | INTRAVENOUS | Status: AC
  Administered 2016-02-22: 30 meq via INTRAVENOUS
  Filled 2016-02-22: qty 15

## 2016-02-22 MED ORDER — ENOXAPARIN SODIUM 40 MG/0.4ML ~~LOC~~ SOLN
40.0000 mg | SUBCUTANEOUS | Status: DC
  Administered 2016-02-23: 40 mg via SUBCUTANEOUS
  Filled 2016-02-22: qty 0.4

## 2016-02-22 MED ORDER — POTASSIUM CHLORIDE CRYS ER 20 MEQ PO TBCR
20.0000 meq | EXTENDED_RELEASE_TABLET | Freq: Two times a day (BID) | ORAL | Status: DC
  Administered 2016-02-22 – 2016-02-26 (×9): 20 meq via ORAL
  Filled 2016-02-22: qty 1
  Filled 2016-02-22: qty 2
  Filled 2016-02-22 (×3): qty 1
  Filled 2016-02-22: qty 2
  Filled 2016-02-22 (×2): qty 1
  Filled 2016-02-22 (×2): qty 2
  Filled 2016-02-22: qty 1

## 2016-02-22 NOTE — Progress Notes (Signed)
Patient received from RN on 2S. Patient sitting in chair, vitals WNL, telemetry applied, CCMD called and verified. Oriented to room and equipment. Call bell in reach. No questions at this time.  Minerva Endsiffany N Cambelle Suchecki  RN

## 2016-02-22 NOTE — Progress Notes (Signed)
Pacing wires removed without incident.

## 2016-02-22 NOTE — Progress Notes (Addendum)
PHARMACY - ADULT TOTAL PARENTERAL NUTRITION CONSULT NOTE   Pharmacy Consult:  TPN Indication:  Worsening colonic ileus  Patient Measurements: Height: 6\' 1"  (185.4 cm) Weight: 251 lb 5.2 oz (114 kg) IBW/kg (Calculated) : 79.9 TPN AdjBW (KG): 89.2 Body mass index is 33.16 kg/m.  Assessment:  70 year old obese male s/p CABG x3 who was receiving carb modified diet from 11/29 until 02/16/16 with fair to poor intake, then transitioned to NPO on 02/17/16 due to nausea and vomiting. On 02/18/16 patient had trial of clear liquid diet with fair intake, however transitioned to TPN on 02/19/16 for worsening ileus on abdominal Xray.   GI: ileus improving.  Baseline prealbumin 14.9.  Reglan, PPI PO, bisacodyl + docusate.  Tolerated liquid and had BMs Endo: DM - CBGs controlled Insulin requirements in the past 24 hours: 10 units SSI Lytes: K 3.6 (goal >/= 4 for ileus, 3 runs ordered), others WNL Renal: SCr up 1.41, BUN 21 - good UOP 2.3 ml/kg/hr with Lasix Pulm: stable on RA Cards: HTN, s/p CABG 11/28 - BP improving, HR controlled - amiodarone, ASA, Lipitor, Coreg, losartan, Lasix + metolazone, PRN Lopressor/labetalol Hepatobil: LFTs / tbili WNL.  TG 233 > 173 Neuro: A&O, no issue ID: James Clayton D#4 for sternal drainage and possible infxn - afebrile, WBC up 12.1, no culture Best Practices: Lovenox TPN Access: PICC placement 02/19/16 TPN start date: 02/19/16  Nutritional Goals (per RD recommendation on 12/4): 2200-2400 kCal and 115-125 g protein per day  Current Nutrition:  TPN Dysphagia 3 diet   Plan:  - Reduce Clinimix  E 5/15 to 40 ml/hr, then d/c at 1800.  D/C lipids at 1800. - D/C TPN labs and nursing care orders - Consider establishing stop date for abx   James Clayton, PharmD, BCPS Pager:  [phone] 02/22/2016, 8:35 AM

## 2016-02-22 NOTE — Progress Notes (Signed)
9 Days Post-Op Procedure(s) (LRB): CORONARY ARTERY BYPASS GRAFTING (CABG)x 3 with endoscopic harvesting of right saphenous  vein LIMA to LAD SVG to Ramus SVG to PDA (N/A) TRANSESOPHAGEAL ECHOCARDIOGRAM (TEE) (N/A) Subjective: CABG for NSTEMI with CHF Colonic ileus on TPN Perineal edema on lasix, metolazone nsr on amiodarone Waiting on telemetry bed Objective: Vital signs in last 24 hours: Temp:  [97.3 F (36.3 C)-99.3 F (37.4 C)] 98.5 F (36.9 C) (12/07 0400) Pulse Rate:  [67-80] 73 (12/07 0700) Cardiac Rhythm: Normal sinus rhythm (12/07 0600) Resp:  [14-36] 20 (12/07 0700) BP: (133-182)/(62-96) 139/79 (12/07 0700) SpO2:  [94 %-99 %] 95 % (12/07 0700) Weight:  [251 lb 5.2 oz (114 kg)] 251 lb 5.2 oz (114 kg) (12/07 0500)  Hemodynamic parameters for last 24 hours:  stable  Intake/Output from previous day: 12/06 0701 - 12/07 0700 In: 2700.2 [I.V.:2165.2; IV Piggyback:415] Out: 6375 [Urine:6375] Intake/Output this shift: No intake/output data recorded.       Exam    General- alert and comfortable   Lungs- clear without rales, wheezes   Cor- regular rate and rhythm, no murmur , gallop   Abdomen- soft, non-tender   Extremities - warm, non-tender, minimal edema   Neuro- oriented, appropriate, no focal weakness   Lab Results:  Recent Labs  02/21/16 0510 02/22/16 0300  WBC 11.2* 12.1*  HGB 9.9* 10.7*  HCT 31.2* 33.0*  PLT 216 242   BMET:  Recent Labs  02/21/16 0510 02/22/16 0300  NA 139 137  K 3.9 3.6  CL 109 103  CO2 24 25  GLUCOSE 163* 170*  BUN 20 21*  CREATININE 1.32* 1.41*  CALCIUM 8.0* 8.5*    PT/INR: No results for input(s): LABPROT, INR in the last 72 hours. ABG    Component Value Date/Time   PHART 7.365 02/13/2016 2248   HCO3 20.1 02/13/2016 2248   TCO2 22 02/19/2016 1538   ACIDBASEDEF 5.0 (H) 02/13/2016 2248   O2SAT 77.4 02/18/2016 0930   CBG (last 3)   Recent Labs  02/21/16 1232 02/21/16 1602 02/21/16 2139  GLUCAP 131* 135* 146*     Assessment/Plan: S/P Procedure(s) (LRB): CORONARY ARTERY BYPASS GRAFTING (CABG)x 3 with endoscopic harvesting of right saphenous  vein LIMA to LAD SVG to Ramus SVG to PDA (N/A) TRANSESOPHAGEAL ECHOCARDIOGRAM (TEE) (N/A) Mobilize Diuresis Diabetes control advance diet and wean TPN   LOS: 10 days    Kathlee Nationseter Van Trigt III 02/22/2016

## 2016-02-22 NOTE — Progress Notes (Signed)
CARDIAC REHAB PHASE I   PRE:  Rate/Rhythm: 72 SR  BP:  Supine:   Sitting: 128/74  Standing:    SaO2: 98%RA  MODE:  Ambulation: 550 ft   POST:  Rate/Rhythm: 82 SR  BP:  Supine:   Sitting: 150/76  Standing:    SaO2: 97%RA 1325-1405 Pt walked 550 ft on RA with rolling walker and minimal asst. Tolerated well. Pt very motivated to walk. Set up lunch for pt. Stated he has rolling walker at home if needed.   Luetta Nuttingharlene Blanchard [place], RN BSN  02/22/2016 2:02 PM

## 2016-02-23 LAB — CBC
HCT: 32.1 % — ABNORMAL LOW (ref 39.0–52.0)
Hemoglobin: 10.4 g/dL — ABNORMAL LOW (ref 13.0–17.0)
MCH: 28.7 pg (ref 26.0–34.0)
MCHC: 32.4 g/dL (ref 30.0–36.0)
MCV: 88.7 fL (ref 78.0–100.0)
Platelets: 253 10*3/uL (ref 150–400)
RBC: 3.62 MIL/uL — ABNORMAL LOW (ref 4.22–5.81)
RDW: 14.4 % (ref 11.5–15.5)
WBC: 13.2 10*3/uL — ABNORMAL HIGH (ref 4.0–10.5)

## 2016-02-23 LAB — GLUCOSE, CAPILLARY
GLUCOSE-CAPILLARY: 134 mg/dL — AB (ref 65–99)
GLUCOSE-CAPILLARY: 112 mg/dL — AB (ref 65–99)
Glucose-Capillary: 131 mg/dL — ABNORMAL HIGH (ref 65–99)
Glucose-Capillary: 130 mg/dL — ABNORMAL HIGH (ref 65–99)
Glucose-Capillary: 116 mg/dL — ABNORMAL HIGH (ref 65–99)

## 2016-02-23 LAB — COMPREHENSIVE METABOLIC PANEL
ALT: 29 U/L (ref 17–63)
AST: 18 U/L (ref 15–41)
Albumin: 2.6 g/dL — ABNORMAL LOW (ref 3.5–5.0)
Alkaline Phosphatase: 78 U/L (ref 38–126)
Anion gap: 11 (ref 5–15)
BUN: 27 mg/dL — ABNORMAL HIGH (ref 6–20)
CO2: 25 mmol/L (ref 22–32)
Calcium: 8.7 mg/dL — ABNORMAL LOW (ref 8.9–10.3)
Chloride: 100 mmol/L — ABNORMAL LOW (ref 101–111)
Creatinine, Ser: 1.71 mg/dL — ABNORMAL HIGH (ref 0.61–1.24)
GFR calc Af Amer: 45 mL/min — ABNORMAL LOW (ref >60)
GFR calc non Af Amer: 39 mL/min — ABNORMAL LOW (ref >60)
Glucose, Bld: 116 mg/dL — ABNORMAL HIGH (ref 65–99)
Potassium: 4.2 mmol/L (ref 3.5–5.1)
Sodium: 136 mmol/L (ref 135–145)
Total Bilirubin: 0.7 mg/dL (ref 0.3–1.2)
Total Protein: 5 g/dL — ABNORMAL LOW (ref 6.5–8.1)

## 2016-02-23 MED ORDER — LOSARTAN POTASSIUM 50 MG PO TABS
50.0000 mg | ORAL_TABLET | Freq: Every day | ORAL | Status: DC
  Administered 2016-02-23 – 2016-02-24 (×2): 50 mg via ORAL
  Filled 2016-02-23 (×2): qty 1

## 2016-02-23 MED ORDER — LOSARTAN POTASSIUM 50 MG PO TABS: 75.0000 mg | ORAL_TABLET | Freq: Every day | ORAL | Status: DC

## 2016-02-23 NOTE — Discharge Instructions (Signed)
Coronary Artery Bypass Grafting, Care After °Refer to this sheet in the next few weeks. These instructions provide you with information on caring for yourself after your procedure. Your health care provider may also give you more specific instructions. Your treatment has been planned according to current medical practices, but problems sometimes occur. Call your health care provider if you have any problems or questions after your procedure. °WHAT TO EXPECT AFTER THE PROCEDURE °Recovery from surgery will be different for everyone. Some people feel well after 3 or 4 weeks, while for others it takes longer. After your procedure, it is typical to have the following: °· Nausea and a lack of appetite.   °· Constipation. °· Weakness and fatigue.   °· Depression or irritability.   °· Pain or discomfort at your incision site. °HOME CARE INSTRUCTIONS °· Take medicines only as directed by your health care provider. Do not stop taking medicines or start any new medicines without first checking with your health care provider. °· Take your pulse as directed by your health care provider. °· Perform deep breathing as directed by your health care provider. If you were given a device called an incentive spirometer, use it to practice deep breathing several times a day. Support your chest with a pillow or your arms when you take deep breaths or cough. °· Keep incision areas clean, dry, and protected. Remove or change any bandages (dressings) only as directed by your health care provider. You may have skin adhesive strips over the incision areas. Do not take the strips off. They will fall off on their own. °· Check incision areas daily for any swelling, redness, or drainage. °· If incisions were made in your legs, do the following: °¨ Avoid crossing your legs.   °¨ Avoid sitting for long periods of time. Change positions every 30 minutes.   °¨ Elevate your legs when you are sitting. °· Wear compression stockings as directed by your  health care provider. These stockings help keep blood clots from forming in your legs. °· Take showers once your health care provider approves. Until then, only take sponge baths. Pat incisions dry. Do not rub incisions with a washcloth or towel. Do not take baths, swim, or use a hot tub until your health care provider approves. °· Eat foods that are high in fiber, such as raw fruits and vegetables, whole grains, beans, and nuts. Meats should be lean cut. Avoid canned, processed, and fried foods. °· Drink enough fluid to keep your urine clear or pale yellow. °· Weigh yourself every day. This helps identify if you are retaining fluid that may make your heart and lungs work harder. °· Rest and limit activity as directed by your health care provider. You may be instructed to: °¨ Stop any activity at once if you have chest pain, shortness of breath, irregular heartbeats, or dizziness. Get help right away if you have any of these symptoms. °¨ Move around frequently for short periods or take short walks as directed by your health care provider. Increase your activities gradually. You may need physical therapy or cardiac rehabilitation to help strengthen your muscles and build your endurance. °¨ Avoid lifting, pushing, or pulling anything heavier than 10 lb (4.5 kg) for at least 6 weeks after surgery. °· Do not drive until your health care provider approves.  °· Ask your health care provider when you may return to work. °· Ask your health care provider when you may resume sexual activity. °· Keep all follow-up visits as directed by your health care   provider. This is important. °SEEK MEDICAL CARE IF: °· You have swelling, redness, increasing pain, or drainage at the site of an incision. °· You have a fever. °· You have swelling in your ankles or legs. °· You have pain in your legs.   °· You gain 2 or more pounds (0.9 kg) a day. °· You are nauseous or vomit. °· You have diarrhea.  °SEEK IMMEDIATE MEDICAL CARE IF: °· You have  chest pain that goes to your jaw or arms. °· You have shortness of breath.   °· You have a fast or irregular heartbeat.   °· You notice a "clicking" in your breastbone (sternum) when you move.   °· You have numbness or weakness in your arms or legs. °· You feel dizzy or light-headed.   °MAKE SURE YOU: °· Understand these instructions. °· Will watch your condition. °· Will get help right away if you are not doing well or get worse. °This information is not intended to replace advice given to you by your health care provider. Make sure you discuss any questions you have with your health care provider. °Document Released: 09/21/2004 Document Revised: 03/25/2014 Document Reviewed: 08/11/2012 °Elsevier Interactive Patient Education © 2017 [employer] ° °

## 2016-02-23 NOTE — Discharge Summary (Signed)
Physician Discharge Summary  Patient ID: Nelson ChimesJames Bethel MRN: [id number] DOB/AGE: [birth year] 70 y.o.  Admit date: 02/12/2016 Discharge date: 02/26/2016  Admission Diagnoses: Patient Active Problem List   Diagnosis Date Noted  . S/P CABG x 3 02/13/2016  . Diabetes mellitus with complication (HCC) 02/12/2016  . Chronic diastolic heart failure (HCC) 02/12/2016  . CAD in native artery 02/12/2016  . NSTEMI (non-ST elevated myocardial infarction) (HCC) 02/12/2016  . Hyperlipidemia   . Non-STEMI (non-ST elevated myocardial infarction) (HCC) 02/09/2016  . Hypertension     Discharge Diagnoses:  Principal Problem:   Non-STEMI (non-ST elevated myocardial infarction) (HCC) Active Problems:   Hypertension   Hyperlipidemia   Diabetes mellitus with complication (HCC)   Chronic diastolic heart failure (HCC)   CAD in native artery   NSTEMI (non-ST elevated myocardial infarction) (HCC)   S/P CABG x 3   Discharged Condition: good  HPI:  The patient was examined, 2-D echocardiogram and coronary angiograms personally reviewed and counseled with patient. This is a 70 year old obese Caucasian male diabetic reformed smoker admitted [facility] 5 days ago for chest pain, non-STEMI. Echocardiogram shows normal LV systolic function with mild mitral regurgitation. Patient was transferred to [place] today after cardiac catheterization showed significant left main and three-vessel CAD and surgical coronary revascularization was recommended for this patient by his cardiologist Dr. Kirke CorinArida.   The patient was cathed via right radial artery. He has a 90% left main stenosis. He has total RCA occlusion, chronic He has high-grade 90% stenosis of his circumflex with small distal vessel He has 80% proximal stenosis of the ramus intermediate LVEDP was elevated at 35 mmHg  The patient has chronic renal insufficiency creatinine on presentation was approximately 2.0. CKD stage II.  Hemoglobin A1c is 7.1. Chest x-ray shows mild interstitial edema. Pre-CABG Dopplers show no significant carotid disease. PFTs show adequate mechanics for sternotomy.   Hospital Course:  On 02/13/2016 Mr. Idolina PrimerUnderwood underwent a coronary artery bypass grafting 3 by Dr. Kathlee NationsPeter Van Trigt. He tolerated the procedure well and was transferred to the ICU. He was extubated in a timely manner. He remained on inotropic support. We continued his chest tubes due to output. We continue to monitor his creatinine closely. We were able to discontinue his Elizbeth SquiresSwan Gantz catheter. He remained in the ICU. On postop day 2 he remained on low-dose inotropic support, however we were able to wean him off of nitroglycerin. We were able to discontinue his chest tubes this day. He was down to 2 L of oxygen per nasal cannula. We increased his beta blocker for better blood pressure control. He continued to diurese for fluid overload. We removed his Foley catheter. His hemoglobin and hematocrit remained stable. On postoperative day 3 he went into atrial fibrillation with a rate of 130. We followed the protocol and initiated an amiodarone bolus and drip. We stopped his milrinone. He continued to make good progress. We switched his amiodarone to Cardizem for better heart rate control. On postop day 4 he had some nausea and vomiting. A KUB revealed an ileus. He was made nothing by mouth. We advanced his diet to clear liquids and continued a medical regimen with Reglan. TPN was initiated as well as antibiotics. On postop day 9 his TPN was discontinued and he was tolerating a normal diet. He was transferred to the telemetry unit for continued care. We continue to monitor his creatinine closely. His creatinine was 2.28 12/10 and 2.32 on 12/11. He continued to work on increasing  his oral intake. Speech pathology was consulted and bedside swallow was done. Recommendation was regular, thin liquids. This will be amended to carbohydrate modified medium  calorie diabetic diet.  He continued to work on ambulation in the halls. His epicardial pacing wires were removed. He is now tolerating room air, ambulating with limited assistance, and his incisions are healing well. PICC line was removed on 12/11. After discussion with Dr. Donata Clay, patient is felt surgically stable for discharge home today.   Consults: cardiology  Significant Diagnostic Studies: EXAM: PORTABLE CHEST 1 VIEW  COMPARISON:  Radiograph of February 20, 2016.  FINDINGS: Stable cardiomegaly. Status post coronary artery bypass graft. Stable position of right-sided PICC line with distal tip in expected position of the SVC. Stable position of right subclavian catheter with distal tip in expected position of the SVC. No pneumothorax is noted. No significant pleural effusion is noted. Atherosclerosis of thoracic aorta is noted. Mild bibasilar atelectasis is noted. Bony thorax is unremarkable.  IMPRESSION: Stable mild bibasilar subsegmental atelectasis. Aortic atherosclerosis. No significant change compared to prior exam.   Electronically Signed   By: Lupita Raider, M.D.   On: 02/21/2016 08:04  CLINICAL DATA:  Ileus  EXAM: PORTABLE ABDOMEN - 1 VIEW  COMPARISON:  02/20/2016  FINDINGS: Scattered large and small bowel gas is noted. The distended colon has resolved in the interval. No obstructive changes are seen. No free air is noted. Degenerative change of the lumbar spine is again noted.  IMPRESSION: Resolution of previously seen colonic distention.   Electronically Signed   By: Alcide Clever M.D.   On: 02/21/2016 08:03   Treatments: NAME:  NAVARRO, NINE NO.:  [id number]  MEDICAL RECORD NO.:  [id number]  LOCATION:                                 FACILITY:  PHYSICIAN:  [name], M.D.  DATE OF BIRTH:  August 28, 1945  DATE OF PROCEDURE:  02/13/2016 DATE OF DISCHARGE:                              OPERATIVE  REPORT   OPERATION: 1. Coronary artery bypass grafting x3 (left internal mammary artery to     left anterior descending, saphenous vein graft to posterior     descending, saphenous vein graft to ramus intermediate). 2. Endoscopic harvest of right leg greater saphenous vein.  SURGEON:  [name], M.D.  ASSISTANT:  Jari Favre, PA-C  ANESTHESIA:  General.  PREOPERATIVE DIAGNOSES: 1. Class IV unstable angina. 2. Non-ST-elevation myocardial infarction. 3. Severe three-vessel coronary artery disease.  POSTOPERATIVE DIAGNOSES: 1. Class IV unstable angina. 2. Non-ST-elevation myocardial infarction. 3. Severe three-vessel coronary artery disease.   Discharge Exam: Blood pressure 133/62, pulse 75, temperature 98.4 F (36.9 C), temperature source Oral, resp. rate 18, height 6\' 1"  (1.854 m), weight 234 lb 8 oz (106.4 kg), SpO2 95 %.   Cardiovascular: RRR Pulmonary: Mostly clear to auscultation bilaterally Abdomen: Soft, non tender, bowel sounds present. Extremities: Trace  bilateral lower extremity edema. Wounds: Clean and dry.  No erythema or signs of infection.  Disposition: 02-Transferred to [facility]     Medication List    STOP taking these medications   furosemide 40 MG tablet Commonly known as:  LASIX   losartan 100  MG tablet Commonly known as:  COZAAR     TAKE these medications   allopurinol 100 MG tablet Commonly known as:  ZYLOPRIM Take 100 mg by mouth 2 (two) times daily.   amiodarone 200 MG tablet Commonly known as:  PACERONE Take 1 tablet (200 mg total) by mouth 2 (two) times daily. For one week;then take Amiodarone 200 mg by mouth daily thereafter   amLODipine 5 MG tablet Commonly known as:  NORVASC Take 1 tablet (5 mg total) by mouth daily.   aspirin 325 MG EC tablet Take 1 tablet (325 mg total) by mouth daily. What changed:  medication strength  how much to take   atorvastatin 40 MG tablet Commonly known as:   LIPITOR Take 1 tablet (40 mg total) by mouth daily. What changed:  medication strength  how much to take   carvedilol 25 MG tablet Commonly known as:  COREG Take 25 mg by mouth daily.   glipiZIDE 2.5 MG 24 hr tablet Commonly known as:  GLUCOTROL XL Take 2.5 mg by mouth daily.   neomycin-bacitracin-polymyxin Oint Commonly known as:  NEOSPORIN Apply 1 application topically daily.   omega-3 acid ethyl esters 1 g capsule Commonly known as:  LOVAZA Take 1 capsule (1 g total) by mouth 2 (two) times daily.   sildenafil 20 MG tablet Commonly known as:  REVATIO Take 1-2 tablets (20-40 mg total) by mouth as needed. Please discuss with cardiologist when to resume What changed:  additional instructions   terazosin 5 MG capsule Commonly known as:  HYTRIN Take 5 mg by mouth at bedtime.   traMADol 50 MG tablet Commonly known as:  ULTRAM Take 1 tablet (50 mg total) by mouth every 12 (twelve) hours as needed for moderate pain.      Follow-up Information    [name],[name], DO. Call in 1 day(s).   Specialty:  Family Medicine Why:  Call for a follow up appointment regarding further diabetes management and surveillance of HGA1C       [name] III, MD Follow up.   Specialty:  Cardiothoracic Surgery Why:  Your appointment is on Janurary 2rd at 2:00pm. Please arrive at 1:30pm for a chest xray at [facility] which is located on the first floor of our building.  Contact information: [address] [place] [address] [phone]        Eula Listen, PA-C Follow up on 03/13/2016.   Specialties:  Physician Assistant, Cardiology, Radiology Why:  Appointment time is at 2:30 pm Contact information: [address] [place] [address] [phone]          The patient has been discharged on:   1.Beta Blocker:  Yes [ x  ]                              No   [   ]                              If No, reason:  2.Ace Inhibitor/ARB: Yes [    ]                                     No  [ x   ]  If No, reason:Elevated creatinine (has CKD, stage III)  3.Statin:   Yes [ x  ]                  No  [   ]                  If No, reason:  4.Ecasa:  Yes  [ x  ]                  No   [   ]                  If No, reason:    Signed: Acie Custis M PA-C 02/26/2016, 8:31 AM

## 2016-02-23 NOTE — Progress Notes (Signed)
CARDIAC REHAB PHASE I   PRE:  Rate/Rhythm: 82 SR    BP: sitting 129/66    SaO2: 97 RA  MODE:  Ambulation: 600 ft   POST:  Rate/Rhythm: 100 ST    BP: sitting 142/68     SaO2: 100 RA  Pt has been eager to walk all day. Able to stand independently and walk independently. I pushed IV pole. Quick pace now that he is not using RW. Tired after walk. Ed completed with pt and wife. Voiced understanding and [facility]. Gave video to watch and encouraged more walking. He can go indepedently. [phone]  Harriet MassonRandi Kristan Lovetta Condie CES, ACSM 02/23/2016 2:17 PM

## 2016-02-23 NOTE — Care Management Important Message (Signed)
Important Message  Patient Details  Name: James Clayton MRN: [id number] Date of Birth: Sep 26, 1945   Medicare Important Message Given:  Yes    Kyla BalzarineShealy, Benen Weida Abena 02/23/2016, 12:13 PM

## 2016-02-23 NOTE — Progress Notes (Signed)
Called Carrier MillsWayne Gold, [place] concerning patient urinary retention.  Patient voided 175 ml and bladder scan afterwards showed 366 ml.  We will continue to watch patient as he is voiding. Penis and scrotal area are swollen from foley catheter use. Patient OK to monitor. Pt resting with call bell within reach.  Will continue to monitor. Thomas HoffBurton, Dottie Vaquerano McClintock, RN

## 2016-02-23 NOTE — Progress Notes (Addendum)
      [address]       [place] [id number]             [phone]      10 Days Post-Op Procedure(s) (LRB): CORONARY ARTERY BYPASS GRAFTING (CABG)x 3 with endoscopic harvesting of right saphenous  vein LIMA to LAD SVG to Ramus SVG to PDA (N/A) TRANSESOPHAGEAL ECHOCARDIOGRAM (TEE) (N/A) Subjective: No issues overnight. Tolerating a full diet.  Objective: Vital signs in last 24 hours: Temp:  [97.7 F (36.5 C)-98.2 F (36.8 C)] 97.7 F (36.5 C) (12/08 0438) Pulse Rate:  [72-85] 84 (12/08 0438) Cardiac Rhythm: Normal sinus rhythm (12/07 1900) Resp:  [14-31] 14 (12/08 0438) BP: (135-161)/(68-110) 156/72 (12/08 0438) SpO2:  [96 %-99 %] 96 % (12/08 0438) Weight:  [248 lb 6.4 oz (112.7 kg)] 248 lb 6.4 oz (112.7 kg) (12/08 0438)     Intake/Output from previous day: 12/07 0701 - 12/08 0700 In: 1066 [P.O.:720; I.V.:246; IV Piggyback:100] Out: 3570 [Urine:3570] Intake/Output this shift: No intake/output data recorded.  General appearance: alert, cooperative and no distress Heart: regular rate and rhythm Lungs: clear to auscultation bilaterally and diminished in bilateral lower lobes Abdomen: soft, non-tender; bowel sounds normal; no masses,  no organomegaly Extremities: 1+ pitting pedal edema Wound: clean and dry without drainage Lab Results:  Recent Labs  02/22/16 0300 02/23/16 0319  WBC 12.1* 13.2*  HGB 10.7* 10.4*  HCT 33.0* 32.1*  PLT 242 253   BMET:  Recent Labs  02/22/16 0300 02/23/16 0319  NA 137 136  K 3.6 4.2  CL 103 100*  CO2 25 25  GLUCOSE 170* 116*  BUN 21* 27*  CREATININE 1.41* 1.71*  CALCIUM 8.5* 8.7*    PT/INR: No results for input(s): LABPROT, INR in the last 72 hours. ABG    Component Value Date/Time   PHART 7.365 02/13/2016 2248   HCO3 20.1 02/13/2016 2248   TCO2 22 02/19/2016 1538   ACIDBASEDEF 5.0 (H) 02/13/2016 2248   O2SAT 77.4 02/18/2016 0930   CBG (last 3)   Recent Labs  02/22/16 2129 02/23/16 0442  02/23/16 0624  GLUCAP 107* 131* 130*    Assessment/Plan: S/P Procedure(s) (LRB): CORONARY ARTERY BYPASS GRAFTING (CABG)x 3 with endoscopic harvesting of right saphenous  vein LIMA to LAD SVG to Ramus SVG to PDA (N/A) TRANSESOPHAGEAL ECHOCARDIOGRAM (TEE) (N/A)  1. CV-BP somewhat uncontrolled. On coreg 25mg  and Losartan 50mg . Continue. HR NSR in the 80s. On Amio 400mg  BID. EPW out. 2. Pulm-tolerating room air with good oxygenation.  3. Renal-creatinine 1.7 this morning. On Zaroxolyn and Lasix. Good urine output. Weight trending down 4. Endocrine-blood sugar well controlled.  5.  H and H stable 6. GI-colonic ileus, off TPN. Advance diet as tolerated.   Plan: continue to mobilize. Encourage Is/pulm toilet. Continue to advance diet as tolerated. Will continue diuretic regimen for fluid overload. Possibly home this weekend.   LOS: 11 days    Sharlene Doryessa N Carlo Guevarra 02/23/2016

## 2016-02-23 NOTE — Care Management Note (Signed)
Case Management Note Previous CM note initiated by Magdalene RiverMayo, Henrietta T, RN 02/16/2016, 10:12 AM   Patient Details  Name: James Clayton MRN: [id number] Date of Birth: 07-May-1945  Subjective/Objective:  Pt lives with wife, daughter lives across the street and is a Engineer, civil (consulting)nurse, son lives nearby and is a Radiation protection practitionerparamedic.  Wife will provide 24/7 assistance, reports adult children are very supportive and will help as needed.  Pt has cane, walker, and BSC.                           Action/Plan: Pt s/p CABGx3- tx from 2S to 2W on 02/22/16-   Expected Discharge Date:                  Expected Discharge Plan:  Home/Self Care  In-House Referral:     Discharge planning Services  CM Consult  Post Acute Care Choice:    Choice offered to:     DME Arranged:    DME Agency:     HH Arranged:    HH Agency:     Status of Service:  In process, will continue to follow  If discussed at Long Length of Stay Meetings, dates discussed:    Additional Comments:  [name], [name], RN 02/23/2016, 12:14 PM [phone]

## 2016-02-24 LAB — BASIC METABOLIC PANEL
ANION GAP: 10 (ref 5–15)
BUN: 33 mg/dL — ABNORMAL HIGH (ref 6–20)
CALCIUM: 9 mg/dL (ref 8.9–10.3)
CHLORIDE: 101 mmol/L (ref 101–111)
CO2: 26 mmol/L (ref 22–32)
Creatinine, Ser: 2.19 mg/dL — ABNORMAL HIGH (ref 0.61–1.24)
GFR calc non Af Amer: 29 mL/min — ABNORMAL LOW (ref >60)
GFR, EST AFRICAN AMERICAN: 33 mL/min — AB (ref >60)
Glucose, Bld: 158 mg/dL — ABNORMAL HIGH (ref 65–99)
POTASSIUM: 4.4 mmol/L (ref 3.5–5.1)
Sodium: 137 mmol/L (ref 135–145)

## 2016-02-24 LAB — GLUCOSE, CAPILLARY
GLUCOSE-CAPILLARY: 153 mg/dL — AB (ref 65–99)
GLUCOSE-CAPILLARY: 148 mg/dL — AB (ref 65–99)
Glucose-Capillary: 131 mg/dL — ABNORMAL HIGH (ref 65–99)
Glucose-Capillary: 126 mg/dL — ABNORMAL HIGH (ref 65–99)

## 2016-02-24 MED ORDER — GLIPIZIDE ER 2.5 MG PO TB24
2.5000 mg | ORAL_TABLET | Freq: Every day | ORAL | Status: DC
  Administered 2016-02-25 – 2016-02-26 (×2): 2.5 mg via ORAL
  Filled 2016-02-24 (×3): qty 1

## 2016-02-24 MED ORDER — ENOXAPARIN SODIUM 30 MG/0.3ML ~~LOC~~ SOLN: 30.0000 mg | SUBCUTANEOUS | Status: DC

## 2016-02-24 MED ORDER — OMEGA-3-ACID ETHYL ESTERS 1 G PO CAPS
1.0000 g | ORAL_CAPSULE | Freq: Two times a day (BID) | ORAL | Status: DC
  Administered 2016-02-24 – 2016-02-26 (×5): 1 g via ORAL
  Filled 2016-02-24 (×5): qty 1

## 2016-02-24 NOTE — Progress Notes (Signed)
Pt ambulating hall independently 

## 2016-02-24 NOTE — Progress Notes (Addendum)
[address]       [place] [id number]             [phone]      11 Days Post-Op Procedure(s) (LRB): CORONARY ARTERY BYPASS GRAFTING (CABG)x 3 with endoscopic harvesting of right saphenous  vein LIMA to LAD SVG to Ramus SVG to PDA (N/A) TRANSESOPHAGEAL ECHOCARDIOGRAM (TEE) (N/A) Subjective: Feels well, had BM, urination is improving  Objective: Vital signs in last 24 hours: Temp:  [98 F (36.7 C)-98.2 F (36.8 C)] 98.2 F (36.8 C) (12/09 0300) Pulse Rate:  [73-86] 81 (12/09 0300) Cardiac Rhythm: Normal sinus rhythm (12/09 0700) Resp:  [18] 18 (12/09 0300) BP: (129-142)/(62-66) 142/64 (12/09 0300) SpO2:  [96 %-100 %] 97 % (12/09 0300) Weight:  [240 lb 14.4 oz (109.3 kg)-240 lb 15.4 oz (109.3 kg)] 240 lb 15.4 oz (109.3 kg) (12/09 0500)  Hemodynamic parameters for last 24 hours:    Intake/Output from previous day: 12/08 0701 - 12/09 0700 In: 820 [P.O.:720; IV Piggyback:100] Out: 2775 [Urine:2775] Intake/Output this shift: No intake/output data recorded.  General appearance: alert, cooperative and no distress Heart: regular rate and rhythm Lungs: clear to auscultation bilaterally Abdomen: benign Extremities: + edema, improving per patient including scrotal Wound: incis all healing well  Lab Results:  Recent Labs  02/22/16 0300 02/23/16 0319  WBC 12.1* 13.2*  HGB 10.7* 10.4*  HCT 33.0* 32.1*  PLT 242 253   BMET:  Recent Labs  02/22/16 0300 02/23/16 0319  NA 137 136  K 3.6 4.2  CL 103 100*  CO2 25 25  GLUCOSE 170* 116*  BUN 21* 27*  CREATININE 1.41* 1.71*  CALCIUM 8.5* 8.7*    PT/INR: No results for input(s): LABPROT, INR in the last 72 hours. ABG    Component Value Date/Time   PHART 7.365 02/13/2016 2248   HCO3 20.1 02/13/2016 2248   TCO2 22 02/19/2016 1538   ACIDBASEDEF 5.0 (H) 02/13/2016 2248   O2SAT 77.4 02/18/2016 0930   CBG (last 3)   Recent Labs  02/23/16 1637 02/23/16 2057 02/24/16 0618  GLUCAP 134* 112*  131*    Meds Scheduled Meds: . amiodarone  400 mg Oral BID  . aspirin EC  325 mg Oral Daily   Or  . aspirin  324 mg Per Tube Daily  . atorvastatin  40 mg Oral q1800  . bisacodyl  10 mg Rectal Daily  . carvedilol  25 mg Oral BID WC  . cefTAZidime (FORTAZ)  IV  1 g Intravenous Q8H  . chlorhexidine  15 mL Mouth Rinse BID  . docusate sodium  200 mg Oral Daily  . enoxaparin (LOVENOX) injection  40 mg Subcutaneous Q24H  . furosemide  40 mg Oral Daily  . insulin aspart  0-15 Units Subcutaneous TID WC  . insulin aspart  0-5 Units Subcutaneous QHS  . losartan  50 mg Oral Daily  . mouth rinse  15 mL Mouth Rinse q12n4p  . neomycin-bacitracin-polymyxin   Topical Daily  . pantoprazole  40 mg Oral Daily  . potassium chloride SA  20 mEq Oral BID  . silver sulfADIAZINE   Topical BID  . sodium chloride flush  10-40 mL Intracatheter Q12H  . terazosin  5 mg Oral QHS   Continuous Infusions: PRN Meds:.labetalol, metoprolol, ondansetron (ZOFRAN) IV, oxyCODONE, sodium chloride flush, traMADol  Xrays No results found.  Assessment/Plan: S/P Procedure(s) (LRB): CORONARY ARTERY BYPASS GRAFTING (CABG)x 3 with endoscopic harvesting of right saphenous  vein LIMA  to LAD SVG to Ramus SVG to PDA (N/A) TRANSESOPHAGEAL ECHOCARDIOGRAM (TEE) (N/A)  1 conts to improve 2 BP control is reasonable, although systolic high at times, will recheck BMET as creat rising so may need to alter meds if not tolerating ARB 3 cont diuretic, UO is good, may be in part a third spacing issue with low protein and albumin levels noted.  4 will need to restart glucotrol, DM2 was poorly controlled so will need aggressive dietary management of carbohydrate / glucose intake- discussed with patient and he has good understanding . He previously lost 40 Lbs on low carb intake in past. 5 cont statin - interestingly he actually has low LDL and HDL, but high TG's - will add fish oil (Lovaza) which should alter HDL /TG ratio   LOS: 12 days      GOLD,WAYNE E 02/24/2016 Patient doing well with significant improvement in edema and weight loss Creatinine 1.7-will stop Lasix and hold losartan until a.m. Value Patient should be able to go home tomorrow if creatinine remains 1.8 or less Fluid intake liberalized patient examined and medical record reviewed,agree with above note. Kathlee Nationseter Van Trigt III 02/24/2016

## 2016-02-24 NOTE — Progress Notes (Signed)
CARDIAC REHAB PHASE I   PRE:  Rate/Rhythm: 76 SR  BP:  Supine:   Sitting: 112/66  Standing:    SaO2: 96%RA  MODE:  Ambulation: 800 ft   POST:  Rate/Rhythm: 83 SR  BP:  Supine:   Sitting: 134/65  Standing:    SaO2: 98%RA 1022-1050 Pt walked 800 ft with steady gait. Pushed IV pole. Tolerated well. No complaints. No questions re ed done yesterday.   Luetta Nuttingharlene Kenniel Bergsma, RN BSN  02/24/2016 10:47 AM

## 2016-02-24 NOTE — Progress Notes (Signed)
Pt ambulating hall independently - 3rd walk today.

## 2016-02-25 LAB — BASIC METABOLIC PANEL
Anion gap: 11 (ref 5–15)
BUN: 39 mg/dL — ABNORMAL HIGH (ref 6–20)
CO2: 27 mmol/L (ref 22–32)
Calcium: 8.8 mg/dL — ABNORMAL LOW (ref 8.9–10.3)
Chloride: 100 mmol/L — ABNORMAL LOW (ref 101–111)
Creatinine, Ser: 2.28 mg/dL — ABNORMAL HIGH (ref 0.61–1.24)
GFR calc Af Amer: 32 mL/min — ABNORMAL LOW (ref >60)
GFR calc non Af Amer: 27 mL/min — ABNORMAL LOW (ref >60)
Glucose, Bld: 128 mg/dL — ABNORMAL HIGH (ref 65–99)
Potassium: 4.3 mmol/L (ref 3.5–5.1)
Sodium: 138 mmol/L (ref 135–145)

## 2016-02-25 LAB — GLUCOSE, CAPILLARY
GLUCOSE-CAPILLARY: 124 mg/dL — AB (ref 65–99)
GLUCOSE-CAPILLARY: 116 mg/dL — AB (ref 65–99)
GLUCOSE-CAPILLARY: 134 mg/dL — AB (ref 65–99)
Glucose-Capillary: 111 mg/dL — ABNORMAL HIGH (ref 65–99)

## 2016-02-25 MED ORDER — AMLODIPINE BESYLATE 5 MG PO TABS
5.0000 mg | ORAL_TABLET | Freq: Every day | ORAL | Status: DC
  Administered 2016-02-25 – 2016-02-26 (×2): 5 mg via ORAL
  Filled 2016-02-25 (×2): qty 1

## 2016-02-25 MED ORDER — AMIODARONE HCL 200 MG PO TABS
200.0000 mg | ORAL_TABLET | Freq: Two times a day (BID) | ORAL | Status: DC
  Administered 2016-02-25 – 2016-02-26 (×2): 200 mg via ORAL
  Filled 2016-02-25 (×2): qty 1

## 2016-02-25 NOTE — Progress Notes (Addendum)
[address]       [place] [id number]             [phone]      12 Days Post-Op Procedure(s) (LRB): CORONARY ARTERY BYPASS GRAFTING (CABG)x 3 with endoscopic harvesting of right saphenous  vein LIMA to LAD SVG to Ramus SVG to PDA (N/A) TRANSESOPHAGEAL ECHOCARDIOGRAM (TEE) (N/A) Subjective: Feels well  Objective: Vital signs in last 24 hours: Temp:  [97.6 F (36.4 C)-98.4 F (36.9 C)] 98.4 F (36.9 C) (12/10 0548) Pulse Rate:  [72-77] 74 (12/10 0548) Cardiac Rhythm: (P) Normal sinus rhythm (12/10 0733) Resp:  [18] 18 (12/10 0548) BP: (114-129)/(59-65) 123/65 (12/10 0548) SpO2:  [94 %-96 %] 94 % (12/10 0548) Weight:  [235 lb 11.2 oz (106.9 kg)] 235 lb 11.2 oz (106.9 kg) (12/10 0548)  Hemodynamic parameters for last 24 hours:    Intake/Output from previous day: 12/09 0701 - 12/10 0700 In: 750 [P.O.:720; I.V.:30] Out: 3095 [Urine:3095] Intake/Output this shift: No intake/output data recorded.  General appearance: alert, cooperative and no distress Heart: regular rate and rhythm Lungs: clear to auscultation bilaterally Abdomen: benign Extremities: minor LE edema Wound: oncis healing well  Lab Results:  Recent Labs  02/23/16 0319  WBC 13.2*  HGB 10.4*  HCT 32.1*  PLT 253   BMET:  Recent Labs  02/24/16 0837 02/25/16 0402  NA 137 138  K 4.4 4.3  CL 101 100*  CO2 26 27  GLUCOSE 158* 128*  BUN 33* 39*  CREATININE 2.19* 2.28*  CALCIUM 9.0 8.8*    PT/INR: No results for input(s): LABPROT, INR in the last 72 hours. ABG    Component Value Date/Time   PHART 7.365 02/13/2016 2248   HCO3 20.1 02/13/2016 2248   TCO2 22 02/19/2016 1538   ACIDBASEDEF 5.0 (H) 02/13/2016 2248   O2SAT 77.4 02/18/2016 0930   CBG (last 3)   Recent Labs  02/24/16 1604 02/24/16 2115 02/25/16 0623  GLUCAP 153* 148* 124*    Meds Scheduled Meds: . amiodarone  400 mg Oral BID  . aspirin EC  325 mg Oral Daily   Or  . aspirin  324 mg Per Tube Daily   . atorvastatin  40 mg Oral q1800  . bisacodyl  10 mg Rectal Daily  . carvedilol  25 mg Oral BID WC  . chlorhexidine  15 mL Mouth Rinse BID  . docusate sodium  200 mg Oral Daily  . enoxaparin (LOVENOX) injection  30 mg Subcutaneous Q24H  . glipiZIDE  2.5 mg Oral Q breakfast  . insulin aspart  0-15 Units Subcutaneous TID WC  . insulin aspart  0-5 Units Subcutaneous QHS  . mouth rinse  15 mL Mouth Rinse q12n4p  . neomycin-bacitracin-polymyxin   Topical Daily  . omega-3 acid ethyl esters  1 g Oral BID  . pantoprazole  40 mg Oral Daily  . potassium chloride SA  20 mEq Oral BID  . silver sulfADIAZINE   Topical BID  . sodium chloride flush  10-40 mL Intracatheter Q12H  . terazosin  5 mg Oral QHS   Continuous Infusions: PRN Meds:.metoprolol, ondansetron (ZOFRAN) IV, oxyCODONE, sodium chloride flush, traMADol  Xrays No results found.  Assessment/Plan: S/P Procedure(s) (LRB): CORONARY ARTERY BYPASS GRAFTING (CABG)x 3 with endoscopic harvesting of right saphenous  vein LIMA to LAD SVG to Ramus SVG to PDA (N/A) TRANSESOPHAGEAL ECHOCARDIOGRAM (TEE) (N/A)  1 overall good progress 2 Creat conts to bump- hold ARB/lasix,  3 will add  norvasc for HTN since off ARB 4 sugars adeq controlled for now , has good understanding of dietary control 5 recheck bmet in am 6 conts to be in sinus rhythm  LOS: 13 days    GOLD,WAYNE E 02/25/2016  Reduce amiodarone to 200 bid Home in am if creat shows improvemen. patient examined and medical record reviewed,agree with above note. Kathlee Nationseter Van Trigt III 02/25/2016

## 2016-02-25 NOTE — Progress Notes (Signed)
Pt ambulated hall independently with wife. 

## 2016-02-26 LAB — BASIC METABOLIC PANEL
ANION GAP: 9 (ref 5–15)
BUN: 41 mg/dL — ABNORMAL HIGH (ref 6–20)
CO2: 27 mmol/L (ref 22–32)
Calcium: 9.1 mg/dL (ref 8.9–10.3)
Chloride: 102 mmol/L (ref 101–111)
Creatinine, Ser: 2.32 mg/dL — ABNORMAL HIGH (ref 0.61–1.24)
GFR, EST AFRICAN AMERICAN: 31 mL/min — AB (ref >60)
GFR, EST NON AFRICAN AMERICAN: 27 mL/min — AB (ref >60)
GLUCOSE: 119 mg/dL — AB (ref 65–99)
POTASSIUM: 4.5 mmol/L (ref 3.5–5.1)
Sodium: 138 mmol/L (ref 135–145)

## 2016-02-26 LAB — GLUCOSE, CAPILLARY: Glucose-Capillary: 128 mg/dL — ABNORMAL HIGH (ref 65–99)

## 2016-02-26 MED ORDER — AMLODIPINE BESYLATE 5 MG PO TABS: 5.0000 mg | ORAL_TABLET | Freq: Every day | ORAL | 1 refills | Status: DC

## 2016-02-26 MED ORDER — SILDENAFIL CITRATE 20 MG PO TABS: 20.0000 mg | ORAL_TABLET | ORAL | 1 refills | Status: DC | PRN

## 2016-02-26 MED ORDER — BACITRACIN-NEOMYCIN-POLYMYXIN OINTMENT TUBE: 1.0000 "application " | TOPICAL_OINTMENT | Freq: Every day | CUTANEOUS | Status: DC

## 2016-02-26 MED ORDER — TRAMADOL HCL 50 MG PO TABS: 50.0000 mg | ORAL_TABLET | Freq: Two times a day (BID) | ORAL | 0 refills | Status: DC | PRN

## 2016-02-26 MED ORDER — OMEGA-3-ACID ETHYL ESTERS 1 G PO CAPS: 1.0000 g | ORAL_CAPSULE | Freq: Two times a day (BID) | ORAL | Status: AC

## 2016-02-26 MED ORDER — ATORVASTATIN CALCIUM 40 MG PO TABS: 40.0000 mg | ORAL_TABLET | Freq: Every day | ORAL | 1 refills | Status: AC

## 2016-02-26 MED ORDER — ASPIRIN 325 MG PO TBEC
325.0000 mg | DELAYED_RELEASE_TABLET | Freq: Every day | ORAL | 0 refills | Status: DC
Start: 2016-02-26 — End: 2018-12-29

## 2016-02-26 MED ORDER — AMIODARONE HCL 200 MG PO TABS: 200.0000 mg | ORAL_TABLET | Freq: Two times a day (BID) | ORAL | 1 refills | Status: DC

## 2016-02-26 NOTE — Evaluation (Signed)
Clinical/Bedside Swallow Evaluation Patient Details  Name: James Clayton MRN: [id number] Date of Birth: 17-Jun-1945  Today's Date: 02/26/2016 Time: SLP Start Time (ACUTE ONLY): 0902 SLP Stop Time (ACUTE ONLY): 0915 SLP Time Calculation (min) (ACUTE ONLY): 13 min  Past Medical History:  Past Medical History:  Diagnosis Date  . CAD (coronary artery disease)    a. LHC 02/12/16: ostLM-LM 90% severely calcified, ost-mLAD 90%, ostRamus-Ramus 80%, ostLCx-pLCx 90%, ostRCA-pRCA 100% severely calcified, LVEDP severely elevated; rec CABG  . Chronic diastolic CHF (congestive heart failure) (HCC)    a. echo 02/10/16: EF 50-55%, mild inf HK, GR2DD, mild to mod MR, PASP 46 mmHg  . CKD (chronic kidney disease), stage III   . Diabetes mellitus with complication (HCC)   . HLD (hyperlipidemia)   . Hypertension    Past Surgical History:  Past Surgical History:  Procedure Laterality Date  . CARDIAC CATHETERIZATION N/A 02/12/2016   Procedure: Left Heart Cath and Coronary Angiography;  Surgeon: James OuchMuhammad A Arida, MD;  Location: [facility] INVASIVE CV LAB;  Service: Cardiovascular;  Laterality: N/A;  . CORONARY ARTERY BYPASS GRAFT N/A 02/13/2016   Procedure: CORONARY ARTERY BYPASS GRAFTING (CABG)x 3 with endoscopic harvesting of right saphenous  vein LIMA to LAD SVG to Ramus SVG to PDA;  Surgeon: James PernaPeter Van Trigt, MD;  [facility];  Service: Open Heart Surgery;  Laterality: N/A;  . JOINT REPLACEMENT    . TEE WITHOUT CARDIOVERSION N/A 02/13/2016   Procedure: TRANSESOPHAGEAL ECHOCARDIOGRAM (TEE);  Surgeon: James PernaPeter Van Trigt, MD;  [facility];  Service: Open Heart Surgery;  Laterality: N/A;   HPI:  Bedside swallow evaluation completed on patient who was admitted for a CABG x3 with no history of dysphagia or esophageal issues.     Assessment / Plan / Recommendation Clinical Impression  Bedside swallow evaluation complete.  Patient presents with an unremarkable oral motor exam, efficient mastication and oral  clearance of regular textures, as well as swift swallow initiation with no observable s/s of aspiration.  Recommend diet advancement to regular texture with no restrictions.  No SLP follow up warranted.      Aspiration Risk  No limitations    Diet Recommendation Regular;Thin liquid   Liquid Administration via: Cup Supervision: Patient able to self feed Postural Changes: Seated upright at 90 degrees    Other  Recommendations Oral Care Recommendations: Oral care BID   Follow up Recommendations None                          Swallow Study   General HPI: Bedsice swallow evaluation completed on patient who was admitted for a CABG x3 with no history of dysphagia or esophageal issues.   Type of Study: Bedside Swallow Evaluation Previous Swallow Assessment: none on record  Diet Prior to this Study: Dysphagia 3 (soft);Thin liquids Temperature Spikes Noted: No Respiratory Status: Room air History of Recent Intubation: No Behavior/Cognition: Alert;Pleasant mood;Cooperative Oral Cavity Assessment: Within Functional Limits Oral Cavity - Dentition: Dentures, top Vision: Functional for self-feeding Self-Feeding Abilities: Able to feed self Patient Positioning: Other (comment) (upright edge of bed ) Baseline Vocal Quality: Normal Volitional Cough: Strong Volitional Swallow: Able to elicit    Oral/Motor/Sensory Function Overall Oral Motor/Sensory Function: Within functional limits   Ice Chips Ice chips: Not tested   Thin Liquid Thin Liquid: Within functional limits Presentation: Cup    Nectar Thick Nectar Thick Liquid: Not tested   Honey Thick Honey Thick Liquid: Not tested   Puree  Puree: Not tested   Solid   GO   Solid: Within functional limits Presentation: Self James BastosFed       James Clayton, M.A., CCC-SLP [phone]  James Clayton 02/26/2016,9:24 AM

## 2016-02-26 NOTE — Progress Notes (Addendum)
      [address]       [place] [id number]             [phone]        13 Days Post-Op Procedure(s) (LRB): CORONARY ARTERY BYPASS GRAFTING (CABG)x 3 with endoscopic harvesting of right saphenous  vein LIMA to LAD SVG to Ramus SVG to PDA (N/A) TRANSESOPHAGEAL ECHOCARDIOGRAM (TEE) (N/A)  Subjective: Patient states he urinates better when lying down than when standing up. Otherwise, no complaints and wants to go home.  Objective: Vital signs in last 24 hours: Temp:  [97.3 F (36.3 C)-98.4 F (36.9 C)] 98.4 F (36.9 C) (12/11 0509) Pulse Rate:  [72-75] 75 (12/11 0509) Cardiac Rhythm: Normal sinus rhythm (12/10 1922) Resp:  [18] 18 (12/11 0509) BP: (129-140)/(62-68) 133/62 (12/11 0509) SpO2:  [95 %-100 %] 95 % (12/11 0509) Weight:  [234 lb 8 oz (106.4 kg)] 234 lb 8 oz (106.4 kg) (12/11 0509)  Pre op weight 111 kg Current Weight  02/26/16 234 lb 8 oz (106.4 kg)   Intake/Output from previous day: 12/10 0701 - 12/11 0700 In: 760 [P.O.:720; I.V.:40] Out: 1650 [Urine:1650]   Physical Exam:  Cardiovascular: RRR Pulmonary: Mostly clear to auscultation bilaterally Abdomen: Soft, non tender, bowel sounds present. Extremities: Trace  bilateral lower extremity edema. Wounds: Clean and dry.  No erythema or signs of infection.  Lab Results: CBC:No results for input(s): WBC, HGB, HCT, PLT in the last 72 hours. BMET:  Recent Labs  02/25/16 0402 02/26/16 0454  NA 138 138  K 4.3 4.5  CL 100* 102  CO2 27 27  GLUCOSE 128* 119*  BUN 39* 41*  CREATININE 2.28* 2.32*  CALCIUM 8.8* 9.1    PT/INR:  Lab Results  Component Value Date   INR 1.33 02/13/2016   INR 1.07 02/13/2016   INR 1.10 02/12/2016   ABG:  INR: Will add last result for INR, ABG once components are confirmed Will add last 4 CBG results once components are confirmed  Assessment/Plan:  1. CV - S?p NSTEMI. SR in the 90's. On Amiodarone 200 mg bid, Norvasc 5 mg daily, and Coreg 25 mg  bid. 2.  Pulmonary - On room air. Encourage incentive spirometer. 3. CKD  (stage III)-Elevated creatinine post op.His creatinine was 2.28 yesterday and is 2.32 this am. His creatinine upon admission was 1.95. 4.  Acute blood loss anemia - Last H and H 10.4 and 32.1 5. DM-CBGs 134/111/128. On Glipizide 2.5 mg daily. Pre op HGA1C 6.9. 6. Remove PICC line 7. Will ask speech to assess swallowing since on dysphagia 3 diet 8. Regarding urination, on Hytrin. I instructed him to obtain urologist for follow up after discharge. 9. Discharge home  Felica Chargois MPA-C 02/26/2016,7:21 AM

## 2016-02-26 NOTE — Care Management Note (Signed)
Case Management Note Previous CM note initiated by Magdalene RiverMayo, Henrietta T, RN 02/16/2016, 10:12 AM   Patient Details  Name: James ChimesJames Paladino MRN: [id number] Date of Birth: [birth year]  Subjective/Objective:  Pt lives with wife, daughter lives across the street and is a Engineer, civil (consulting)nurse, son lives nearby and is a Radiation protection practitionerparamedic.  Wife will provide 24/7 assistance, reports adult children are very supportive and will help as needed.  Pt has cane, walker, and BSC.                           Action/Plan: Pt s/p CABGx3- tx from 2S to 2W on 02/22/16-   Expected Discharge Date:   02/26/16               Expected Discharge Plan:  Home/Self Care  In-House Referral:     Discharge planning Services  CM Consult  Post Acute Care Choice:    Choice offered to:     DME Arranged:    DME Agency:     HH Arranged:    HH Agency:     Status of Service:  Completed, signed off  If discussed at [employer] Length of Stay Meetings, dates discussed:    Discharge Disposition:  Home/self care   Additional Comments:  02/26/16- 1000- Dallan Schonberg RN, CM- pt for d/c today- no CM needs noted to d/c home with family.   Zenda AlpersWebster, BronsonKristi Hall, RN 02/26/2016, 9:58 AM [phone]

## 2016-03-07 ENCOUNTER — Ambulatory Visit (INDEPENDENT_AMBULATORY_CARE_PROVIDER_SITE_OTHER): Payer: Medicare Other | Admitting: Nurse Practitioner

## 2016-03-07 ENCOUNTER — Encounter: Payer: Self-pay | Admitting: Nurse Practitioner

## 2016-03-07 VITALS — BP 134/68 | HR 69 | Ht 73.0 in | Wt 237.5 lb

## 2016-03-07 DIAGNOSIS — N183 Chronic kidney disease, stage 3 unspecified: Secondary | ICD-10-CM

## 2016-03-07 DIAGNOSIS — E782 Mixed hyperlipidemia: Secondary | ICD-10-CM

## 2016-03-07 DIAGNOSIS — I2510 Atherosclerotic heart disease of native coronary artery without angina pectoris: Secondary | ICD-10-CM | POA: Diagnosis not present

## 2016-03-07 DIAGNOSIS — I119 Hypertensive heart disease without heart failure: Secondary | ICD-10-CM

## 2016-03-07 DIAGNOSIS — I480 Paroxysmal atrial fibrillation: Secondary | ICD-10-CM

## 2016-03-07 DIAGNOSIS — I214 Non-ST elevation (NSTEMI) myocardial infarction: Secondary | ICD-10-CM | POA: Diagnosis not present

## 2016-03-07 MED ORDER — AMLODIPINE BESYLATE 10 MG PO TABS: 10.0000 mg | ORAL_TABLET | Freq: Every day | ORAL | 3 refills | Status: DC

## 2016-03-07 NOTE — Progress Notes (Signed)
Office Visit    Patient Name: James Clayton Date of Encounter: 03/07/2016  Primary Care Provider:  Patriciaann Clan DO Primary Cardiologist:  Judie Petit. Kirke Corin, MD   Chief Complaint    70 year old male status post recent non-STEMI and CABG 3, who presents for follow-up.  Past Medical History    Past Medical History:  Diagnosis Date  . CAD (coronary artery disease)    a. LHC 02/12/16: ostLM-LM 90% severely calcified, ost-mLAD 90%, ostRamus-Ramus 80%, ostLCx-pLCx 90%, ostRCA-pRCA 100% severely calcified, LVEDP severely elevated; b. 01/2016 CABG x 3 (LIMA->LAD, VG->PDA, VG->RI).   . Chronic diastolic CHF (congestive heart failure) (HCC)    a. echo 02/10/16: EF 50-55%, mild inf HK, GR2DD, mild to mod MR, PASP 46 mmHg  . CKD (chronic kidney disease), stage III   . Diabetes mellitus with complication (HCC)   . HLD (hyperlipidemia)   . Hypertensive heart disease   . Ileus (HCC)    a. 02/2016 following CABG.  . Morbid obesity (HCC)   . Post-operative Atrial Fibrillation    a. 01/2016 following CABG.   Past Surgical History:  Procedure Laterality Date  . CARDIAC CATHETERIZATION N/A 02/12/2016   Procedure: Left Heart Cath and Coronary Angiography;  Surgeon: [name], MD;  Location: [facility] INVASIVE CV LAB;  Service: Cardiovascular;  Laterality: N/A;  . CORONARY ARTERY BYPASS GRAFT N/A 02/13/2016   Procedure: CORONARY ARTERY BYPASS GRAFTING (CABG)x 3 with endoscopic harvesting of right saphenous  vein LIMA to LAD SVG to Ramus SVG to PDA;  Surgeon: [name], MD;  Location: [facility] OR;  Service: Open Heart Surgery;  Laterality: N/A;  . JOINT REPLACEMENT    . TEE WITHOUT CARDIOVERSION N/A 02/13/2016   Procedure: TRANSESOPHAGEAL ECHOCARDIOGRAM (TEE);  Surgeon: [name], MD;  Location: [facility] OR;  Service: Open Heart Surgery;  Laterality: N/A;    Allergies  No Known Allergies  History of Present Illness    70 year old male with the above complex past medical history including  hypertension, hyperlipidemia, diabetes, stage III kidney disease, and morbid obesity. He was recently admitted to [facility] with non-STEMI and underwent diagnostic catheterization revealing severe multivessel coronary artery disease. Echo showed normal LV function with diastolic dysfunction. He was transferred to [facility] for surgical evaluation. He subsequently underwent coronary artery bypass grafting 3. Postoperative course was complicated by paroxysmal atrial fibrillation, which was successfully treated with amiodarone. He also developed an ileus with subsequent resolution. Of note, creatinine rose slightly throughout admission and he was discharged with a creatinine of 2.32.  Since discharge, he has done well. He has some chest wall tenderness but otherwise denies dyspnea, chest pressure/tightness, PND, orthopnea, dizziness, syncope, edema, or early satiety. His chest wall, abdominal, and right lower extremity wounds are healing well. Blood pressures been running in the 130s to 140s at home.  Current Medications   Prior to Admission medications   Medication Sig Start Date End Date Taking? Authorizing Provider  allopurinol (ZYLOPRIM) 100 MG tablet Take 100 mg by mouth 2 (two) times daily.  01/22/16  Yes Historical Provider, MD  amiodarone (PACERONE) 200 MG tablet Take 1 tablet (200 mg total) by mouth 2 (two) times daily. For one week;then take Amiodarone 200 mg by mouth daily thereafter 02/26/16  Yes Donielle Margaretann Loveless, PA-C  amLODipine (NORVASC) 5 MG tablet Take 1 tablet (5 mg total) by mouth daily. 02/26/16  Yes Donielle Margaretann Loveless, PA-C  aspirin EC 325 MG EC tablet Take 1 tablet (325 mg total) by mouth daily.  02/26/16  Yes Donielle Margaretann LovelessM Zimmerman, PA-C  atorvastatin (LIPITOR) 40 MG tablet Take 1 tablet (40 mg total) by mouth daily. 02/26/16  Yes Donielle Margaretann LovelessM Zimmerman, PA-C  carvedilol (COREG) 25 MG tablet Take 25 mg by mouth daily.  01/22/16  Yes Historical Provider, MD  glipiZIDE  (GLUCOTROL XL) 2.5 MG 24 hr tablet Take 2.5 mg by mouth daily. 01/22/16  Yes Historical Provider, MD  neomycin-bacitracin-polymyxin (NEOSPORIN) OINT Apply 1 application topically daily. 02/26/16  Yes Donielle Margaretann LovelessM Zimmerman, PA-C  omega-3 acid ethyl esters (LOVAZA) 1 g capsule Take 1 capsule (1 g total) by mouth 2 (two) times daily. 02/26/16  Yes Donielle Margaretann LovelessM Zimmerman, PA-C  sildenafil (REVATIO) 20 MG tablet Take 1-2 tablets (20-40 mg total) by mouth as needed. Please discuss with cardiologist when to resume 02/26/16  Yes Donielle Margaretann LovelessM Zimmerman, PA-C  terazosin (HYTRIN) 5 MG capsule Take 5 mg by mouth at bedtime.  01/22/16  Yes Historical Provider, MD  traMADol (ULTRAM) 50 MG tablet Take 1 tablet (50 mg total) by mouth every 12 (twelve) hours as needed for moderate pain. 02/26/16  Yes Donielle Margaretann LovelessM Zimmerman, PA-C    Review of Systems   As above, doing well without angina, dyspnea, PND, orthopnea, dizziness, syncope, edema, or early satiety. Wounds are healing well. He does have some chest wall tenderness related to surgery.  All other systems reviewed and are otherwise negative except as noted above.  Physical Exam    VS:  BP 134/68 (BP Location: Right Arm, Patient Position: Sitting, Cuff Size: Normal)   Pulse 69   Ht 6\' 1"  (1.854 m)   Wt 237 lb 8 oz (107.7 kg)   BMI 31.33 kg/m  , BMI Body mass index is 31.33 kg/m. GEN: Well nourished, well developed, in no acute distress.  HEENT: normal.  Neck: Supple, no JVD, carotid bruits, or masses. Cardiac: RRR, no murmurs, rubs, or gallops. No clubbing, cyanosis, edema.  Sternal and right lower extremity surgical sites are healing well without heat, erythema, or drainage.  Respiratory:  Respirations regular and unlabored, clear to auscultation bilaterally. GI: Soft, nontender, nondistended, BS + x 4. Abdominal surgical drain sites are healing well without heat, erythema, or drainage. MS: no deformity or atrophy. Skin: warm and dry, no rash. Neuro:  Strength  and sensation are intact. Psych: Normal affect.  Accessory Clinical Findings    ECG - Regular sinus rhythm, 69, nonspecific T-wave flattening-no acute changes.  Assessment & Plan    1.  Coronary artery disease status post coronary artery bypass grafting/non-ST segment elevation myocardial infarction, subsequent episode of care: Patient was recently admitted with non-STEMI and underwent catheterization revealing severe coronary disease. He subsequently underwent CABG 3. From a cardiac standpoint, he has been doing well with minimal chest wall pain and no angina. He has not had any dyspnea. Surgical sites are healing well. He remains on aspirin, statin, beta blocker therapy. [facility] is [facility] once cleared by surgery.  2. Postoperative atrial fibrillation: He is in sinus rhythm today. He denies any palpitations. He remains on amiodarone and tolerating this well.  3. Hypertensive heart disease: Blood pressure is elevated in the 130s to 140s both here and also at home. I will titrate his amlodipine to 10 mg daily.  4. Type 2 diabetes mellitus: This is followed by his primary care provider. A1c was 6.9 on November 28.  5. Morbid obesity: We did discuss the role that cardiac rehabilitation plays in recovery and he is strongly  considering enrollment once cleared. He was certainly benefit from dietary and nutrition counseling that is provided at cardiac rehabilitation.  6. Stage III chronic kidney disease: He did have some worsening of renal function during his hospitalization. I will follow-up a basic metabolic panel today.  7. Hyperlipidemia: LDL was 37 on November 28. Continue statin therapy.  8. Disposition: Follow-up basic metabolic panel today. Plan to follow-up with Dr. Kirke CorinArida in 2-3 months or sooner if necessary.   Nicolasa Duckinghristopher Krisalyn Yankowski, NP 03/07/2016, 3:22 PM

## 2016-03-07 NOTE — Patient Instructions (Signed)
Medication Instructions:   Please start Amlodipine 10 mg tablet daily.   Labwork:  B MET   Testing/Procedures: None  Follow-Up with Dr. Arida in 2-3 months  Any Other Special Instructions Will Be Listed Below (If Applicable).     If you need a refill on your cardiac medications before your next appointment, please call your pharmacy.   

## 2016-03-08 LAB — BASIC METABOLIC PANEL
BUN / CREAT RATIO: 16 (ref 10–24)
BUN: 27 mg/dL (ref 8–27)
CO2: 21 mmol/L (ref 18–29)
CREATININE: 1.65 mg/dL — AB (ref 0.76–1.27)
Calcium: 9.2 mg/dL (ref 8.6–10.2)
Chloride: 105 mmol/L (ref 96–106)
GFR calc Af Amer: 48 mL/min/{1.73_m2} — ABNORMAL LOW (ref >59)
GFR calc non Af Amer: 41 mL/min/{1.73_m2} — ABNORMAL LOW (ref >59)
GLUCOSE: 88 mg/dL (ref 65–99)
Potassium: 4.9 mmol/L (ref 3.5–5.2)
Sodium: 142 mmol/L (ref 134–144)

## 2016-03-13 ENCOUNTER — Encounter: Payer: Medicare Other | Admitting: Physician Assistant

## 2016-03-19 ENCOUNTER — Other Ambulatory Visit: Payer: Self-pay | Admitting: Cardiothoracic Surgery

## 2016-03-19 DIAGNOSIS — Z951 Presence of aortocoronary bypass graft: Secondary | ICD-10-CM

## 2016-03-20 ENCOUNTER — Ambulatory Visit (INDEPENDENT_AMBULATORY_CARE_PROVIDER_SITE_OTHER): Payer: Self-pay | Admitting: Cardiothoracic Surgery

## 2016-03-20 ENCOUNTER — Ambulatory Visit
Admission: RE | Admit: 2016-03-20 | Discharge: 2016-03-20 | Disposition: A | Discharge status: Home / Self Care | Payer: Medicare Other | Source: Ambulatory Visit | Attending: Cardiothoracic Surgery | Admitting: Cardiothoracic Surgery

## 2016-03-20 ENCOUNTER — Encounter: Payer: Self-pay | Admitting: Cardiothoracic Surgery

## 2016-03-20 VITALS — BP 138/83 | HR 84 | Resp 16 | Ht 73.0 in | Wt 237.0 lb

## 2016-03-20 DIAGNOSIS — Z951 Presence of aortocoronary bypass graft: Secondary | ICD-10-CM

## 2016-03-20 DIAGNOSIS — I2510 Atherosclerotic heart disease of native coronary artery without angina pectoris: Secondary | ICD-10-CM

## 2016-03-20 NOTE — Progress Notes (Signed)
PCP is Patriciaann ClanBUTLER,[name], DO Referring Provider is Iran OuchArida, Muhammad A, MD  Chief Complaint  Patient presents with  . Routine Post Op    s/p CABG X 3..11/28 /17 with a CXR    HPI: Postop visit after urgent CABG 3 for severe three-vessel coronary disease and unstable angina and diastolic heart failure Patient did well but developed transient atrial fibrillation. He also had a postoperative colonic ileus treated with TPN and observation. Since discharge the patient has done well. He has maintained sinus rhythm. He has no GI complaints. He denies recurrent angina. Surgical incisions are healing well. The patient has been contacted by his local hospital[facility] for outpatient cardiac rehabilitation which will start in 10 days The patient has been kept inside due to the weather but is trying to walk daily.  Past Medical History:  Diagnosis Date  . CAD (coronary artery disease)    a. LHC 02/12/16: ostLM-LM 90% severely calcified, ost-mLAD 90%, ostRamus-Ramus 80%, ostLCx-pLCx 90%, ostRCA-pRCA 100% severely calcified, LVEDP severely elevated; b. 01/2016 CABG x 3 (LIMA->LAD, VG->PDA, VG->RI).   . Chronic diastolic CHF (congestive heart failure) (HCC)    a. echo 02/10/16: EF 50-55%, mild inf HK, GR2DD, mild to mod MR, PASP 46 mmHg  . CKD (chronic kidney disease), stage III   . Diabetes mellitus with complication (HCC)   . HLD (hyperlipidemia)   . Hypertensive heart disease   . Ileus (HCC)    a. 02/2016 following CABG.  . Morbid obesity (HCC)   . Post-operative Atrial Fibrillation    a. 01/2016 following CABG.    Past Surgical History:  Procedure Laterality Date  . CARDIAC CATHETERIZATION N/A 02/12/2016   Procedure: Left Heart Cath and Coronary Angiography;  Surgeon: Iran OuchMuhammad A Arida, MD;  Location: [facility] INVASIVE CV LAB;  Service: Cardiovascular;  Laterality: N/A;  . CORONARY ARTERY BYPASS GRAFT N/A 02/13/2016   Procedure: CORONARY ARTERY BYPASS GRAFTING (CABG)x 3 with  endoscopic harvesting of right saphenous  vein LIMA to LAD SVG to Ramus SVG to PDA;  Surgeon: Kerin PernaPeter Van Trigt, MD;  [facility];  Service: Open Heart Surgery;  Laterality: N/A;  . JOINT REPLACEMENT    . TEE WITHOUT CARDIOVERSION N/A 02/13/2016   Procedure: TRANSESOPHAGEAL ECHOCARDIOGRAM (TEE);  Surgeon: Kerin PernaPeter Van Trigt, MD;  [facility];  Service: Open Heart Surgery;  Laterality: N/A;    Family History  Problem Relation Age of Onset  . Cancer Mother   . Heart disease Father     Social History Social History  Substance Use Topics  . Smoking status: Former Games developermoker  . Smokeless tobacco: Never Used  . Alcohol use No    Current Outpatient Prescriptions  Medication Sig Dispense Refill  . allopurinol (ZYLOPRIM) 100 MG tablet Take 100 mg by mouth 2 (two) times daily.   12  . amiodarone (PACERONE) 200 MG tablet Take 1 tablet (200 mg total) by mouth 2 (two) times daily. For one week;then take Amiodarone 200 mg by mouth daily thereafter 60 tablet 1  . amLODipine (NORVASC) 10 MG tablet Take 1 tablet (10 mg total) by mouth daily. 30 tablet 3  . aspirin EC 325 MG EC tablet Take 1 tablet (325 mg total) by mouth daily. 30 tablet 0  . atorvastatin (LIPITOR) 40 MG tablet Take 1 tablet (40 mg total) by mouth daily. 30 tablet 1  . carvedilol (COREG) 25 MG tablet Take 25 mg by mouth daily.   12  . glipiZIDE (GLUCOTROL XL) 2.5 MG 24 hr tablet Take  2.5 mg by mouth daily.  11  . neomycin-bacitracin-polymyxin (NEOSPORIN) OINT Apply 1 application topically daily.    Marland Kitchen omega-3 acid ethyl esters (LOVAZA) 1 g capsule Take 1 capsule (1 g total) by mouth 2 (two) times daily.    . sildenafil (REVATIO) 20 MG tablet Take 1-2 tablets (20-40 mg total) by mouth as needed. Please discuss with cardiologist when to resume 10 tablet 1  . terazosin (HYTRIN) 5 MG capsule Take 5 mg by mouth at bedtime.   5  . traMADol (ULTRAM) 50 MG tablet Take 1 tablet (50 mg total) by mouth every 12 (twelve) hours as needed for  moderate pain. 28 tablet 0   No current facility-administered medications for this visit.     No Known Allergies  Review of Systems  Improved appetite and overall strength No ankle edema No fever No weight loss  BP 138/83 (BP Location: Left Arm, Patient Position: Sitting, Cuff Size: Large)   Pulse 84   Resp 16   Ht 6\' 1"  (1.854 m)   Wt 237 lb (107.5 kg)   SpO2 98% Comment: ON RA  BMI 31.27 kg/m  Physical Exam      Exam    General- alert and comfortable   Lungs- clear without rales, wheezes   Cor- regular rate and rhythm, no murmur , gallop   Abdomen- soft, non-tender   Extremities - warm, non-tender, minimal edema   Neuro- oriented, appropriate, no focal weakness  Diagnostic Tests: Chest x-rays clear  Impression: Doing well 5 weeks postop. He is approximately 50% recovered. He may resume driving, normal activities and lift up to 20 pounds maximum Until after 05/16/2016 after which time he will have no restrictions. We will stop his amiodarone as he has maintained sinus rhythm. He understands the importance of heart healthy lifestyle he will continue aspirin and Crestor.   Plan:   Mikey Bussing, MD [facility] [phone]

## 2016-05-03 ENCOUNTER — Telehealth: Payer: Self-pay

## 2016-05-03 NOTE — Telephone Encounter (Signed)
Mr James Clayton had CABG 02/13/16, was [facility]. Post op appt with Dr Donata ClayVan Trigt 03/20/16 and was released to Cardiology. Mr James Clayton has on appointment with Dr [place]ke [place]ida [place] on 05/17/2016, he is also being followed by his PCP. Mr James Clayton is currently in Cardiac rehab and states that his is doing well with no readmissions to any hospital

## 2016-05-17 ENCOUNTER — Encounter: Payer: Self-pay | Admitting: Cardiovascular Disease

## 2016-05-17 ENCOUNTER — Ambulatory Visit (INDEPENDENT_AMBULATORY_CARE_PROVIDER_SITE_OTHER): Payer: Medicare Other | Admitting: Cardiovascular Disease

## 2016-05-17 VITALS — BP 128/68 | HR 63 | Ht 73.0 in | Wt 240.5 lb

## 2016-05-17 DIAGNOSIS — I10 Essential (primary) hypertension: Secondary | ICD-10-CM

## 2016-05-17 DIAGNOSIS — I2510 Atherosclerotic heart disease of native coronary artery without angina pectoris: Secondary | ICD-10-CM | POA: Diagnosis not present

## 2016-05-17 DIAGNOSIS — E785 Hyperlipidemia, unspecified: Secondary | ICD-10-CM | POA: Diagnosis not present

## 2016-05-17 NOTE — Patient Instructions (Signed)
Medication Instructions: Continue same medications.   Labwork: None.   Procedures/Testing: None.   Follow-Up: 6 months with Dr. Arida.   Any Additional Special Instructions Will Be Listed Below (If Applicable).     If you need a refill on your cardiac medications before your next appointment, please call your pharmacy.   

## 2016-05-17 NOTE — Progress Notes (Signed)
Cardiology Office Note   Date:  05/17/2016   ID:  James Clayton, DOB 05-12-1945, MRN [id number]  PCP:  Patriciaann Clan DO  Cardiologist:   [name], MD   Chief Complaint  Patient presents with  . OTHER    2-3 month f/u per [facility] c/o muscular chest pain. Meds reviewed verbally with pt.      History of Present Illness: James Clayton is a 71 y.o. male who presents for a follow-up visit regarding coronary artery disease. He has chronic medical conditions that include hypertension, hyperlipidemia, type 2 diabetes, stage III chronic kidney disease and obesity. He presented in November with non-ST elevation myocardial infarction. Cardiac catheterization showed severe three-vessel and left main coronary artery disease. Echo showed normal LV systolic function. The patient underwent CABG at [facility]. He had postoperative atrial fibrillation treated successfully with amiodarone. He has been doing extremely well and has been attending cardiac rehabilitation with no anginal symptoms. Blood pressure is controlled on current medications. Renal function improved with a most recent creatinine of 1.65.    Past Medical History:  Diagnosis Date  . CAD (coronary artery disease)    a. LHC 02/12/16: ostLM-LM 90% severely calcified, ost-mLAD 90%, ostRamus-Ramus 80%, ostLCx-pLCx 90%, ostRCA-pRCA 100% severely calcified, LVEDP severely elevated; b. 01/2016 CABG x 3 (LIMA->LAD, VG->PDA, VG->RI).   . Chronic diastolic CHF (congestive heart failure) (HCC)    a. echo 02/10/16: EF 50-55%, mild inf HK, GR2DD, mild to mod MR, PASP 46 mmHg  . CKD (chronic kidney disease), stage III   . Diabetes mellitus with complication (HCC)   . HLD (hyperlipidemia)   . Hypertensive heart disease   . Ileus (HCC)    a. 02/2016 following CABG.  . Morbid obesity (HCC)   . Post-operative Atrial Fibrillation    a. 01/2016 following CABG.    Past Surgical History:  Procedure Laterality Date  . CARDIAC CATHETERIZATION N/A  02/12/2016   Procedure: Left Heart Cath and Coronary Angiography;  Surgeon: [name], MD;  Location: [facility] INVASIVE CV LAB;  Service: Cardiovascular;  Laterality: N/A;  . CORONARY ARTERY BYPASS GRAFT N/A 02/13/2016   Procedure: CORONARY ARTERY BYPASS GRAFTING (CABG)x 3 with endoscopic harvesting of right saphenous  vein LIMA to LAD SVG to Ramus SVG to PDA;  Surgeon: [name], MD;  Location: [facility] OR;  Service: Open Heart Surgery;  Laterality: N/A;  . JOINT REPLACEMENT    . TEE WITHOUT CARDIOVERSION N/A 02/13/2016   Procedure: TRANSESOPHAGEAL ECHOCARDIOGRAM (TEE);  Surgeon: [name], MD;  Location: [facility] OR;  Service: Open Heart Surgery;  Laterality: N/A;     Current Outpatient Prescriptions  Medication Sig Dispense Refill  . allopurinol (ZYLOPRIM) 100 MG tablet Take 100 mg by mouth 2 (two) times daily.   12  . amLODipine (NORVASC) 10 MG tablet Take 1 tablet (10 mg total) by mouth daily. 30 tablet 3  . aspirin EC 325 MG EC tablet Take 1 tablet (325 mg total) by mouth daily. 30 tablet 0  . atorvastatin (LIPITOR) 40 MG tablet Take 1 tablet (40 mg total) by mouth daily. 30 tablet 1  . carvedilol (COREG) 25 MG tablet Take 25 mg by mouth daily.   12  . glipiZIDE (GLUCOTROL XL) 2.5 MG 24 hr tablet Take 2.5 mg by mouth daily.  11  . omega-3 acid ethyl esters (LOVAZA) 1 g capsule Take 1 capsule (1 g total) by mouth 2 (two) times daily.    Marland Kitchen terazosin (HYTRIN) 5 MG capsule Take 5  mg by mouth at bedtime.   5   No current facility-administered medications for this visit.     Allergies:   Patient has no known allergies.    Social History:  The patient  reports that he has quit smoking. He has never used smokeless tobacco. He reports that he does not drink alcohol or use drugs.   Family History:  The patient's family history includes Cancer in his mother; Heart disease in his father.    ROS:  Please see the history of present illness.   Otherwise, review of systems are positive for  none.   All other systems are reviewed and negative.    PHYSICAL EXAM: VS:  BP 128/68 (BP Location: Left Arm, Patient Position: Sitting, Cuff Size: Normal)   Pulse 63   Ht 6\' 1"  (1.854 m)   Wt 240 lb 8 oz (109.1 kg)   BMI 31.73 kg/m  , BMI Body mass index is 31.73 kg/m. GEN: Well nourished, well developed, in no acute distress  HEENT: normal  Neck: no JVD, carotid bruits, or masses Cardiac: RRR; no  rubs, or gallops. One out of 6 systolic ejection murmur at the base. There is mild bilateral leg edema Respiratory:  clear to auscultation bilaterally, normal work of breathing GI: soft, nontender, nondistended, + BS MS: no deformity or atrophy  Skin: warm and dry, no rash Neuro:  Strength and sensation are intact Psych: euthymic mood, full affect   EKG:  EKG is ordered today. The ekg ordered today demonstrates normal sinus rhythm with nonspecific T wave changes.   Recent Labs: 02/13/2016: TSH 1.653 02/22/2016: Magnesium 2.1 02/23/2016: ALT 29; Hemoglobin 10.4; Platelets 253 03/07/2016: BUN 27; Creatinine, Ser 1.65; Potassium 4.9; Sodium 142    Lipid Panel    Component Value Date/Time   CHOL 105 02/13/2016 0508   TRIG 173 (H) 02/20/2016 0440   HDL 21 (L) 02/13/2016 0508   CHOLHDL 5.0 02/13/2016 0508   VLDL 47 (H) 02/13/2016 0508   LDLCALC 37 02/13/2016 0508      Wt Readings from Last 3 Encounters:  05/17/16 240 lb 8 oz (109.1 kg)  03/20/16 237 lb (107.5 kg)  03/07/16 237 lb 8 oz (107.7 kg)       PAD Screen 03/07/2016  Previous PAD dx? No  Previous surgical procedure? No  Pain with walking? Yes  Subsides with rest? Yes  Feet/toe relief with dangling? No  Painful, non-healing ulcers? No  Extremities discolored? No      ASSESSMENT AND PLAN:  1.  Coronary artery disease involving native coronary arteries without angina: He is doing extremely well with no anginal symptoms. Continue medical therapy and cardiac rehabilitation.  2. Essential hypertension: Blood  pressure is well controlled on current medications. He does have mild bilateral leg edema which is likely due to chronic venous insufficiency and amlodipine. The dose can be decreased to 5 mg daily if needed. I made no changes today.  3. Hyperlipidemia: Continue treatment with atorvastatin 40 mg daily. Most recent LDL 137.  4. Obesity: We discussed the importance of healthy diet and regular exercise.    Disposition:   FU with me in 6 months  Signed,  Lorine BearsMuhammad Arida, MD  05/17/2016 11:10 AM    [facility]

## 2016-05-22 NOTE — Addendum Note (Signed)
Addended by: Kendrick FriesLOPEZ, Virda Betters C on: 05/22/2016 08:24 AM   Modules accepted: Orders

## 2016-06-06 ENCOUNTER — Other Ambulatory Visit: Payer: Self-pay | Admitting: Nurse Practitioner

## 2016-06-06 MED ORDER — AMLODIPINE BESYLATE 10 MG PO TABS: 10.0000 mg | ORAL_TABLET | Freq: Every day | ORAL | 0 refills | Status: DC

## 2016-07-12 DIAGNOSIS — I25810 Atherosclerosis of coronary artery bypass graft(s) without angina pectoris: Secondary | ICD-10-CM | POA: Insufficient documentation

## 2016-07-12 DIAGNOSIS — I2510 Atherosclerotic heart disease of native coronary artery without angina pectoris: Secondary | ICD-10-CM | POA: Insufficient documentation

## 2016-07-12 DIAGNOSIS — N183 Chronic kidney disease, stage 3 unspecified: Secondary | ICD-10-CM | POA: Insufficient documentation

## 2016-09-08 DIAGNOSIS — Z86010 Personal history of colonic polyps: Secondary | ICD-10-CM | POA: Insufficient documentation

## 2016-09-11 ENCOUNTER — Telehealth: Payer: Self-pay | Admitting: Cardiovascular Disease

## 2016-09-11 NOTE — Telephone Encounter (Signed)
Clearance request from [facility] for colonoscopy, date TBD, received and placed in Dr. Jari SportsmanArida's basket

## 2016-09-16 NOTE — Telephone Encounter (Signed)
Faxed clearance to [facility] [phone] 

## 2016-11-12 ENCOUNTER — Ambulatory Visit (INDEPENDENT_AMBULATORY_CARE_PROVIDER_SITE_OTHER): Payer: Medicare Other | Admitting: Cardiovascular Disease

## 2016-11-12 ENCOUNTER — Encounter: Payer: Self-pay | Admitting: Cardiovascular Disease

## 2016-11-12 VITALS — BP 110/60 | HR 72 | Ht 73.5 in | Wt 249.5 lb

## 2016-11-12 DIAGNOSIS — I10 Essential (primary) hypertension: Secondary | ICD-10-CM | POA: Diagnosis not present

## 2016-11-12 DIAGNOSIS — E785 Hyperlipidemia, unspecified: Secondary | ICD-10-CM

## 2016-11-12 DIAGNOSIS — I2510 Atherosclerotic heart disease of native coronary artery without angina pectoris: Secondary | ICD-10-CM | POA: Diagnosis not present

## 2016-11-12 NOTE — Patient Instructions (Signed)
Medication Instructions: Continue same medications.   Labwork: None.   Procedures/Testing: None.   Follow-Up: 1 year with Dr. Linell Shawn.   Any Additional Special Instructions Will Be Listed Below (If Applicable).     If you need a refill on your cardiac medications before your next appointment, please call your pharmacy.   

## 2016-11-12 NOTE — Progress Notes (Signed)
Cardiology Office Note   Date:  11/12/2016   ID:  James Clayton, DOB [birth year], MRN [id number]  PCP:  [name], MD  Cardiologist:   [name], MD   Chief Complaint  Patient presents with  . other    6 month f/u edema ankles. Pt did not bring meds/list with him today . Meds reviewed verbally with pt.      History of Present Illness: James Clayton is a 71 y.o. male who presents for a follow-up visit regarding coronary artery disease. He has chronic medical conditions that include hypertension, hyperlipidemia, type 2 diabetes, stage III chronic kidney disease and obesity. 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 Cardiac catheterization showed severe three-vessel and left main coronary artery disease. Echo showed normal LV systolic function.  He has been doing extremely well and denies any chest pain or shortness of breath. He plays golf for exercise with no exertional symptoms. He takes his medications regularly.    Past Medical History:  Diagnosis Date  . CAD (coronary artery disease)    a. LHC 02/12/16: ostLM-LM 90% severely calcified, ost-mLAD 90%, ostRamus-Ramus 80%, ostLCx-pLCx 90%, ostRCA-pRCA 100% severely calcified, LVEDP severely elevated; b. 01/2016 CABG x 3 (LIMA->LAD, VG->PDA, VG->RI).   . Chronic diastolic CHF (congestive heart failure) (HCC)    a. echo 02/10/16: EF 50-55%, mild inf HK, GR2DD, mild to mod MR, PASP 46 mmHg  . CKD (chronic kidney disease), stage III   . Diabetes mellitus with complication (HCC)   . HLD (hyperlipidemia)   . Hypertensive heart disease   . Ileus (HCC)    a. 02/2016 following CABG.  . Morbid obesity (HCC)   . Post-operative Atrial Fibrillation    a. 01/2016 following CABG.    Past Surgical History:  Procedure Laterality Date  . CARDIAC CATHETERIZATION N/A 02/12/2016   Procedure: Left Heart Cath and Coronary Angiography;  Surgeon: [name], MD;   Location: [facility] INVASIVE CV LAB;  Service: Cardiovascular;  Laterality: N/A;  . CORONARY ARTERY BYPASS GRAFT N/A 02/13/2016   Procedure: CORONARY ARTERY BYPASS GRAFTING (CABG)x 3 with endoscopic harvesting of right saphenous  vein LIMA to LAD SVG to Ramus SVG to PDA;  Surgeon: [name], MD;  Location: [facility] OR;  Service: Open Heart Surgery;  Laterality: N/A;  . JOINT REPLACEMENT    . TEE WITHOUT CARDIOVERSION N/A 02/13/2016   Procedure: TRANSESOPHAGEAL ECHOCARDIOGRAM (TEE);  Surgeon: [name], MD;  Location: [facility] OR;  Service: Open Heart Surgery;  Laterality: N/A;     Current Outpatient Prescriptions  Medication Sig Dispense Refill  . allopurinol (ZYLOPRIM) 100 MG tablet Take 100 mg by mouth 2 (two) times daily.   12  . amLODipine (NORVASC) 10 MG tablet Take 1 tablet (10 mg total) by mouth daily. 90 tablet 0  . aspirin EC 325 MG EC tablet Take 1 tablet (325 mg total) by mouth daily. 30 tablet 0  . atorvastatin (LIPITOR) 40 MG tablet Take 1 tablet (40 mg total) by mouth daily. 30 tablet 1  . carvedilol (COREG) 25 MG tablet Take 25 mg by mouth daily.   12  . glipiZIDE (GLUCOTROL XL) 2.5 MG 24 hr tablet Take 2.5 mg by mouth daily.  11  . losartan (COZAAR) 50 MG tablet Take 50 mg by mouth daily.    Marland Kitchen omega-3 acid ethyl esters (LOVAZA) 1 g capsule Take 1 capsule (1 g total) by mouth 2 (two) times daily.    Marland Kitchen  terazosin (HYTRIN) 5 MG capsule Take 5 mg by mouth at bedtime.   5   No current facility-administered medications for this visit.     Allergies:   Penicillin g    Social History:  The patient  reports that he has quit smoking. He has never used smokeless tobacco. He reports that he does not drink alcohol or use drugs.   Family History:  The patient's family history includes Cancer in his mother; Heart disease in his father.    ROS:  Please see the history of present illness.   Otherwise, review of systems are positive for none.   All other systems are reviewed and negative.     PHYSICAL EXAM: VS:  BP 110/60 (BP Location: Left Arm, Patient Position: Sitting, Cuff Size: Normal)   Pulse 72   Ht 6' 1.5" (1.867 m)   Wt 249 lb 8 oz (113.2 kg)   BMI 32.47 kg/m  , BMI Body mass index is 32.47 kg/m. GEN: Well nourished, well developed, in no acute distress  HEENT: normal  Neck: no JVD, carotid bruits, or masses Cardiac: RRR; no  rubs, or gallops. 2/ 6 systolic ejection murmur at the base. There is mild bilateral leg edema Respiratory:  clear to auscultation bilaterally, normal work of breathing GI: soft, nontender, nondistended, + BS MS: no deformity or atrophy  Skin: warm and dry, no rash Neuro:  Strength and sensation are intact Psych: euthymic mood, full affect   EKG:  EKG is ordered today. The ekg ordered today demonstrates normal sinus rhythm with nonspecific T wave changes.   Recent Labs: 02/13/2016: TSH 1.653 02/22/2016: Magnesium 2.1 02/23/2016: ALT 29; Hemoglobin 10.4; Platelets 253 03/07/2016: BUN 27; Creatinine, Ser 1.65; Potassium 4.9; Sodium 142    Lipid Panel    Component Value Date/Time   CHOL 105 02/13/2016 0508   TRIG 173 (H) 02/20/2016 0440   HDL 21 (L) 02/13/2016 0508   CHOLHDL 5.0 02/13/2016 0508   VLDL 47 (H) 02/13/2016 0508   LDLCALC 37 02/13/2016 0508      Wt Readings from Last 3 Encounters:  11/12/16 249 lb 8 oz (113.2 kg)  05/17/16 240 lb 8 oz (109.1 kg)  03/20/16 237 lb (107.5 kg)       PAD Screen 03/07/2016  Previous PAD dx? No  Previous surgical procedure? No  Pain with walking? Yes  Subsides with rest? Yes  Feet/toe relief with dangling? No  Painful, non-healing ulcers? No  Extremities discolored? No      ASSESSMENT AND PLAN:  1.  Coronary artery disease involving native coronary arteries without angina: He is doing extremely well with no anginal symptoms. Continue medical therapy.  2. Essential hypertension: Blood pressure is well controlled on current medications.  Stable leg edema on amlodipine. I  reviewed recent labs which showed stable kidney function with creatinine of 1.6.  3. Hyperlipidemia: Continue treatment with atorvastatin 40 mg daily. I reviewed recent lipid profile this month which showed a total cholesterol of 96, HDL of 27 and an LDL of 71.  4. Obesity: Continue with healthy lifestyle changes.    Disposition:   FU with me in 12 months  Signed,  [name], MD  11/12/2016 1:44 PM    [facility]

## 2016-12-26 ENCOUNTER — Encounter: Payer: Self-pay | Admitting: *Deleted

## 2016-12-27 ENCOUNTER — Ambulatory Visit: Payer: Medicare Other | Admitting: Anesthesiology

## 2016-12-27 ENCOUNTER — Encounter: Payer: Self-pay | Admitting: *Deleted

## 2016-12-27 ENCOUNTER — Encounter
Admission: RE | Disposition: A | Discharge status: Home / Self Care | Payer: Self-pay | Source: Ambulatory Visit | Attending: Unknown Physician Specialty

## 2016-12-27 ENCOUNTER — Ambulatory Visit
Admission: RE | Admit: 2016-12-27 | Discharge: 2016-12-27 | Disposition: A | Discharge status: Home / Self Care | Payer: Medicare Other | Source: Ambulatory Visit | Attending: Unknown Physician Specialty | Admitting: Unknown Physician Specialty

## 2016-12-27 DIAGNOSIS — Z9660 Presence of unspecified orthopedic joint implant: Secondary | ICD-10-CM | POA: Diagnosis not present

## 2016-12-27 DIAGNOSIS — Z7984 Long term (current) use of oral hypoglycemic drugs: Secondary | ICD-10-CM | POA: Diagnosis not present

## 2016-12-27 DIAGNOSIS — Z6832 Body mass index (BMI) 32.0-32.9, adult: Secondary | ICD-10-CM | POA: Diagnosis not present

## 2016-12-27 DIAGNOSIS — Z951 Presence of aortocoronary bypass graft: Secondary | ICD-10-CM | POA: Insufficient documentation

## 2016-12-27 DIAGNOSIS — I5032 Chronic diastolic (congestive) heart failure: Secondary | ICD-10-CM | POA: Diagnosis not present

## 2016-12-27 DIAGNOSIS — Z79899 Other long term (current) drug therapy: Secondary | ICD-10-CM | POA: Insufficient documentation

## 2016-12-27 DIAGNOSIS — Z7982 Long term (current) use of aspirin: Secondary | ICD-10-CM | POA: Diagnosis not present

## 2016-12-27 DIAGNOSIS — N183 Chronic kidney disease, stage 3 (moderate): Secondary | ICD-10-CM | POA: Diagnosis not present

## 2016-12-27 DIAGNOSIS — I130 Hypertensive heart and chronic kidney disease with heart failure and stage 1 through stage 4 chronic kidney disease, or unspecified chronic kidney disease: Secondary | ICD-10-CM | POA: Diagnosis not present

## 2016-12-27 DIAGNOSIS — I2510 Atherosclerotic heart disease of native coronary artery without angina pectoris: Secondary | ICD-10-CM | POA: Insufficient documentation

## 2016-12-27 DIAGNOSIS — E1122 Type 2 diabetes mellitus with diabetic chronic kidney disease: Secondary | ICD-10-CM | POA: Insufficient documentation

## 2016-12-27 DIAGNOSIS — Z8249 Family history of ischemic heart disease and other diseases of the circulatory system: Secondary | ICD-10-CM | POA: Insufficient documentation

## 2016-12-27 DIAGNOSIS — Z87891 Personal history of nicotine dependence: Secondary | ICD-10-CM | POA: Diagnosis not present

## 2016-12-27 DIAGNOSIS — Z809 Family history of malignant neoplasm, unspecified: Secondary | ICD-10-CM | POA: Diagnosis not present

## 2016-12-27 DIAGNOSIS — K5730 Diverticulosis of large intestine without perforation or abscess without bleeding: Secondary | ICD-10-CM | POA: Insufficient documentation

## 2016-12-27 DIAGNOSIS — K640 First degree hemorrhoids: Secondary | ICD-10-CM | POA: Insufficient documentation

## 2016-12-27 DIAGNOSIS — Z1211 Encounter for screening for malignant neoplasm of colon: Secondary | ICD-10-CM | POA: Insufficient documentation

## 2016-12-27 DIAGNOSIS — Z86010 Personal history of colonic polyps: Secondary | ICD-10-CM | POA: Insufficient documentation

## 2016-12-27 DIAGNOSIS — E785 Hyperlipidemia, unspecified: Secondary | ICD-10-CM | POA: Diagnosis not present

## 2016-12-27 HISTORY — PX: COLONOSCOPY WITH PROPOFOL: SHX5780

## 2016-12-27 LAB — GLUCOSE, CAPILLARY: Glucose-Capillary: 129 mg/dL — ABNORMAL HIGH (ref 65–99)

## 2016-12-27 SURGERY — COLONOSCOPY WITH PROPOFOL
Anesthesia: General

## 2016-12-27 MED ORDER — SODIUM CHLORIDE 0.9 % IV SOLN
INTRAVENOUS | Status: DC
  Administered 2016-12-27 (×2): via INTRAVENOUS

## 2016-12-27 MED ORDER — VANCOMYCIN HCL IN DEXTROSE 1-5 GM/200ML-% IV SOLN
1000.0000 mg | Freq: Once | INTRAVENOUS | Status: AC
  Administered 2016-12-27: 1000 mg via INTRAVENOUS

## 2016-12-27 MED ORDER — FENTANYL CITRATE (PF) 100 MCG/2ML IJ SOLN
INTRAMUSCULAR | Status: DC | PRN
  Administered 2016-12-27 (×2): 50 ug via INTRAVENOUS

## 2016-12-27 MED ORDER — LIDOCAINE 2% (20 MG/ML) 5 ML SYRINGE
INTRAMUSCULAR | Status: DC | PRN
  Administered 2016-12-27: 40 mg via INTRAVENOUS

## 2016-12-27 MED ORDER — VANCOMYCIN HCL IN DEXTROSE 1-5 GM/200ML-% IV SOLN
INTRAVENOUS | Status: AC
  Administered 2016-12-27: 1000 mg via INTRAVENOUS
  Filled 2016-12-27: qty 200

## 2016-12-27 MED ORDER — FENTANYL CITRATE (PF) 100 MCG/2ML IJ SOLN
INTRAMUSCULAR | Status: AC
  Filled 2016-12-27: qty 2

## 2016-12-27 MED ORDER — PROPOFOL 10 MG/ML IV BOLUS
INTRAVENOUS | Status: DC | PRN
  Administered 2016-12-27: 100 mg via INTRAVENOUS

## 2016-12-27 MED ORDER — PROPOFOL 500 MG/50ML IV EMUL
INTRAVENOUS | Status: DC | PRN
  Administered 2016-12-27: 160 ug/kg/min via INTRAVENOUS

## 2016-12-27 MED ORDER — SODIUM CHLORIDE 0.9 % IV SOLN: INTRAVENOUS | Status: DC

## 2016-12-27 MED ORDER — GENTAMICIN SULFATE 40 MG/ML IJ SOLN
100.0000 mg | Freq: Once | INTRAVENOUS | Status: AC
  Administered 2016-12-27: 100 mg via INTRAVENOUS
  Filled 2016-12-27: qty 2.5

## 2016-12-27 NOTE — Transfer of Care (Signed)
Immediate Anesthesia Transfer of Care Note  Patient: James Clayton  Procedure(s) Performed: COLONOSCOPY WITH PROPOFOL (N/A )  Patient Location: PACU and Endoscopy Unit  Anesthesia Type:General  Level of Consciousness: awake and patient cooperative  Airway & Oxygen Therapy: Patient Spontanous Breathing and Patient connected to nasal cannula oxygen  Post-op Assessment: Report given to RN and Post -op Vital signs reviewed and stable  Post vital signs: Reviewed and stable  Last Vitals:  Vitals:   12/27/16 0811  BP: (!) 185/80  Pulse: 74  Resp: (!) 22  Temp: (!) 36.1 C  SpO2: 100%    Last Pain:  Vitals:   12/27/16 0811  TempSrc: Tympanic         Complications: No apparent anesthesia complications

## 2016-12-27 NOTE — Anesthesia Post-op Follow-up Note (Signed)
Anesthesia QCDR form completed.        

## 2016-12-27 NOTE — Op Note (Signed)
[facility] Patient Name: James Clayton Procedure Date: 12/27/2016 10:44 AM MRN: [id number] Account #: [id number] Date of Birth: 1946-03-04 Admit Type: Outpatient Age: 70 Room: [facility] ENDO ROOM 3 Gender: Male Note Status: Finalized Procedure:            Colonoscopy Indications:          High risk colon cancer surveillance: Personal history                        of colonic polyps Providers:            [name], MD Referring MD:         Duane Lope. Judithann Sheen, MD (Referring MD) Medicines:            Propofol per Anesthesia Complications:        No immediate complications. Procedure:            Pre-Anesthesia Assessment:                       - After reviewing the risks and benefits, the patient                        was deemed in satisfactory condition to undergo the                        procedure.                       After obtaining informed consent, the colonoscope was                        passed under direct vision. Throughout the procedure,                        the patient's blood pressure, pulse, and oxygen                        saturations were monitored continuously. The                        Colonoscope was introduced through the anus and                        advanced to the the cecum, identified by appendiceal                        orifice and ileocecal valve. The colonoscopy was                        performed without difficulty. The patient tolerated the                        procedure well. The quality of the bowel preparation                        was good. Findings:      Many small-mouthed diverticula were found in the sigmoid colon and       descending colon.      Internal hemorrhoids were found during endoscopy. The hemorrhoids were       small and Grade I (internal hemorrhoids that do not prolapse).  The exam was otherwise without abnormality. Impression:           - Diverticulosis in the sigmoid colon  and in the                        descending colon.                       - Internal hemorrhoids.                       - The examination was otherwise normal.                       - No specimens collected. Recommendation:       - Repeat colonoscopy in 5 years for surveillance. [name], MD 12/27/2016 11:12:17 AM This report has been signed electronically. Number of Addenda: 0 Note Initiated On: 12/27/2016 10:44 AM Scope Withdrawal Time: 0 hours 7 minutes 48 seconds  Total Procedure Duration: 0 hours 20 minutes 21 seconds       [facility]

## 2016-12-27 NOTE — Anesthesia Preprocedure Evaluation (Signed)
Anesthesia Evaluation  Patient identified by MRN, date of birth, ID band Patient awake    Reviewed: Allergy & Precautions, NPO status , Patient's Chart, lab work & pertinent test results  History of Anesthesia Complications Negative for: history of anesthetic complications  Airway Mallampati: III       Dental  (+) Upper Dentures   Pulmonary neg sleep apnea, neg COPD, former smoker,           Cardiovascular hypertension, Pt. on medications and Pt. on home beta blockers + CAD, + Past MI, + CABG and +CHF  (-) dysrhythmias (-) Valvular Problems/Murmurs     Neuro/Psych neg Seizures    GI/Hepatic Neg liver ROS, neg GERD  ,  Endo/Other  diabetes, Type 2, Oral Hypoglycemic Agents  Renal/GU Renal hypertensionRenal disease     Musculoskeletal   Abdominal   Peds  Hematology   Anesthesia Other Findings   Reproductive/Obstetrics                             Anesthesia Physical Anesthesia Plan  ASA: III  Anesthesia Plan: General   Post-op Pain Management:    Induction: Intravenous  PONV Risk Score and Plan: Propofol infusion  Airway Management Planned: Nasal Cannula  Additional Equipment:   Intra-op Plan:   Post-operative Plan:   Informed Consent: I have reviewed the patients History and Physical, chart, labs and discussed the procedure including the risks, benefits and alternatives for the proposed anesthesia with the patient or authorized representative who has indicated his/her understanding and acceptance.     Plan Discussed with:   Anesthesia Plan Comments:         Anesthesia Quick Evaluation

## 2016-12-27 NOTE — Anesthesia Postprocedure Evaluation (Signed)
Anesthesia Post Note  Patient: James Clayton  Procedure(s) Performed: COLONOSCOPY WITH PROPOFOL (N/A )  Patient location during evaluation: Endoscopy Anesthesia Type: General Level of consciousness: awake and alert Pain management: pain level controlled Vital Signs Assessment: post-procedure vital signs reviewed and stable Respiratory status: spontaneous breathing and respiratory function stable Cardiovascular status: stable Anesthetic complications: no     Last Vitals:  Vitals:   12/27/16 1113 12/27/16 1115  BP: 109/69 124/68  Pulse: 72 69  Resp: (!) 26 18  Temp: 36.4 C (!) 36.2 C  SpO2: 95% 97%    Last Pain:  Vitals:   12/27/16 1113  TempSrc: Tympanic                 Quintus Premo K

## 2016-12-27 NOTE — H&P (Signed)
Primary Care Physician:  [name], MD Primary Gastroenterologist:  Dr. Mechele Collin  Pre-Procedure History & Physical: HPI:  James Clayton is a 71 y.o. male is here for an colonoscopy.   Past Medical History:  Diagnosis Date  . CAD (coronary artery disease)    a. LHC 02/12/16: ostLM-LM 90% severely calcified, ost-mLAD 90%, ostRamus-Ramus 80%, ostLCx-pLCx 90%, ostRCA-pRCA 100% severely calcified, LVEDP severely elevated; b. 01/2016 CABG x 3 (LIMA->LAD, VG->PDA, VG->RI).   . Chronic diastolic CHF (congestive heart failure) (HCC)    a. echo 02/10/16: EF 50-55%, mild inf HK, GR2DD, mild to mod MR, PASP 46 mmHg  . CKD (chronic kidney disease), stage III (HCC)   . Diabetes mellitus with complication (HCC)   . HLD (hyperlipidemia)   . Hypertensive heart disease   . Ileus (HCC)    a. 02/2016 following CABG.  . Morbid obesity (HCC)   . Post-operative Atrial Fibrillation    a. 01/2016 following CABG.    Past Surgical History:  Procedure Laterality Date  . CARDIAC CATHETERIZATION N/A 02/12/2016   Procedure: Left Heart Cath and Coronary Angiography;  Surgeon: [name], MD;  Location: [facility] INVASIVE CV LAB;  Service: Cardiovascular;  Laterality: N/A;  . CORONARY ARTERY BYPASS GRAFT N/A 02/13/2016   Procedure: CORONARY ARTERY BYPASS GRAFTING (CABG)x 3 with endoscopic harvesting of right saphenous  vein LIMA to LAD SVG to Ramus SVG to PDA;  Surgeon: [name], MD;  Location: [facility] OR;  Service: Open Heart Surgery;  Laterality: N/A;  . JOINT REPLACEMENT    . TEE WITHOUT CARDIOVERSION N/A 02/13/2016   Procedure: TRANSESOPHAGEAL ECHOCARDIOGRAM (TEE);  Surgeon: [name], MD;  Location: [facility] OR;  Service: Open Heart Surgery;  Laterality: N/A;    Prior to Admission medications   Medication Sig Start Date End Date Taking? Authorizing Provider  allopurinol (ZYLOPRIM) 100 MG tablet Take 100 mg by mouth 2 (two) times daily.  01/22/16  Yes [provider]  aspirin EC 325  MG EC tablet Take 1 tablet (325 mg total) by mouth daily. 02/26/16  Yes Doree Fudge M, PA-C  atorvastatin (LIPITOR) 40 MG tablet Take 1 tablet (40 mg total) by mouth daily. 02/26/16  Yes Doree Fudge M, PA-C  carvedilol (COREG) 25 MG tablet Take 25 mg by mouth daily.  01/22/16  Yes [provider]  glipiZIDE (GLUCOTROL XL) 2.5 MG 24 hr tablet Take 2.5 mg by mouth daily. 01/22/16  Yes [provider]  losartan (COZAAR) 50 MG tablet Take 50 mg by mouth daily.   Yes [provider]  omega-3 acid ethyl esters (LOVAZA) 1 g capsule Take 1 capsule (1 g total) by mouth 2 (two) times daily. 02/26/16  Yes Doree Fudge M, PA-C  terazosin (HYTRIN) 5 MG capsule Take 5 mg by mouth at bedtime.  01/22/16  Yes [provider]  amLODipine (NORVASC) 10 MG tablet Take 1 tablet (10 mg total) by mouth daily. 06/06/16 11/12/16  Ok Anis, NP    Allergies as of 10/11/2016  . (No Known Allergies)    Family History  Problem Relation Age of Onset  . Cancer Mother   . Heart disease Father     Social History   Social History  . Marital status: Married    Spouse name: N/A  . Number of children: N/A  . Years of education: N/A   Occupational History  . retired    Social History Main Topics  . Smoking status: Former Games developer  . Smokeless  tobacco: Never Used  . Alcohol use No  . Drug use: No  . Sexual activity: Not on file   Other Topics Concern  . Not on file   Social History Narrative  . No narrative on file    Review of Systems: See HPI, otherwise negative ROS  Physical Exam: BP (!) 185/80   Pulse 74   Temp (!) 97 F (36.1 C) (Tympanic)   Resp (!) 22   Ht  (1.854 m)   Wt 110.7 kg (244 lb)   SpO2 100%   BMI 32.19 kg/m  General:   Alert,  pleasant and cooperative in NAD Head:  Normocephalic and atraumatic. Neck:  Supple; no masses or thyromegaly. Lungs:  Clear throughout to auscultation.    Heart:  Regular rate and  rhythm. Abdomen:  Soft, nontender and nondistended. Normal bowel sounds, without guarding, and without rebound.   Neurologic:  Alert and  oriented x4;  grossly normal neurologically.  Impression/Plan: James Clayton is here for an colonoscopy to be performed for [facility] colon polyps.  Risks, benefits, limitations, and alternatives regarding  colonoscopy have been reviewed with the patient.  Questions have been answered.  All parties agreeable.   Lynnae Prude, MD  12/27/2016, 10:35 AM

## 2016-12-30 ENCOUNTER — Encounter: Payer: Self-pay | Admitting: Unknown Physician Specialty

## 2017-01-17 ENCOUNTER — Other Ambulatory Visit: Payer: Self-pay | Admitting: Nurse Practitioner

## 2017-01-23 ENCOUNTER — Other Ambulatory Visit: Payer: Self-pay | Admitting: *Deleted

## 2017-01-23 MED ORDER — AMLODIPINE BESYLATE 10 MG PO TABS: 10.0000 mg | ORAL_TABLET | Freq: Every day | ORAL | 2 refills | Status: DC

## 2017-01-31 ENCOUNTER — Encounter: Payer: Self-pay | Admitting: Cardiovascular Disease

## 2017-01-31 ENCOUNTER — Telehealth: Payer: Self-pay | Admitting: Cardiovascular Disease

## 2017-01-31 ENCOUNTER — Other Ambulatory Visit: Payer: Self-pay

## 2017-01-31 MED ORDER — CARVEDILOL 25 MG PO TABS: 25.0000 mg | ORAL_TABLET | Freq: Two times a day (BID) | ORAL | 1 refills | Status: DC

## 2017-01-31 MED ORDER — CARVEDILOL 25 MG PO TABS: 25.0000 mg | ORAL_TABLET | Freq: Every day | ORAL | 3 refills | Status: DC

## 2017-01-31 NOTE — Telephone Encounter (Signed)
Per MyChart message: "As requested took my BP 2 hours after morning meds. BP was 157/83 pulse 77. Also called and have an appointment with Dr. Judithann SheenSparks Tuesday Nov. 20th at 3pm. Thanks Fayrene FearingJames"  Given BP remains elevated even with increased losartan dosage, will route to Dr. Kirke CorinArida for advice regarding refilling coreg.   Discussed with Dr. Kirke CorinArida who is agreeable to refill coreg. Pt notified.  Refill sent to [facility]

## 2017-01-31 NOTE — Telephone Encounter (Signed)
S/w Laurelyn SickleKatina at [facility] who states prior coreg prescriptions have been 25mg  BID and they have been dispensing this order. Resent prescription.

## 2017-01-31 NOTE — Telephone Encounter (Signed)
Per MyChart message: "Blood pressure has risen in last few days. 160's/95-97. Have a few RX's that need renewed. Says can't be filled at this time.  Anything I can do to get BP back down?  Thanks, Fayrene FearingJames"  S/w pt who reports the following:  11/16 168/94  HR 71 11/15 186/97  HR 70 11/15   176/85 HR 73  169/96  HR 81 11/14   163/88  HR 75              175/91 HR  He takes amlodipine 10mg  but ran out of coreg two weeks ago. Dr. Charm BargesButler, PCP, originally prescribed coreg but [place] [facility]; Pt [facility] August OV. Requested refill today through MyChart. Dr. Cherylann RatelLateef increased losartan to 100mg  qd on Tuesday.  Advised to take amlodipine and losartan now as he has not take AM dose. He will recheck BP and HR in two hours and call or MyChart message w/readings. Will then discuss w/Dr. Kirke CorinArida regarding coreg refill, dosage, and POC.

## 2017-01-31 NOTE — Telephone Encounter (Signed)
Pharmacy calling having a question about prescription Carvedilol that we sent to them yesterday  The question was we send in patient to take 1 tablet daily But patient has been taking 1 tablet 2 times a day  Would like to know what patient is to be taking  Please call back

## 2017-03-07 ENCOUNTER — Other Ambulatory Visit: Payer: Self-pay | Admitting: Internal Medicine

## 2017-03-07 DIAGNOSIS — I700 Atherosclerosis of aorta: Secondary | ICD-10-CM

## 2017-03-21 ENCOUNTER — Ambulatory Visit
Admission: RE | Admit: 2017-03-21 | Discharge: 2017-03-21 | Disposition: A | Discharge status: Home / Self Care | Payer: Medicare Other | Source: Ambulatory Visit | Attending: Internal Medicine | Admitting: Internal Medicine

## 2017-03-21 DIAGNOSIS — I2510 Atherosclerotic heart disease of native coronary artery without angina pectoris: Secondary | ICD-10-CM | POA: Diagnosis present

## 2017-03-21 DIAGNOSIS — I77811 Abdominal aortic ectasia: Secondary | ICD-10-CM | POA: Insufficient documentation

## 2017-03-21 DIAGNOSIS — I700 Atherosclerosis of aorta: Secondary | ICD-10-CM | POA: Insufficient documentation

## 2017-11-04 ENCOUNTER — Encounter: Payer: Self-pay | Admitting: Cardiovascular Disease

## 2017-11-04 ENCOUNTER — Ambulatory Visit: Payer: Medicare Other | Admitting: Cardiovascular Disease

## 2017-11-04 VITALS — BP 130/60 | HR 70 | Ht 73.0 in | Wt 245.0 lb

## 2017-11-04 DIAGNOSIS — I2510 Atherosclerotic heart disease of native coronary artery without angina pectoris: Secondary | ICD-10-CM

## 2017-11-04 DIAGNOSIS — E782 Mixed hyperlipidemia: Secondary | ICD-10-CM | POA: Diagnosis not present

## 2017-11-04 DIAGNOSIS — I10 Essential (primary) hypertension: Secondary | ICD-10-CM

## 2017-11-04 NOTE — Patient Instructions (Signed)
Medication Instructions: Your physician recommends that you continue on your current medications as directed. Please refer to the Current Medication list given to you today.  If you need a refill on your cardiac medications before your next appointment, please call your pharmacy.   Follow-Up: Your physician wants you to follow-up in 12 month with Dr. Kirke CorinArida. You will receive a reminder letter in the mail two months in advance. If you don't receive a letter, please call our office at [phone] to schedule this follow-up appointment.  Thank you for [facility]!

## 2017-11-04 NOTE — Progress Notes (Signed)
Cardiology Office Note   Date:  11/04/2017   ID:  [name], DOB November 29, 1945, MRN [id number]  PCP:  [name], MD  Cardiologist:   [name], MD   Chief Complaint  Patient presents with  . other    12 month f/u no complaints today. Meds reviewed verbally with pt.      History of Present Illness: James Clayton is a 72 y.o. male who presents for a follow-up visit regarding coronary artery disease. He has chronic medical conditions that include hypertension, hyperlipidemia, type 2 diabetes, stage III chronic kidney disease and obesity. 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 Cardiac catheterization showed severe three-vessel and left main  disease. Echo showed normal LV systolic function.  He has been doing well with no shortness of breath or palpitations.  He reports occasional right-sided chest pain that is worse with certain positions and movements.  He has no exertional chest pain.  He takes his medications regularly.  He does have known history of severely elevated triglyceride around 500.  He is about to be enrolled in a clinical trial for hyperlipidemia with his primary care physician.   Past Medical History:  Diagnosis Date  . CAD (coronary artery disease)    a. LHC 02/12/16: ostLM-LM 90% severely calcified, ost-mLAD 90%, ostRamus-Ramus 80%, ostLCx-pLCx 90%, ostRCA-pRCA 100% severely calcified, LVEDP severely elevated; b. 01/2016 CABG x 3 (LIMA->LAD, VG->PDA, VG->RI).   . Chronic diastolic CHF (congestive heart failure) (HCC)    a. echo 02/10/16: EF 50-55%, mild inf HK, GR2DD, mild to mod MR, PASP 46 mmHg  . CKD (chronic kidney disease), stage III (HCC)   . Diabetes mellitus with complication (HCC)   . HLD (hyperlipidemia)   . Hypertensive heart disease   . Ileus (HCC)    a. 02/2016 following CABG.  . Morbid obesity (HCC)   . Post-operative Atrial Fibrillation    a. 01/2016 following  CABG.    Past Surgical History:  Procedure Laterality Date  . CARDIAC CATHETERIZATION N/A 02/12/2016   Procedure: Left Heart Cath and Coronary Angiography;  Surgeon: [name], MD;  Location: [facility] INVASIVE CV LAB;  Service: Cardiovascular;  Laterality: N/A;  . COLONOSCOPY WITH PROPOFOL N/A 12/27/2016   Procedure: COLONOSCOPY WITH PROPOFOL;  Surgeon: [name], MD;  [facility] ENDOSCOPY;  Service: Endoscopy;  Laterality: N/A;  . CORONARY ARTERY BYPASS GRAFT N/A 02/13/2016   Procedure: CORONARY ARTERY BYPASS GRAFTING (CABG)x 3 with endoscopic harvesting of right saphenous  vein LIMA to LAD SVG to Ramus SVG to PDA;  Surgeon: [name], MD;  Location: [facility] OR;  Service: Open Heart Surgery;  Laterality: N/A;  . JOINT REPLACEMENT    . TEE WITHOUT CARDIOVERSION N/A 02/13/2016   Procedure: TRANSESOPHAGEAL ECHOCARDIOGRAM (TEE);  Surgeon: [name], MD;  Location: [facility] OR;  Service: Open Heart Surgery;  Laterality: N/A;     Current Outpatient Medications  Medication Sig Dispense Refill  . allopurinol (ZYLOPRIM) 100 MG tablet Take 100 mg by mouth 2 (two) times daily.   12  . amLODipine (NORVASC) 10 MG tablet Take 1 tablet (10 mg total) daily by mouth. 90 tablet 2  . aspirin EC 325 MG EC tablet Take 1 tablet (325 mg total) by mouth daily. 30 tablet 0  . atorvastatin (LIPITOR) 40 MG tablet Take 1 tablet (40 mg total) by mouth daily. 30 tablet 1  . carvedilol (COREG) 25 MG tablet Take 1  tablet (25 mg total) 2 (two) times daily with a meal by mouth. 180 tablet 1  . glipiZIDE (GLUCOTROL XL) 2.5 MG 24 hr tablet Take 2.5 mg by mouth daily.  11  . losartan (COZAAR) 50 MG tablet Take 50 mg by mouth daily.    Marland Kitchen. omega-3 acid ethyl esters (LOVAZA) 1 g capsule Take 1 capsule (1 g total) by mouth 2 (two) times daily.    Marland Kitchen. terazosin (HYTRIN) 5 MG capsule Take 5 mg by mouth at bedtime.   5   No current facility-administered medications for this visit.     Allergies:   Penicillin g     Social History:  The patient  reports that he has quit smoking. He has never used smokeless tobacco. He reports that he does not drink alcohol or use drugs.   Family History:  The patient's family history includes Cancer in his mother; Heart disease in his father.    ROS:  Please see the history of present illness.   Otherwise, review of systems are positive for none.   All other systems are reviewed and negative.    PHYSICAL EXAM: VS:  BP 130/60 (BP Location: Left Arm, Patient Position: Sitting, Cuff Size: Normal)   Pulse 70   Ht 6\' 1"  (1.854 m)   Wt 245 lb (111.1 kg)   BMI 32.32 kg/m  , BMI Body mass index is 32.32 kg/m. GEN: Well nourished, well developed, in no acute distress  HEENT: normal  Neck: no JVD, carotid bruits, or masses Cardiac: RRR; no  rubs, or gallops. 2/ 6 systolic ejection murmur at the base. There is mild bilateral leg edema Respiratory:  clear to auscultation bilaterally, normal work of breathing GI: soft, nontender, nondistended, + BS MS: no deformity or atrophy  Skin: warm and dry, no rash Neuro:  Strength and sensation are intact Psych: euthymic mood, full affect   EKG:  EKG is ordered today. The ekg ordered today demonstrates normal sinus rhythm with nonspecific T wave changes.   Recent Labs: No results found for requested labs within last 8760 hours.    Lipid Panel    Component Value Date/Time   CHOL 105 02/13/2016 0508   TRIG 173 (H) 02/20/2016 0440   HDL 21 (L) 02/13/2016 0508   CHOLHDL 5.0 02/13/2016 0508   VLDL 47 (H) 02/13/2016 0508   LDLCALC 37 02/13/2016 0508      Wt Readings from Last 3 Encounters:  11/04/17 245 lb (111.1 kg)  12/27/16 244 lb (110.7 kg)  11/12/16 249 lb 8 oz (113.2 kg)       PAD Screen 03/07/2016  Previous PAD dx? No  Previous surgical procedure? No  Pain with walking? Yes  Subsides with rest? Yes  Feet/toe relief with dangling? No  Painful, non-healing ulcers? No  Extremities discolored? No       ASSESSMENT AND PLAN:  1.  Coronary artery disease involving native coronary arteries without angina: He is doing extremely well with no anginal symptoms. Continue medical therapy.  2. Essential hypertension: Blood pressure is well controlled on current medications.  Stable leg edema on amlodipine.  Most recent creatinine was 2.  He is followed by Dr. Lourdes SledgeLatif.  3. Hyperlipidemia: Continue treatment with atorvastatin 40 mg daily.  I reviewed lipid profile done this month which showed a total cholesterol of 113.  Triglyceride was 526.  LDL could not be calculated.  Previous LDL was 36. The patient is being enrolled in a clinical study.  I am not  aware of the details. Treatment with Vascepa should be considered given favorable cardiovascular outcome with this medication in patients with elevated triglyceride.  4. Obesity: Continue with healthy lifestyle changes.    Disposition:   FU with me in 12 months  Signed,  Lorine BearsMuhammad Arida, MD  11/04/2017 1:57 PM    [facility]

## 2017-11-28 ENCOUNTER — Other Ambulatory Visit: Payer: Self-pay

## 2017-11-28 MED ORDER — AMLODIPINE BESYLATE 10 MG PO TABS: 10.0000 mg | ORAL_TABLET | Freq: Every day | ORAL | 3 refills | Status: DC

## 2018-08-17 ENCOUNTER — Telehealth: Payer: Self-pay | Admitting: Cardiovascular Disease

## 2018-09-11 NOTE — Telephone Encounter (Signed)
Made in error

## 2018-11-17 ENCOUNTER — Encounter: Payer: Self-pay | Admitting: Cardiovascular Disease

## 2018-11-17 ENCOUNTER — Other Ambulatory Visit: Payer: Self-pay

## 2018-11-17 ENCOUNTER — Ambulatory Visit: Payer: Medicare Other | Admitting: Cardiovascular Disease

## 2018-11-17 VITALS — BP 162/80 | HR 68 | Ht 73.0 in | Wt 247.0 lb

## 2018-11-17 DIAGNOSIS — I10 Essential (primary) hypertension: Secondary | ICD-10-CM

## 2018-11-17 DIAGNOSIS — I2510 Atherosclerotic heart disease of native coronary artery without angina pectoris: Secondary | ICD-10-CM

## 2018-11-17 DIAGNOSIS — E785 Hyperlipidemia, unspecified: Secondary | ICD-10-CM

## 2018-11-17 NOTE — Patient Instructions (Signed)
Medication Instructions:  Your physician recommends that you continue on your current medications as directed. Please refer to the Current Medication list given to you today.  If you need a refill on your cardiac medications before your next appointment, please call your pharmacy.   Lab work: None ordered If you have labs (blood work) drawn today and your tests are completely normal, you will receive your results only by: Marland Kitchen MyChart Message (if you have MyChart) OR . A paper copy in the mail If you have any lab test that is abnormal or we need to change your treatment, we will call you to review the results.  Testing/Procedures: Your physician has requested that you have a renal artery duplex. During this test, an ultrasound is used to evaluate blood flow to the kidneys. Allow one hour for this exam. Do not eat after midnight the day before and avoid carbonated beverages. Take your medications as you usually do.    Follow-Up: At [facility], you and your health needs are our priority.  As part of our continuing mission to provide you with exceptional heart care, we have created designated Provider Care Teams.  These Care Teams include your primary Cardiologist (physician) and Advanced Practice Providers (APPs -  Physician Assistants and Nurse Practitioners) who all work together to provide you with the care you need, when you need it. You will need a follow up appointment in 6 months.  Please call our office 2 months in advance to schedule this appointment.  You may see Dr. Fletcher Anon  or one of the following Advanced Practice Providers on your designated Care Team:   Murray Hodgkins, NP Christell Faith, PA-C . Marrianne Mood, PA-C  Any Other Special Instructions Will Be Listed Below (If Applicable). N/A

## 2018-11-17 NOTE — Progress Notes (Signed)
Cardiology Office Note   Date:  11/17/2018   ID:  James PaceJames R Gracia, DOB 23-Jun-1945, MRN [id number]  PCP:  Marguarite ArbourSparks, Jeffrey D, MD  Cardiologist:   Lorine BearsMuhammad Jill Ruppe, MD   Chief Complaint  Patient presents with  . other    12 mo.f/u.Medications reviewed verbally.      History of Present Illness: James Clayton is a 73 y.o. male who presents for a follow-up visit regarding coronary artery disease. He has chronic medical conditions that include hypertension, hyperlipidemia, type 2 diabetes, stage III chronic kidney disease and obesity. 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 Cardiac catheterization showed severe three-vessel and left main  disease. Echo showed normal LV systolic function.  He has been doing well overall.  He reports occasional episodes of chest pain which is not exertional and different from his prior angina.  No significant exertional dyspnea.  His renal function has been stable and followed by nephrology with most recent creatinine of 2.  Blood pressure continues to be somewhat difficult to control in spite of 4 different antihypertensive medications. He is enrolled in a research study for treatment of hyperlipidemia with his primary care physician.  Seems to be a blinded study as he does not know if he is getting the active medication or placebo.  He had an echocardiogram done with his primary care physician in February which showed normal LV systolic function without significant valvular abnormalities.  The echo was done for a cardiac murmur.   Past Medical History:  Diagnosis Date  . CAD (coronary artery disease)    a. LHC 02/12/16: ostLM-LM 90% severely calcified, ost-mLAD 90%, ostRamus-Ramus 80%, ostLCx-pLCx 90%, ostRCA-pRCA 100% severely calcified, LVEDP severely elevated; b. 01/2016 CABG x 3 (LIMA->LAD, VG->PDA, VG->RI).   . Chronic diastolic CHF (congestive heart failure) (HCC)    a. echo  02/10/16: EF 50-55%, mild inf HK, GR2DD, mild to mod MR, PASP 46 mmHg  . CKD (chronic kidney disease), stage III (HCC)   . Diabetes mellitus with complication (HCC)   . HLD (hyperlipidemia)   . Hypertensive heart disease   . Ileus (HCC)    a. 02/2016 following CABG.  . Morbid obesity (HCC)   . Post-operative Atrial Fibrillation    a. 01/2016 following CABG.    Past Surgical History:  Procedure Laterality Date  . CARDIAC CATHETERIZATION N/A 02/12/2016   Procedure: Left Heart Cath and Coronary Angiography;  Surgeon: Iran OuchMuhammad A Chan Sheahan, MD;  Location: [facility] INVASIVE CV LAB;  Service: Cardiovascular;  Laterality: N/A;  . COLONOSCOPY WITH PROPOFOL N/A 12/27/2016   Procedure: COLONOSCOPY WITH PROPOFOL;  Surgeon: Scot JunElliott, Robert T, MD;  [facility];  Service: Endoscopy;  Laterality: N/A;  . CORONARY ARTERY BYPASS GRAFT N/A 02/13/2016   Procedure: CORONARY ARTERY BYPASS GRAFTING (CABG)x 3 with endoscopic harvesting of right saphenous  vein LIMA to LAD SVG to Ramus SVG to PDA;  Surgeon: Kerin PernaPeter Van Trigt, MD;  [facility];  Service: Open Heart Surgery;  Laterality: N/A;  . JOINT REPLACEMENT    . TEE WITHOUT CARDIOVERSION N/A 02/13/2016   Procedure: TRANSESOPHAGEAL ECHOCARDIOGRAM (TEE);  Surgeon: Kerin PernaPeter Van Trigt, MD;  [facility];  Service: Open Heart Surgery;  Laterality: N/A;     Current Outpatient Medications  Medication Sig Dispense Refill  . allopurinol (ZYLOPRIM) 100 MG tablet Take 100 mg by mouth 2 (two) times daily.   12  . amLODipine (NORVASC) 10 MG tablet Take 1 tablet (10 mg  total) by mouth daily. 90 tablet 3  . aspirin EC 325 MG EC tablet Take 1 tablet (325 mg total) by mouth daily. 30 tablet 0  . atorvastatin (LIPITOR) 40 MG tablet Take 1 tablet (40 mg total) by mouth daily. 30 tablet 1  . carvedilol (COREG) 25 MG tablet Take 1 tablet (25 mg total) 2 (two) times daily with a meal by mouth. 180 tablet 1  . glipiZIDE (GLUCOTROL XL) 2.5 MG 24 hr tablet Take 2.5 mg by  mouth daily.  11  . losartan (COZAAR) 50 MG tablet Take 50 mg by mouth daily.    Marland Kitchen omega-3 acid ethyl esters (LOVAZA) 1 g capsule Take 1 capsule (1 g total) by mouth 2 (two) times daily.    Marland Kitchen terazosin (HYTRIN) 5 MG capsule Take 5 mg by mouth at bedtime.   5   No current facility-administered medications for this visit.     Allergies:   Penicillin g    Social History:  The patient  reports that he has quit smoking. He has never used smokeless tobacco. He reports that he does not drink alcohol or use drugs.   Family History:  The patient's family history includes Cancer in his mother; Heart disease in his father.    ROS:  Please see the history of present illness.   Otherwise, review of systems are positive for none.   All other systems are reviewed and negative.    PHYSICAL EXAM: VS:  BP (!) 162/80 (BP Location: Left Arm, Patient Position: Sitting, Cuff Size: Normal)   Pulse 68   Ht 6\' 1"  (1.854 m)   Wt 247 lb (112 kg)   SpO2 98%   BMI 32.59 kg/m  , BMI Body mass index is 32.59 kg/m. GEN: Well nourished, well developed, in no acute distress  HEENT: normal  Neck: no JVD, carotid bruits, or masses Cardiac: RRR; no  rubs, or gallops. 2/ 6 systolic ejection murmur at the base. There is mild bilateral leg edema Respiratory:  clear to auscultation bilaterally, normal work of breathing GI: soft, nontender, nondistended, + BS MS: no deformity or atrophy  Skin: warm and dry, no rash Neuro:  Strength and sensation are intact Psych: euthymic mood, full affect   EKG:  EKG is ordered today. The ekg ordered today demonstrates normal sinus rhythm with nonspecific T wave changes.   Recent Labs: No results found for requested labs within last 8760 hours.    Lipid Panel    Component Value Date/Time   CHOL 105 02/13/2016 0508   TRIG 173 (H) 02/20/2016 0440   HDL 21 (L) 02/13/2016 0508   CHOLHDL 5.0 02/13/2016 0508   VLDL 47 (H) 02/13/2016 0508   LDLCALC 37 02/13/2016 0508       Wt Readings from Last 3 Encounters:  11/17/18 247 lb (112 kg)  11/04/17 245 lb (111.1 kg)  12/27/16 244 lb (110.7 kg)       PAD Screen 03/07/2016  Previous PAD dx? No  Previous surgical procedure? No  Pain with walking? Yes  Subsides with rest? Yes  Feet/toe relief with dangling? No  Painful, non-healing ulcers? No  Extremities discolored? No      ASSESSMENT AND PLAN:  1.  Coronary artery disease involving native coronary arteries without angina: He is doing extremely well with no anginal symptoms. Continue medical therapy.  2. Essential hypertension: Blood pressure continues to be elevated in spite of 4 different antihypertensive medications.  He also has underlying chronic kidney disease.  Due to that, I requested renal artery duplex to evaluate for possible renal artery stenosis.   3. Hyperlipidemia: Continue treatment with atorvastatin 40 mg daily.  Most recent lipid profile showed an LDL of 20.  However, his triglyceride was still elevated above 350.   I made no changes in his medications for now given that he is enrolled in a research study.  We could consider switching him from Lovaza to Vascepa in the future.  4. Obesity: Continue with healthy lifestyle changes.    Disposition:   FU with me in 6 months  Signed,  [name], MD  11/17/2018 3:23 PM    [facility]

## 2018-12-08 ENCOUNTER — Other Ambulatory Visit: Payer: Self-pay

## 2018-12-08 ENCOUNTER — Ambulatory Visit (INDEPENDENT_AMBULATORY_CARE_PROVIDER_SITE_OTHER): Payer: Medicare Other

## 2018-12-08 DIAGNOSIS — I10 Essential (primary) hypertension: Secondary | ICD-10-CM

## 2018-12-10 ENCOUNTER — Ambulatory Visit: Payer: Self-pay | Admitting: Urology

## 2018-12-10 DIAGNOSIS — R600 Localized edema: Secondary | ICD-10-CM | POA: Insufficient documentation

## 2018-12-10 DIAGNOSIS — R809 Proteinuria, unspecified: Secondary | ICD-10-CM | POA: Insufficient documentation

## 2018-12-10 DIAGNOSIS — N2581 Secondary hyperparathyroidism of renal origin: Secondary | ICD-10-CM | POA: Insufficient documentation

## 2018-12-15 ENCOUNTER — Ambulatory Visit (INDEPENDENT_AMBULATORY_CARE_PROVIDER_SITE_OTHER): Payer: Medicare Other | Admitting: Cardiovascular Disease

## 2018-12-15 ENCOUNTER — Other Ambulatory Visit: Payer: Self-pay

## 2018-12-15 ENCOUNTER — Encounter: Payer: Self-pay | Admitting: Cardiovascular Disease

## 2018-12-15 ENCOUNTER — Telehealth: Payer: Self-pay

## 2018-12-15 VITALS — BP 146/74 | HR 82 | Ht 73.0 in | Wt 244.0 lb

## 2018-12-15 DIAGNOSIS — I701 Atherosclerosis of renal artery: Secondary | ICD-10-CM | POA: Diagnosis not present

## 2018-12-15 DIAGNOSIS — I2510 Atherosclerotic heart disease of native coronary artery without angina pectoris: Secondary | ICD-10-CM | POA: Diagnosis not present

## 2018-12-15 DIAGNOSIS — I10 Essential (primary) hypertension: Secondary | ICD-10-CM | POA: Diagnosis not present

## 2018-12-15 DIAGNOSIS — E782 Mixed hyperlipidemia: Secondary | ICD-10-CM

## 2018-12-15 NOTE — Telephone Encounter (Signed)
Called to give the patient renal artery duplex results and Dr. Tyrell Antonio recommendation. [email or web address].

## 2018-12-15 NOTE — Telephone Encounter (Signed)
Patient made aware of renal artery duplex results and Dr. Tyrell Antonio recommendation. Appt scheduled for today at 4:40pm with Dr. Fletcher Anon. COVID screening done.       COVID-19 Pre-Screening Questions:  . In the past 7 to 10 days have you had a cough,  shortness of breath, headache, congestion, fever (100 or greater) body aches, chills, sore throat, or sudden loss of taste or sense of smell? No  Have you been around anyone with known Covid 19. No . Have you been around anyone who is awaiting Covid 19 test results in the past 7 to 10 days? No . Have you been around anyone who has been exposed to Covid 19, or has mentioned symptoms of Covid 19 within the past 7 to 10 days? No

## 2018-12-15 NOTE — Telephone Encounter (Signed)
-----   Message from Muhammad A Arida, MD sent at 12/14/2018  1:50 PM EDT ----- Inform patient that duplex showed evidence of bilateral renal artery stenosis. Add him to my schedule this week on Thursday ( 3:20 pm is open). He will likely require an angiogram. 

## 2018-12-15 NOTE — Patient Instructions (Signed)
Medication Instructions:  Your physician recommends that you continue on your current medications as directed. Please refer to the Current Medication list given to you today.  If you need a refill on your cardiac medications before your next appointment, please call your pharmacy.   Lab work: Your physician recommends that you return for lab work on Friday 12/18/18  You will need COVID testing prior to your procedure. Please have your test performed on 01/05/19 between 12:30-2:30pm at the [facility] medical mall. You will need to self quarantine after testing up to your procedure date and time.  If you have labs (blood work) drawn today and your tests are completely normal, you will receive your results only by: James Kitchen MyChart Message (if you have MyChart) OR . A paper copy in the mail If you have any lab test that is abnormal or we need to change your treatment, we will call you to review the results.  Testing/Procedures: None ordered  Follow-Up: At [facility], you and your health needs are our priority.  As part of our continuing mission to provide you with exceptional heart care, we have created designated Provider Care Teams.  These Care Teams include your primary Cardiologist (physician) and Advanced Practice Providers (APPs -  Physician Assistants and Nurse Practitioners) who all work together to provide you with the care you need, when you need it. You will need a follow up appointment after your procedure    You may see  James Clayton or one of the following Advanced Practice Providers on your designated Care Team:   Murray Hodgkins, NP James Faith, PA-C . James Mood, PA-C  Any Other Special Instructions Will Be Listed Below (If Applicable).    [facility] [address] [place] [address] Dept: [phone] Loc: [phone]  James Clayton  12/15/2018  You are scheduled for a  renal artery angiogram on Wednesday, October 7 with Dr. Kathlyn Sacramento.  1. Please arrive at the [facility] (Main Entrance A) at [facility]: [address] [place], [place] [address] at 8:00 AM (This time is two hours before your procedure to ensure your preparation). Free valet parking service is available.   Special note: Every effort is made to have your procedure done on time. Please understand that emergencies sometimes delay scheduled procedures.  2. Diet: Do not eat solid foods after midnight.  The patient may have clear liquids until 5am upon the day of the procedure.  3. Labs: You will need to have blood drawn on Friday  October 2 at [facility], Go to 1st desk on your right to register.  Address: [address], [place] [address]  Open: 8am - 5pm  Phone: [phone]. You do not need to be fasting.  4. Medication instructions in preparation for your procedure:   Contrast Allergy: No   Current Outpatient Medications (Endocrine & Metabolic):  .  glipiZIDE (GLUCOTROL XL) 2.5 MG 24 hr tablet, Take 2.5 mg by mouth daily.  Current Outpatient Medications (Cardiovascular):  .  amLODipine (NORVASC) 10 MG tablet, Take 1 tablet (10 mg total) by mouth daily. James Kitchen  atorvastatin (LIPITOR) 40 MG tablet, Take 1 tablet (40 mg total) by mouth daily. .  carvedilol (COREG) 25 MG tablet, Take 1 tablet (25 mg total) 2 (two) times daily with a meal by mouth. .  losartan (COZAAR) 50 MG tablet, Take 50 mg by mouth daily. James Kitchen  omega-3 acid  ethyl esters (LOVAZA) 1 g capsule, Take 1 capsule (1 g total) by mouth 2 (two) times daily. James Kitchen  terazosin (HYTRIN) 5 MG capsule, Take 5 mg by mouth at bedtime.    Current Outpatient Medications (Analgesics):  .  acetaminophen (TYLENOL) 500 MG tablet, Take by mouth. James Kitchen  allopurinol (ZYLOPRIM) 100 MG tablet, Take 100 mg by mouth 2 (two) times daily.  James Kitchen  aspirin EC 325 MG EC tablet, Take 1 tablet (325 mg total) by mouth daily.           Do not take Diabetes Med Glipizide on the day of the procedure and HOLD 48 HOURS AFTER THE PROCEDURE.  On the morning of your procedure, take your Aspirin and any morning medicines NOT listed above.  You may use sips of water.  5. Plan for one night stay--bring personal belongings. 6. Bring a current list of your medications and current insurance cards. 7. You MUST have a responsible person to drive you home. 8. Someone MUST be with you the first 24 hours after you arrive home or your discharge will be delayed. 9. Please wear clothes that are easy to get on and off and wear slip-on shoes.  Thank you for allowing [place] to care for you!   -- [facility]

## 2018-12-15 NOTE — Telephone Encounter (Signed)
-----   Message from Wellington Hampshire, MD sent at 12/14/2018  1:50 PM EDT ----- Inform patient that duplex showed evidence of bilateral renal artery stenosis. Add him to my schedule this week on Thursday ( 3:20 pm is open). He will likely require an angiogram.

## 2018-12-15 NOTE — Progress Notes (Signed)
Cardiology Office Note   Date:  12/15/2018   ID:  James PaceJames R Hammersmith, DOB 1945/12/16, MRN [id number]  PCP:  Marguarite ArbourSparks, Jeffrey D, MD  Cardiologist:   Lorine BearsMuhammad Laryssa Hassing, MD   No chief complaint on file.     History of Present Illness: James Clayton is a 73 y.o. male who presents for a follow-up visit regarding coronary artery disease. He has chronic medical conditions that include hypertension, hyperlipidemia, type 2 diabetes, stage III chronic kidney disease and obesity. 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 Cardiac catheterization showed severe three-vessel and left main  disease. Echo showed normal LV systolic function.  He has been doing well overall.  He reports occasional episodes of chest pain which is not exertional and different from his prior angina.  No significant exertional dyspnea.  His renal function has been stable and followed by nephrology with most recent creatinine of 2.  Blood pressure continues to be somewhat difficult to control in spite of 4 different antihypertensive medications. He is enrolled in a research study for treatment of hyperlipidemia with his primary care physician.  Seems to be a blinded study as he does not know if he is getting the active medication or placebo.  He had an echocardiogram done with his primary care physician in February which showed normal LV systolic function without significant valvular abnormalities.  The echo was done for a cardiac murmur.  Due to refractory hypertension, I requested renal artery duplex which was done recently and showed evidence of significant bilateral renal artery stenosis worse on the left side.  Kidney size was normal bilaterally.  The patient was seen by Dr. Cherylann RatelLateef his nephrologist.  He discussed the case with me and we both felt that the patient would benefit from renal artery angiography and revascularization.  Past Medical History:  Diagnosis  Date  . CAD (coronary artery disease)    a. LHC 02/12/16: ostLM-LM 90% severely calcified, ost-mLAD 90%, ostRamus-Ramus 80%, ostLCx-pLCx 90%, ostRCA-pRCA 100% severely calcified, LVEDP severely elevated; b. 01/2016 CABG x 3 (LIMA->LAD, VG->PDA, VG->RI).   . Chronic diastolic CHF (congestive heart failure) (HCC)    a. echo 02/10/16: EF 50-55%, mild inf HK, GR2DD, mild to mod MR, PASP 46 mmHg  . CKD (chronic kidney disease), stage III (HCC)   . Diabetes mellitus with complication (HCC)   . HLD (hyperlipidemia)   . Hypertensive heart disease   . Ileus (HCC)    a. 02/2016 following CABG.  . Morbid obesity (HCC)   . Post-operative Atrial Fibrillation    a. 01/2016 following CABG.    Past Surgical History:  Procedure Laterality Date  . CARDIAC CATHETERIZATION N/A 02/12/2016   Procedure: Left Heart Cath and Coronary Angiography;  Surgeon: Iran OuchMuhammad A Arshad Oberholzer, MD;  Location: [facility] INVASIVE CV LAB;  Service: Cardiovascular;  Laterality: N/A;  . COLONOSCOPY WITH PROPOFOL N/A 12/27/2016   Procedure: COLONOSCOPY WITH PROPOFOL;  Surgeon: Scot JunElliott, Robert T, MD;  [facility];  Service: Endoscopy;  Laterality: N/A;  . CORONARY ARTERY BYPASS GRAFT N/A 02/13/2016   Procedure: CORONARY ARTERY BYPASS GRAFTING (CABG)x 3 with endoscopic harvesting of right saphenous  vein LIMA to LAD SVG to Ramus SVG to PDA;  Surgeon: Kerin PernaPeter Van Trigt, MD;  [facility];  Service: Open Heart Surgery;  Laterality: N/A;  . JOINT REPLACEMENT    . TEE WITHOUT CARDIOVERSION N/A 02/13/2016   Procedure: TRANSESOPHAGEAL ECHOCARDIOGRAM (TEE);  Surgeon: Kerin PernaPeter Van Trigt, MD;  Location:  [facility] OR;  Service: Open Heart Surgery;  Laterality: N/A;     Current Outpatient Medications  Medication Sig Dispense Refill  . acetaminophen (TYLENOL) 500 MG tablet Take by mouth.    Marland Kitchen allopurinol (ZYLOPRIM) 100 MG tablet Take 100 mg by mouth 2 (two) times daily.   12  . amLODipine (NORVASC) 10 MG tablet Take 1 tablet (10 mg total) by mouth  daily. 90 tablet 3  . aspirin EC 325 MG EC tablet Take 1 tablet (325 mg total) by mouth daily. 30 tablet 0  . atorvastatin (LIPITOR) 40 MG tablet Take 1 tablet (40 mg total) by mouth daily. 30 tablet 1  . carvedilol (COREG) 25 MG tablet Take 1 tablet (25 mg total) 2 (two) times daily with a meal by mouth. 180 tablet 1  . glipiZIDE (GLUCOTROL XL) 2.5 MG 24 hr tablet Take 2.5 mg by mouth daily.  11  . losartan (COZAAR) 50 MG tablet Take 50 mg by mouth daily.    Marland Kitchen omega-3 acid ethyl esters (LOVAZA) 1 g capsule Take 1 capsule (1 g total) by mouth 2 (two) times daily.    Marland Kitchen terazosin (HYTRIN) 5 MG capsule Take 5 mg by mouth at bedtime.   5   No current facility-administered medications for this visit.     Allergies:   Penicillin g    Social History:  The patient  reports that he has quit smoking. He has never used smokeless tobacco. He reports that he does not drink alcohol or use drugs.   Family History:  The patient's family history includes Cancer in his mother; Heart disease in his father.    ROS:  Please see the history of present illness.   Otherwise, review of systems are positive for none.   All other systems are reviewed and negative.    PHYSICAL EXAM: VS:  BP (!) 146/74 (BP Location: Left Arm, Patient Position: Sitting, Cuff Size: Normal)   Pulse 82   Ht 6\' 1"  (1.854 m)   Wt 244 lb (110.7 kg)   BMI 32.19 kg/m  , BMI Body mass index is 32.19 kg/m. GEN: Well nourished, well developed, in no acute distress  HEENT: normal  Neck: no JVD, carotid bruits, or masses Cardiac: RRR; no  rubs, or gallops. 2/ 6 systolic ejection murmur at the base. There is mild bilateral leg edema Respiratory:  clear to auscultation bilaterally, normal work of breathing GI: soft, nontender, nondistended, + BS MS: no deformity or atrophy  Skin: warm and dry, no rash Neuro:  Strength and sensation are intact Psych: euthymic mood, full affect Femoral pulses palpable bilaterally but seems to be stronger  on the left side.  EKG:  EKG is ordered today. The ekg ordered today demonstrates normal sinus rhythm with nonspecific T wave changes.   Recent Labs: No results found for requested labs within last 8760 hours.    Lipid Panel    Component Value Date/Time   CHOL 105 02/13/2016 0508   TRIG 173 (H) 02/20/2016 0440   HDL 21 (L) 02/13/2016 0508   CHOLHDL 5.0 02/13/2016 0508   VLDL 47 (H) 02/13/2016 0508   LDLCALC 37 02/13/2016 0508      Wt Readings from Last 3 Encounters:  12/15/18 244 lb (110.7 kg)  11/17/18 247 lb (112 kg)  11/04/17 245 lb (111.1 kg)       PAD Screen 03/07/2016  Previous PAD dx? No  Previous surgical procedure? No  Pain with walking? Yes  Subsides with rest? Yes  Feet/toe relief with dangling? No  Painful, non-healing ulcers? No  Extremities discolored? No      ASSESSMENT AND PLAN:  1.  Bilateral renal artery stenosis: Worse on the left side.  The patient's blood pressure is uncontrolled in spite of 4 different blood pressure medications.  I discussed with him the indications for renal artery revascularization.  Given refractory hypertension on bilateral renal artery stenosis, I think he benefit from renal artery angiography and revascularization.  I had a prolonged discussion with him about the risk of the procedure especially that he has underlying chronic kidney disease and there is a risk of worsening renal function with contrast use and also with the possibility of plaque embolization.  I discussed other risks of the procedure.  The patient is agreeable to proceed and the plan is to use CO2 angiography for the most part of the procedure.  I did explain to him that small amount of contrast will likely be required and thus will bring him 4 hours early for hydration.  He had his preprocedure labs done yesterday with Dr. Cherylann Ratel and the results are on care everywhere.  2. Coronary artery disease involving native coronary arteries without angina: He is  doing extremely well with no anginal symptoms. Continue medical therapy.  3. Essential hypertension: Blood pressure continues to be elevated in spite of 4 different antihypertensive medications.  Renal artery stenosis is likely contributing as discussed above.  4. Hyperlipidemia: Continue treatment with atorvastatin 40 mg daily.  Most recent lipid profile showed an LDL of 20.  However, his triglyceride was still elevated above 350.   I made no changes in his medications for now given that he is enrolled in a research study.  We could consider switching him from Lovaza to Vascepa in the future.  4. Obesity: Continue with healthy lifestyle changes.    Disposition:   FU with me in 1 months  Signed,  [name], MD  12/15/2018 5:09 PM    [facility]

## 2018-12-15 NOTE — H&P (View-Only) (Signed)
  Cardiology Office Note   Date:  12/15/2018   ID:  James Clayton, DOB 04/17/1945, MRN [id number]  PCP:  Sparks, Jeffrey D, MD  Cardiologist:   Muhammad Arida, MD   No chief complaint on file.     History of Present Illness: James Clayton is a 73 y.o. male who presents for a follow-up visit regarding coronary artery disease. He has chronic medical conditions that include hypertension, hyperlipidemia, type 2 diabetes, stage III chronic kidney disease and obesity. 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 Cardiac catheterization showed severe three-vessel and left main  disease. Echo showed normal LV systolic function.  He has been doing well overall.  He reports occasional episodes of chest pain which is not exertional and different from his prior angina.  No significant exertional dyspnea.  His renal function has been stable and followed by nephrology with most recent creatinine of 2.  Blood pressure continues to be somewhat difficult to control in spite of 4 different antihypertensive medications. He is enrolled in a research study for treatment of hyperlipidemia with his primary care physician.  Seems to be a blinded study as he does not know if he is getting the active medication or placebo.  He had an echocardiogram done with his primary care physician in February which showed normal LV systolic function without significant valvular abnormalities.  The echo was done for a cardiac murmur.  Due to refractory hypertension, I requested renal artery duplex which was done recently and showed evidence of significant bilateral renal artery stenosis worse on the left side.  Kidney size was normal bilaterally.  The patient was seen by Dr. Lateef his nephrologist.  He discussed the case with me and we both felt that the patient would benefit from renal artery angiography and revascularization.  Past Medical History:  Diagnosis  Date  . CAD (coronary artery disease)    a. LHC 02/12/16: ostLM-LM 90% severely calcified, ost-mLAD 90%, ostRamus-Ramus 80%, ostLCx-pLCx 90%, ostRCA-pRCA 100% severely calcified, LVEDP severely elevated; b. 01/2016 CABG x 3 (LIMA->LAD, VG->PDA, VG->RI).   . Chronic diastolic CHF (congestive heart failure) (HCC)    a. echo 02/10/16: EF 50-55%, mild inf HK, GR2DD, mild to mod MR, PASP 46 mmHg  . CKD (chronic kidney disease), stage III (HCC)   . Diabetes mellitus with complication (HCC)   . HLD (hyperlipidemia)   . Hypertensive heart disease   . Ileus (HCC)    a. 02/2016 following CABG.  . Morbid obesity (HCC)   . Post-operative Atrial Fibrillation    a. 01/2016 following CABG.    Past Surgical History:  Procedure Laterality Date  . CARDIAC CATHETERIZATION N/A 02/12/2016   Procedure: Left Heart Cath and Coronary Angiography;  Surgeon: Muhammad A Arida, MD;  Location: [facility] INVASIVE CV LAB;  Service: Cardiovascular;  Laterality: N/A;  . COLONOSCOPY WITH PROPOFOL N/A 12/27/2016   Procedure: COLONOSCOPY WITH PROPOFOL;  Surgeon: Elliott, Robert T, MD;  Location: [facility] ENDOSCOPY;  Service: Endoscopy;  Laterality: N/A;  . CORONARY ARTERY BYPASS GRAFT N/A 02/13/2016   Procedure: CORONARY ARTERY BYPASS GRAFTING (CABG)x 3 with endoscopic harvesting of right saphenous  vein LIMA to LAD SVG to Ramus SVG to PDA;  Surgeon: Peter Van Trigt, MD;  Location: [facility] OR;  Service: Open Heart Surgery;  Laterality: N/A;  . JOINT REPLACEMENT    . TEE WITHOUT CARDIOVERSION N/A 02/13/2016   Procedure: TRANSESOPHAGEAL ECHOCARDIOGRAM (TEE);  Surgeon: Peter Van Trigt, MD;  Location:   [facility] OR;  Service: Open Heart Surgery;  Laterality: N/A;     Current Outpatient Medications  Medication Sig Dispense Refill  . acetaminophen (TYLENOL) 500 MG tablet Take by mouth.    Marland Kitchen allopurinol (ZYLOPRIM) 100 MG tablet Take 100 mg by mouth 2 (two) times daily.   12  . amLODipine (NORVASC) 10 MG tablet Take 1 tablet (10 mg total) by mouth  daily. 90 tablet 3  . aspirin EC 325 MG EC tablet Take 1 tablet (325 mg total) by mouth daily. 30 tablet 0  . atorvastatin (LIPITOR) 40 MG tablet Take 1 tablet (40 mg total) by mouth daily. 30 tablet 1  . carvedilol (COREG) 25 MG tablet Take 1 tablet (25 mg total) 2 (two) times daily with a meal by mouth. 180 tablet 1  . glipiZIDE (GLUCOTROL XL) 2.5 MG 24 hr tablet Take 2.5 mg by mouth daily.  11  . losartan (COZAAR) 50 MG tablet Take 50 mg by mouth daily.    Marland Kitchen omega-3 acid ethyl esters (LOVAZA) 1 g capsule Take 1 capsule (1 g total) by mouth 2 (two) times daily.    Marland Kitchen terazosin (HYTRIN) 5 MG capsule Take 5 mg by mouth at bedtime.   5   No current facility-administered medications for this visit.     Allergies:   Penicillin g    Social History:  The patient  reports that he has quit smoking. He has never used smokeless tobacco. He reports that he does not drink alcohol or use drugs.   Family History:  The patient's family history includes Cancer in his mother; Heart disease in his father.    ROS:  Please see the history of present illness.   Otherwise, review of systems are positive for none.   All other systems are reviewed and negative.    PHYSICAL EXAM: VS:  BP (!) 146/74 (BP Location: Left Arm, Patient Position: Sitting, Cuff Size: Normal)   Pulse 82   Ht 6\' 1"  (1.854 m)   Wt 244 lb (110.7 kg)   BMI 32.19 kg/m  , BMI Body mass index is 32.19 kg/m. GEN: Well nourished, well developed, in no acute distress  HEENT: normal  Neck: no JVD, carotid bruits, or masses Cardiac: RRR; no  rubs, or gallops. 2/ 6 systolic ejection murmur at the base. There is mild bilateral leg edema Respiratory:  clear to auscultation bilaterally, normal work of breathing GI: soft, nontender, nondistended, + BS MS: no deformity or atrophy  Skin: warm and dry, no rash Neuro:  Strength and sensation are intact Psych: euthymic mood, full affect Femoral pulses palpable bilaterally but seems to be stronger  on the left side.  EKG:  EKG is ordered today. The ekg ordered today demonstrates normal sinus rhythm with nonspecific T wave changes.   Recent Labs: No results found for requested labs within last 8760 hours.    Lipid Panel    Component Value Date/Time   CHOL 105 02/13/2016 0508   TRIG 173 (H) 02/20/2016 0440   HDL 21 (L) 02/13/2016 0508   CHOLHDL 5.0 02/13/2016 0508   VLDL 47 (H) 02/13/2016 0508   LDLCALC 37 02/13/2016 0508      Wt Readings from Last 3 Encounters:  12/15/18 244 lb (110.7 kg)  11/17/18 247 lb (112 kg)  11/04/17 245 lb (111.1 kg)       PAD Screen 03/07/2016  Previous PAD dx? No  Previous surgical procedure? No  Pain with walking? Yes  Subsides with rest? Yes  Feet/toe relief with dangling? No  Painful, non-healing ulcers? No  Extremities discolored? No      ASSESSMENT AND PLAN:  1.  Bilateral renal artery stenosis: Worse on the left side.  The patient's blood pressure is uncontrolled in spite of 4 different blood pressure medications.  I discussed with him the indications for renal artery revascularization.  Given refractory hypertension on bilateral renal artery stenosis, I think he benefit from renal artery angiography and revascularization.  I had a prolonged discussion with him about the risk of the procedure especially that he has underlying chronic kidney disease and there is a risk of worsening renal function with contrast use and also with the possibility of plaque embolization.  I discussed other risks of the procedure.  The patient is agreeable to proceed and the plan is to use CO2 angiography for the most part of the procedure.  I did explain to him that small amount of contrast will likely be required and thus will bring him 4 hours early for hydration.  He had his preprocedure labs done yesterday with Dr. Cherylann Ratel and the results are on care everywhere.  2. Coronary artery disease involving native coronary arteries without angina: He is  doing extremely well with no anginal symptoms. Continue medical therapy.  3. Essential hypertension: Blood pressure continues to be elevated in spite of 4 different antihypertensive medications.  Renal artery stenosis is likely contributing as discussed above.  4. Hyperlipidemia: Continue treatment with atorvastatin 40 mg daily.  Most recent lipid profile showed an LDL of 20.  However, his triglyceride was still elevated above 350.   I made no changes in his medications for now given that he is enrolled in a research study.  We could consider switching him from Lovaza to Vascepa in the future.  4. Obesity: Continue with healthy lifestyle changes.    Disposition:   FU with me in 1 months  Signed,  [name], MD  12/15/2018 5:09 PM    [facility]

## 2018-12-16 ENCOUNTER — Ambulatory Visit: Payer: Medicare Other | Admitting: Urology

## 2018-12-16 ENCOUNTER — Encounter: Payer: Self-pay | Admitting: Urology

## 2018-12-16 VITALS — BP 132/77 | HR 81 | Ht 73.0 in | Wt 243.7 lb

## 2018-12-16 DIAGNOSIS — N5201 Erectile dysfunction due to arterial insufficiency: Secondary | ICD-10-CM

## 2018-12-16 NOTE — Progress Notes (Signed)
12/16/2018 8:57 AM   James Clayton February 27, 1946 [id number]  Referring provider: Marguarite ArbourSparks, Jeffrey D, MD [address] [facility] [facility] [place] [id number]  Chief Complaint  Patient presents with  . Erectile Dysfunction    HPI: James ChimesJames Wynns is a 73 y.o. male seen at the request of Dr. Judithann SheenSparks for evaluation of erectile dysfunction.  He presents with a 2-year history of difficulty achieving and maintaining an erection which has progressively worsened.  He presently has minimal partial erections which are almost never firm enough for penetration.  The rare times he achieves penetration he is typically unable to maintain the erection.  SHIM score today was 5/25 indicating severe ED.  He denies pain or curvature with erections.  He tried tadalafil 20 mg and had slight improvement in tumescence but his erection was not firm enough for penetration.  Organic risk factors include hypertension, hyperlipidemia, type 2 diabetes, coronary artery disease, history of tobacco use and blood pressure lowering medications including amlodipine, carvedilol, losartan and terazosin.  He denies bothersome lower urinary tract symptoms.   PMH: Past Medical History:  Diagnosis Date  . CAD (coronary artery disease)    a. LHC 02/12/16: ostLM-LM 90% severely calcified, ost-mLAD 90%, ostRamus-Ramus 80%, ostLCx-pLCx 90%, ostRCA-pRCA 100% severely calcified, LVEDP severely elevated; b. 01/2016 CABG x 3 (LIMA->LAD, VG->PDA, VG->RI).   . Chronic diastolic CHF (congestive heart failure) (HCC)    a. echo 02/10/16: EF 50-55%, mild inf HK, GR2DD, mild to mod MR, PASP 46 mmHg  . CKD (chronic kidney disease), stage III (HCC)   . Diabetes mellitus with complication (HCC)   . HLD (hyperlipidemia)   . Hypertensive heart disease   . Ileus (HCC)    a. 02/2016 following CABG.  . Morbid obesity (HCC)   . Post-operative Atrial Fibrillation    a. 01/2016 following CABG.    Surgical History: Past Surgical  History:  Procedure Laterality Date  . CARDIAC CATHETERIZATION N/A 02/12/2016   Procedure: Left Heart Cath and Coronary Angiography;  Surgeon: Iran OuchMuhammad A Arida, MD;  Location: [facility] INVASIVE CV LAB;  Service: Cardiovascular;  Laterality: N/A;  . COLONOSCOPY WITH PROPOFOL N/A 12/27/2016   Procedure: COLONOSCOPY WITH PROPOFOL;  Surgeon: Scot JunElliott, Robert T, MD;  [facility];  Service: Endoscopy;  Laterality: N/A;  . CORONARY ARTERY BYPASS GRAFT N/A 02/13/2016   Procedure: CORONARY ARTERY BYPASS GRAFTING (CABG)x 3 with endoscopic harvesting of right saphenous  vein LIMA to LAD SVG to Ramus SVG to PDA;  Surgeon: Kerin PernaPeter Van Trigt, MD;  [facility];  Service: Open Heart Surgery;  Laterality: N/A;  . JOINT REPLACEMENT    . TEE WITHOUT CARDIOVERSION N/A 02/13/2016   Procedure: TRANSESOPHAGEAL ECHOCARDIOGRAM (TEE);  Surgeon: Kerin PernaPeter Van Trigt, MD;  [facility];  Service: Open Heart Surgery;  Laterality: N/A;    Home Medications:  Allergies as of 12/16/2018      Reactions   Penicillin G    Other reaction(s): Other (See Comments) Passed out at age of 73      Medication List       Accurate as of December 16, 2018  8:57 AM. If you have any questions, ask your nurse or doctor.        acetaminophen 500 MG tablet Commonly known as: TYLENOL Take by mouth.   allopurinol 100 MG tablet Commonly known as: ZYLOPRIM Take 100 mg by mouth 2 (two) times daily.   amLODipine 10 MG tablet Commonly known as: NORVASC Take 1 tablet (10 mg  total) by mouth daily.   aspirin 325 MG EC tablet Take 1 tablet (325 mg total) by mouth daily.   atorvastatin 40 MG tablet Commonly known as: LIPITOR Take 1 tablet (40 mg total) by mouth daily.   carvedilol 25 MG tablet Commonly known as: COREG Take 1 tablet (25 mg total) 2 (two) times daily with a meal by mouth.   glipiZIDE 2.5 MG 24 hr tablet Commonly known as: GLUCOTROL XL Take 2.5 mg by mouth daily.   losartan 50 MG tablet Commonly known  as: COZAAR Take 50 mg by mouth daily.   omega-3 acid ethyl esters 1 g capsule Commonly known as: LOVAZA Take 1 capsule (1 g total) by mouth 2 (two) times daily.   terazosin 5 MG capsule Commonly known as: HYTRIN Take 5 mg by mouth at bedtime.       Allergies:  Allergies  Allergen Reactions  . Penicillin G     Other reaction(s): Other (See Comments) Passed out at age of 50    Family History: Family History  Problem Relation Age of Onset  . Cancer Mother   . Heart disease Father     Social History:  reports that he has quit smoking. He has never used smokeless tobacco. He reports that he does not drink alcohol or use drugs.  ROS: UROLOGY Frequent Urination?: No Hard to postpone urination?: No Burning/pain with urination?: No Get up at night to urinate?: No Leakage of urine?: No Urine stream starts and stops?: No Trouble starting stream?: No Do you have to strain to urinate?: No Blood in urine?: No Urinary tract infection?: No Sexually transmitted disease?: No Injury to kidneys or bladder?: No Painful intercourse?: No Weak stream?: No Erection problems?: Yes Penile pain?: No  Gastrointestinal Nausea?: No Vomiting?: No Indigestion/heartburn?: No Diarrhea?: No Constipation?: No  Constitutional Fever: No Night sweats?: No Weight loss?: No Fatigue?: No  Skin Skin rash/lesions?: No Itching?: No  Eyes Blurred vision?: No Double vision?: No  Ears/Nose/Throat Sore throat?: No Sinus problems?: No  Hematologic/Lymphatic Swollen glands?: No Easy bruising?: No  Cardiovascular Leg swelling?: No Chest pain?: No  Respiratory Cough?: No Shortness of breath?: No  Endocrine Excessive thirst?: No  Musculoskeletal Back pain?: No Joint pain?: No  Neurological Headaches?: No Dizziness?: No  Psychologic Depression?: No Anxiety?: No  Physical Exam: BP 132/77 (BP Location: Left Arm, Patient Position: Sitting, Cuff Size: Normal)   Pulse 81    Ht 6\' 1"  (1.854 m)   Wt 243 lb 11.2 oz (110.5 kg)   BMI 32.15 kg/m   Constitutional:  Alert and oriented, No acute distress. HEENT: [place] AT, moist mucus membranes.  Trachea midline, no masses. Cardiovascular: No clubbing, cyanosis, or edema. Respiratory: Normal respiratory effort, no increased work of breathing. GI: Abdomen is soft, nontender, nondistended, no abdominal masses GU: Phallus circumcised without lesions, testes descended bilaterally without masses or tenderness and of normal size.  Small, bilateral hydroceles.  Spermatic cord/epididymis palpably normal bilaterally. Lymph: No cervical or inguinal lymphadenopathy. Skin: No rashes, bruises or suspicious lesions. Neurologic: Grossly intact, no focal deficits, moving all 4 extremities. Psychiatric: Normal mood and affect.   Assessment & Plan:    - Erectile dysfunction He has significant organic factors and has failed a PDE 5 inhibitor trial.  It is unlikely an alternative PDE 5 inhibitor will be effective.  I discussed secondary ED options including intracavernosal injections and vacuum erection devices.  Penile implant surgery was also discussed.  He was primarily interested in vacuum erection  devices.  We were out of patient literature and he was referred to the [facility] website.  He was also given literature on intracavernosal injections.    Riki Altes, MD  [facility] [address] [place], [place] [address] [phone]

## 2018-12-18 ENCOUNTER — Other Ambulatory Visit
Admission: RE | Admit: 2018-12-18 | Discharge: 2018-12-18 | Disposition: A | Discharge status: Home / Self Care | Payer: Medicare Other | Source: Ambulatory Visit | Attending: Cardiovascular Disease | Admitting: Cardiovascular Disease

## 2018-12-18 ENCOUNTER — Other Ambulatory Visit: Payer: Self-pay

## 2018-12-18 DIAGNOSIS — Z20828 Contact with and (suspected) exposure to other viral communicable diseases: Secondary | ICD-10-CM | POA: Insufficient documentation

## 2018-12-18 DIAGNOSIS — Z01812 Encounter for preprocedural laboratory examination: Secondary | ICD-10-CM | POA: Insufficient documentation

## 2018-12-19 LAB — SARS CORONAVIRUS 2 (TAT 6-24 HRS): SARS Coronavirus 2: NEGATIVE

## 2018-12-22 ENCOUNTER — Telehealth: Payer: Self-pay | Admitting: *Deleted

## 2018-12-22 NOTE — Telephone Encounter (Signed)
Pt contacted pre-renal angiogram scheduled at [facility] for: Wednesday October 7,2020 12:30 PM Verified arrival time and place: [place] [facility]) at: 8 AM pre procedure hydration   No solid food after midnight prior to cath, clear liquids until 5 AM day of procedure. Contrast allergy: no  Hold: Losartan-PM prior to procedure-GFR 31 Glipizide-AM of procedure  Except hold medications AM meds can be  taken pre-cath with sip of water including: ASA 81 mg   Confirmed patient has responsible adult to drive home post procedure and observe 24 hours after arriving home: yes  Currently, due to Covid-19 pandemic, only one support person will be allowed with patient. Must be the same support person for that patient's entire stay, will be screened and required to wear a mask. They will be asked to wait in the waiting room for the duration of the patient's stay.  Patients are required to wear a mask when they enter the hospital.      COVID-19 Pre-Screening Questions:  . In the past 7 to 10 days have you had a cough,  shortness of breath, headache, congestion, fever (100 or greater) body aches, chills, sore throat, or sudden loss of taste or sense of smell? no . Have you been around anyone with known Covid 19? no . Have you been around anyone who is awaiting Covid 19 test results in the past 7 to 10 days? no . Have you been around anyone who has been exposed to Covid 19, or has mentioned symptoms of Covid 19 within the past 7 to 10 days? no  I reviewed procedure/mask/visitor instructions, Covid-19 screening questions with patient, he verbalized understanding, thanked me for call.   Lab results 12/14/18 in [place].

## 2018-12-23 ENCOUNTER — Ambulatory Visit (HOSPITAL_COMMUNITY)
Admission: RE | Admit: 2018-12-23 | Discharge: 2018-12-23 | Disposition: A | Discharge status: Home / Self Care | Payer: Medicare Other | Attending: Cardiovascular Disease | Admitting: Cardiovascular Disease

## 2018-12-23 ENCOUNTER — Encounter (HOSPITAL_COMMUNITY)
Admission: RE | Disposition: A | Discharge status: Home / Self Care | Payer: Medicare Other | Source: Home / Self Care | Attending: Cardiovascular Disease

## 2018-12-23 ENCOUNTER — Other Ambulatory Visit: Payer: Self-pay

## 2018-12-23 DIAGNOSIS — Z87891 Personal history of nicotine dependence: Secondary | ICD-10-CM | POA: Insufficient documentation

## 2018-12-23 DIAGNOSIS — Z6832 Body mass index (BMI) 32.0-32.9, adult: Secondary | ICD-10-CM | POA: Diagnosis not present

## 2018-12-23 DIAGNOSIS — Z8249 Family history of ischemic heart disease and other diseases of the circulatory system: Secondary | ICD-10-CM | POA: Insufficient documentation

## 2018-12-23 DIAGNOSIS — I4891 Unspecified atrial fibrillation: Secondary | ICD-10-CM | POA: Insufficient documentation

## 2018-12-23 DIAGNOSIS — I5032 Chronic diastolic (congestive) heart failure: Secondary | ICD-10-CM | POA: Diagnosis not present

## 2018-12-23 DIAGNOSIS — I701 Atherosclerosis of renal artery: Secondary | ICD-10-CM | POA: Diagnosis not present

## 2018-12-23 DIAGNOSIS — N189 Chronic kidney disease, unspecified: Secondary | ICD-10-CM | POA: Diagnosis not present

## 2018-12-23 DIAGNOSIS — E785 Hyperlipidemia, unspecified: Secondary | ICD-10-CM | POA: Diagnosis not present

## 2018-12-23 DIAGNOSIS — Z880 Allergy status to penicillin: Secondary | ICD-10-CM | POA: Insufficient documentation

## 2018-12-23 DIAGNOSIS — E1122 Type 2 diabetes mellitus with diabetic chronic kidney disease: Secondary | ICD-10-CM | POA: Insufficient documentation

## 2018-12-23 DIAGNOSIS — Z7982 Long term (current) use of aspirin: Secondary | ICD-10-CM | POA: Diagnosis not present

## 2018-12-23 DIAGNOSIS — I708 Atherosclerosis of other arteries: Secondary | ICD-10-CM | POA: Insufficient documentation

## 2018-12-23 DIAGNOSIS — N183 Chronic kidney disease, stage 3 unspecified: Secondary | ICD-10-CM | POA: Insufficient documentation

## 2018-12-23 DIAGNOSIS — I2510 Atherosclerotic heart disease of native coronary artery without angina pectoris: Secondary | ICD-10-CM | POA: Insufficient documentation

## 2018-12-23 DIAGNOSIS — Z7984 Long term (current) use of oral hypoglycemic drugs: Secondary | ICD-10-CM | POA: Insufficient documentation

## 2018-12-23 DIAGNOSIS — Z79899 Other long term (current) drug therapy: Secondary | ICD-10-CM | POA: Insufficient documentation

## 2018-12-23 DIAGNOSIS — Z951 Presence of aortocoronary bypass graft: Secondary | ICD-10-CM | POA: Diagnosis not present

## 2018-12-23 HISTORY — PX: RENAL ANGIOGRAPHY: CATH118260

## 2018-12-23 LAB — BASIC METABOLIC PANEL
Anion gap: 11 (ref 5–15)
BUN: 35 mg/dL — ABNORMAL HIGH (ref 8–23)
CO2: 24 mmol/L (ref 22–32)
Calcium: 9.2 mg/dL (ref 8.9–10.3)
Chloride: 105 mmol/L (ref 98–111)
Creatinine, Ser: 2.28 mg/dL — ABNORMAL HIGH (ref 0.61–1.24)
GFR calc Af Amer: 32 mL/min — ABNORMAL LOW (ref >60)
GFR calc non Af Amer: 27 mL/min — ABNORMAL LOW (ref >60)
Glucose, Bld: 195 mg/dL — ABNORMAL HIGH (ref 70–99)
Potassium: 4.1 mmol/L (ref 3.5–5.1)
Sodium: 140 mmol/L (ref 135–145)

## 2018-12-23 LAB — CBC
HCT: 42.2 % (ref 39.0–52.0)
Hemoglobin: 13.8 g/dL (ref 13.0–17.0)
MCH: 29.7 pg (ref 26.0–34.0)
MCHC: 32.7 g/dL (ref 30.0–36.0)
MCV: 90.8 fL (ref 80.0–100.0)
Platelets: 190 10*3/uL (ref 150–400)
RBC: 4.65 MIL/uL (ref 4.22–5.81)
RDW: 13.9 % (ref 11.5–15.5)
WBC: 10.9 10*3/uL — ABNORMAL HIGH (ref 4.0–10.5)
nRBC: 0 % (ref 0.0–0.2)

## 2018-12-23 LAB — GLUCOSE, CAPILLARY
Glucose-Capillary: 171 mg/dL — ABNORMAL HIGH (ref 70–99)
Glucose-Capillary: 114 mg/dL — ABNORMAL HIGH (ref 70–99)

## 2018-12-23 LAB — POCT ACTIVATED CLOTTING TIME: Activated Clotting Time: 235 seconds

## 2018-12-23 SURGERY — RENAL ANGIOGRAPHY
Anesthesia: LOCAL

## 2018-12-23 MED ORDER — ONDANSETRON HCL 4 MG/2ML IJ SOLN: 4.0000 mg | Freq: Four times a day (QID) | INTRAMUSCULAR | Status: DC | PRN

## 2018-12-23 MED ORDER — SODIUM CHLORIDE 0.9 % IV SOLN: 250.0000 mL | INTRAVENOUS | Status: DC | PRN

## 2018-12-23 MED ORDER — IODIXANOL 320 MG/ML IV SOLN
INTRAVENOUS | Status: DC | PRN
  Administered 2018-12-23: 10 mL via INTRA_ARTERIAL

## 2018-12-23 MED ORDER — HEPARIN SODIUM (PORCINE) 1000 UNIT/ML IJ SOLN
INTRAMUSCULAR | Status: AC
  Filled 2018-12-23: qty 1

## 2018-12-23 MED ORDER — VERAPAMIL HCL 2.5 MG/ML IV SOLN
INTRAVENOUS | Status: DC | PRN
  Administered 2018-12-23: 14:00:00 10 mL via INTRA_ARTERIAL

## 2018-12-23 MED ORDER — FENTANYL CITRATE (PF) 100 MCG/2ML IJ SOLN
INTRAMUSCULAR | Status: DC | PRN
  Administered 2018-12-23 (×2): 50 ug via INTRAVENOUS

## 2018-12-23 MED ORDER — VERAPAMIL HCL 2.5 MG/ML IV SOLN
INTRAVENOUS | Status: AC
  Filled 2018-12-23: qty 2

## 2018-12-23 MED ORDER — FENTANYL CITRATE (PF) 100 MCG/2ML IJ SOLN
INTRAMUSCULAR | Status: AC
  Filled 2018-12-23: qty 2

## 2018-12-23 MED ORDER — LABETALOL HCL 5 MG/ML IV SOLN
10.0000 mg | INTRAVENOUS | Status: DC | PRN
  Administered 2018-12-23: 10 mg via INTRAVENOUS
  Filled 2018-12-23: qty 4

## 2018-12-23 MED ORDER — SODIUM CHLORIDE 0.9% FLUSH: 3.0000 mL | Freq: Two times a day (BID) | INTRAVENOUS | Status: DC

## 2018-12-23 MED ORDER — SODIUM CHLORIDE 0.9% FLUSH: 3.0000 mL | INTRAVENOUS | Status: DC | PRN

## 2018-12-23 MED ORDER — LIDOCAINE HCL (PF) 1 % IJ SOLN
INTRAMUSCULAR | Status: AC
  Filled 2018-12-23: qty 30

## 2018-12-23 MED ORDER — HEPARIN (PORCINE) IN NACL 1000-0.9 UT/500ML-% IV SOLN
INTRAVENOUS | Status: DC | PRN
  Administered 2018-12-23 (×2): 500 mL

## 2018-12-23 MED ORDER — ASPIRIN 81 MG PO CHEW: 81.0000 mg | CHEWABLE_TABLET | ORAL | Status: DC

## 2018-12-23 MED ORDER — MIDAZOLAM HCL 2 MG/2ML IJ SOLN
INTRAMUSCULAR | Status: DC | PRN
  Administered 2018-12-23 (×2): 1 mg via INTRAVENOUS

## 2018-12-23 MED ORDER — HYDRALAZINE HCL 20 MG/ML IJ SOLN: 5.0000 mg | INTRAMUSCULAR | Status: DC | PRN

## 2018-12-23 MED ORDER — ACETAMINOPHEN 325 MG PO TABS: 650.0000 mg | ORAL_TABLET | ORAL | Status: DC | PRN

## 2018-12-23 MED ORDER — SODIUM CHLORIDE 0.9 % IV SOLN
INTRAVENOUS | Status: DC
  Administered 2018-12-23: 16:00:00 via INTRAVENOUS

## 2018-12-23 MED ORDER — SODIUM CHLORIDE 0.9 % IV SOLN
INTRAVENOUS | Status: DC
  Administered 2018-12-23: 08:00:00 via INTRAVENOUS

## 2018-12-23 MED ORDER — MIDAZOLAM HCL 2 MG/2ML IJ SOLN
INTRAMUSCULAR | Status: AC
  Filled 2018-12-23: qty 2

## 2018-12-23 MED ORDER — HEPARIN (PORCINE) IN NACL 1000-0.9 UT/500ML-% IV SOLN
INTRAVENOUS | Status: AC
  Filled 2018-12-23: qty 1000

## 2018-12-23 MED ORDER — HEPARIN SODIUM (PORCINE) 1000 UNIT/ML IJ SOLN
INTRAMUSCULAR | Status: DC | PRN
  Administered 2018-12-23: 6000 [IU] via INTRAVENOUS
  Administered 2018-12-23: 3000 [IU] via INTRAVENOUS

## 2018-12-23 MED ORDER — LIDOCAINE HCL (PF) 1 % IJ SOLN
INTRAMUSCULAR | Status: DC | PRN
  Administered 2018-12-23: 2 mL via INTRADERMAL
  Administered 2018-12-23: 15 mL via INTRADERMAL

## 2018-12-23 SURGICAL SUPPLY — 24 items
CATH ANGIO 5F PIGTAIL 65CM (CATHETERS) ×2 IMPLANT
CATH BEACON 5 .035 65 KMP TIP (CATHETERS) ×2 IMPLANT
CATH INFINITI 5F PIG 125CM (CATHETERS) ×2 IMPLANT
CATH LAUNCHER 6FR JR4 (CATHETERS) ×2 IMPLANT
CLOSURE MYNX CONTROL 6F/7F (Vascular Products) ×2 IMPLANT
DEVICE RAD COMP TR BAND LRG (VASCULAR PRODUCTS) ×2 IMPLANT
DEVICE TORQUE .025-.038 (MISCELLANEOUS) ×2 IMPLANT
FILTER CO2 0.2 MICRON (VASCULAR PRODUCTS) ×2 IMPLANT
GLIDESHEATH SLEND SS 6F .021 (SHEATH) ×2 IMPLANT
GUIDEWIRE ANGLED .035X150CM (WIRE) ×2 IMPLANT
KIT MICROPUNCTURE NIT STIFF (SHEATH) ×2 IMPLANT
KIT PV (KITS) ×2 IMPLANT
RESERVOIR CO2 (VASCULAR PRODUCTS) ×2 IMPLANT
SHEATH PINNACLE 6F 10CM (SHEATH) ×2 IMPLANT
SHEATH PROBE COVER 6X72 (BAG) ×2 IMPLANT
SHEATH SHUTTLE 5F/110 (SHEATH) ×2 IMPLANT
SYR MEDRAD MARK 7 150ML (SYRINGE) IMPLANT
TRANSDUCER W/STOPCOCK (MISCELLANEOUS) ×2 IMPLANT
TRAY PV CATH (CUSTOM PROCEDURE TRAY) ×2 IMPLANT
TUBING CIL FLEX 10 FLL-RA (TUBING) ×2 IMPLANT
WIRE BENTSON .035X145CM (WIRE) ×2 IMPLANT
WIRE HI TORQ VERSACORE 300 (WIRE) ×4 IMPLANT
WIRE RUNTHROUGH .014X180CM (WIRE) ×2 IMPLANT
WIRE STABILIZER XS .014X180CM (WIRE) ×2 IMPLANT

## 2018-12-23 NOTE — Progress Notes (Signed)
Vin, PA notified of B/P and ok to d/c home

## 2018-12-23 NOTE — Discharge Instructions (Signed)
Femoral Site Care °This sheet gives you information about how to care for yourself after your procedure. Your health care provider may also give you more specific instructions. If you have problems or questions, contact your health care provider. °What can I expect after the procedure? °After the procedure, it is common to have: °· Bruising that usually fades within 1-2 weeks. °· Tenderness at the site. °Follow these instructions at home: °Wound care °· Follow instructions from your health care provider about how to take care of your insertion site. Make sure you: °? Wash your hands with soap and water before you change your bandage (dressing). If soap and water are not available, use hand sanitizer. °? Change your dressing as told by your health care provider. °? Leave stitches (sutures), skin glue, or adhesive strips in place. These skin closures may need to stay in place for 2 weeks or longer. If adhesive strip edges start to loosen and curl up, you may trim the loose edges. Do not remove adhesive strips completely unless your health care provider tells you to do that. °· Do not take baths, swim, or use a hot tub until your health care provider approves. °· You may shower 24-48 hours after the procedure or as told by your health care provider. °? Gently wash the site with plain soap and water. °? Pat the area dry with a clean towel. °? Do not rub the site. This may cause bleeding. °· Do not apply powder or lotion to the site. Keep the site clean and dry. °· Check your femoral site every day for signs of infection. Check for: °? Redness, swelling, or pain. °? Fluid or blood. °? Warmth. °? Pus or a bad smell. °Activity °· For the first 2-3 days after your procedure, or as long as directed: °? Avoid climbing stairs as much as possible. °? Do not squat. °· Do not lift anything that is heavier than 10 lb (4.5 kg), or the limit that you are told, until your health care provider says that it is safe. °· Rest as  directed. °? Avoid sitting for a long time without moving. Get up to take short walks every 1-2 hours. °· Do not drive for 24 hours if you were given a medicine to help you relax (sedative). °General instructions °· Take over-the-counter and prescription medicines only as told by your health care provider. °· Keep all follow-up visits as told by your health care provider. This is important. °Contact a health care provider if you have: °· A fever or chills. °· You have redness, swelling, or pain around your insertion site. °Get help right away if: °· The catheter insertion area swells very fast. °· You pass out. °· You suddenly start to sweat or your skin gets clammy. °· The catheter insertion area is bleeding, and the bleeding does not stop when you hold steady pressure on the area. °· The area near or just beyond the catheter insertion site becomes pale, cool, tingly, or numb. °These symptoms may represent a serious problem that is an emergency. Do not wait to see if the symptoms will go away. Get medical help right away. Call your local emergency services (911 in the [place] Do not drive yourself to the hospital. °Summary °· After the procedure, it is common to have bruising that usually fades within 1-2 weeks. °· Check your femoral site every day for signs of infection. °· Do not lift anything that is heavier than 10 lb (4.5 kg), or the   limit that you are told, until your health care provider says that it is safe. °This information is not intended to replace advice given to you by your health care provider. Make sure you discuss any questions you have with your health care provider. °Document Released: 11/05/2013 Document Revised: 03/17/2017 Document Reviewed: 03/17/2017 °Elsevier Patient Education © 2020 [employer] °Radial Site Care ° °This sheet gives you information about how to care for yourself after your procedure. Your health care provider may also give you more specific instructions. If you have  problems or questions, contact your health care provider. °What can I expect after the procedure? °After the procedure, it is common to have: °· Bruising and tenderness at the catheter insertion area. °Follow these instructions at home: °Medicines °· Take over-the-counter and prescription medicines only as told by your health care provider. °Insertion site care °· Follow instructions from your health care provider about how to take care of your insertion site. Make sure you: °? Wash your hands with soap and water before you change your bandage (dressing). If soap and water are not available, use hand sanitizer. °? Change your dressing as told by your health care provider. °? Leave stitches (sutures), skin glue, or adhesive strips in place. These skin closures may need to stay in place for 2 weeks or longer. If adhesive strip edges start to loosen and curl up, you may trim the loose edges. Do not remove adhesive strips completely unless your health care provider tells you to do that. °· Check your insertion site every day for signs of infection. Check for: °? Redness, swelling, or pain. °? Fluid or blood. °? Pus or a bad smell. °? Warmth. °· Do not take baths, swim, or use a hot tub until your health care provider approves. °· You may shower 24-48 hours after the procedure, or as directed by your health care provider. °? Remove the dressing and gently wash the site with plain soap and water. °? Pat the area dry with a clean towel. °? Do not rub the site. That could cause bleeding. °· Do not apply powder or lotion to the site. °Activity ° °· For 24 hours after the procedure, or as directed by your health care provider: °? Do not flex or bend the affected arm. °? Do not push or pull heavy objects with the affected arm. °? Do not drive yourself home from the hospital or clinic. You may drive 24 hours after the procedure unless your health care provider tells you not to. °? Do not operate machinery or power tools. °· Do  not lift anything that is heavier than 10 lb (4.5 kg), or the limit that you are told, until your health care provider says that it is safe. °· Ask your health care provider when it is okay to: °? Return to work or school. °? Resume usual physical activities or sports. °? Resume sexual activity. °General instructions °· If the catheter site starts to bleed, raise your arm and put firm pressure on the site. If the bleeding does not stop, get help right away. This is a medical emergency. °· If you went home on the same day as your procedure, a responsible adult should be with you for the first 24 hours after you arrive home. °· Keep all follow-up visits as told by your health care provider. This is important. °Contact a health care provider if: °· You have a fever. °· You have redness, swelling, or yellow drainage around your insertion   site. Get help right away if:  You have unusual pain at the radial site.  The catheter insertion area swells very fast.  The insertion area is bleeding, and the bleeding does not stop when you hold steady pressure on the area.  Your arm or hand becomes pale, cool, tingly, or numb. These symptoms may represent a serious problem that is an emergency. Do not wait to see if the symptoms will go away. Get medical help right away. Call your local emergency services (911 in the [place] Do not drive yourself to the hospital. Summary  After the procedure, it is common to have bruising and tenderness at the site.  Follow instructions from your health care provider about how to take care of your radial site wound. Check the wound every day for signs of infection.  Do not lift anything that is heavier than 10 lb (4.5 kg), or the limit that you are told, until your health care provider says that it is safe. This information is not intended to replace advice given to you by your health care provider. Make sure you discuss any questions you have with your health care  provider. Document Released: 04/06/2010 Document Revised: 04/09/2017 Document Reviewed: 04/09/2017 Elsevier Patient Education  2020 [employer].    Moderate Conscious Sedation, Adult, Care After These instructions provide you with information about caring for yourself after your procedure. Your health care provider may also give you more specific instructions. Your treatment has been planned according to current medical practices, but problems sometimes occur. Call your health care provider if you have any problems or questions after your procedure. What can I expect after the procedure? After your procedure, it is common:  To feel sleepy for several hours.  To feel clumsy and have poor balance for several hours.  To have poor judgment for several hours.  To vomit if you eat too soon. Follow these instructions at home: For at least 24 hours after the procedure:   Do not: ? Participate in activities where you could fall or become injured. ? Drive. ? Use heavy machinery. ? Drink alcohol. ? Take sleeping pills or medicines that cause drowsiness. ? Make important decisions or sign legal documents. ? Take care of children on your own.  Rest. Eating and drinking  Follow the diet recommended by your health care provider.  If you vomit: ? Drink water, juice, or soup when you can drink without vomiting. ? Make sure you have little or no nausea before eating solid foods. General instructions  Have a responsible adult stay with you until you are awake and alert.  Take over-the-counter and prescription medicines only as told by your health care provider.  If you smoke, do not smoke without supervision.  Keep all follow-up visits as told by your health care provider. This is important. Contact a health care provider if:  You keep feeling nauseous or you keep vomiting.  You feel light-headed.  You develop a rash.  You have a fever. Get help right away if:  You have  trouble breathing. This information is not intended to replace advice given to you by your health care provider. Make sure you discuss any questions you have with your health care provider. Document Released: 12/23/2012 Document Revised: 02/14/2017 Document Reviewed: 06/24/2015 Elsevier Patient Education  2020 [employer].

## 2018-12-23 NOTE — Interval H&P Note (Signed)
History and Physical Interval Note:  12/23/2018 1:35 PM  James Clayton  has presented today for surgery, with the diagnosis of renal artery stenosis.  The various methods of treatment have been discussed with the patient and family. After consideration of risks, benefits and other options for treatment, the patient has consented to  Procedure(s): RENAL ANGIOGRAPHY (N/A) as a surgical intervention.  The patient's history has been reviewed, patient examined, no change in status, stable for surgery.  I have reviewed the patient's chart and labs.  Questions were answered to the patient's satisfaction.     Kathlyn Sacramento

## 2018-12-24 ENCOUNTER — Encounter (HOSPITAL_COMMUNITY): Payer: Self-pay | Admitting: Cardiovascular Disease

## 2018-12-29 MED ORDER — ASPIRIN 81 MG PO TBEC: 81.0000 mg | DELAYED_RELEASE_TABLET | Freq: Every day | ORAL | Status: AC

## 2018-12-29 NOTE — Telephone Encounter (Signed)
Patient made aware of Dr. Tyrell Antonio recommendation with verbalized understanding. James Hampshire, MD     Decrease aspirin to 81 mg once daily. Superficial bruising might happen and is expected. As long as there is no swelling in the groin or discomfort there, we can continue to monitor this.

## 2018-12-29 NOTE — Telephone Encounter (Signed)
Contacted the patient after receiving his mychart message regarding his procedure site bleeding.  Patient sts that the site is not actively bleeding. It is dry and intact. The site if flat, he denies warmth to the area. The site is not painful. He does feel mild "stinging" when he walks. Patient sts that the bruising has moved further down from the entry site. The bruising does not seem to be worse than previously. He sent the message because the bruising seemed to move a little. Adv the patient that it is not uncommon for the blood under the skin to move downward away from the site due to gravity. Adv the patient that it would be appropriate for him to keep his 12/31/18 appt/ the site can be evaluated at that time. Adv the patient that I will update Dr. Fletcher Anon and call back if he has additional recommendations.  Patient verbalized understanding and voiced appreciation for the call.

## 2018-12-30 ENCOUNTER — Encounter (HOSPITAL_COMMUNITY): Payer: Self-pay | Admitting: Cardiovascular Disease

## 2018-12-31 ENCOUNTER — Encounter: Payer: Self-pay | Admitting: Nurse Practitioner

## 2018-12-31 ENCOUNTER — Other Ambulatory Visit: Payer: Self-pay

## 2018-12-31 ENCOUNTER — Ambulatory Visit (INDEPENDENT_AMBULATORY_CARE_PROVIDER_SITE_OTHER): Payer: Medicare Other | Admitting: Nurse Practitioner

## 2018-12-31 VITALS — BP 140/70 | HR 68 | Ht 73.0 in | Wt 242.8 lb

## 2018-12-31 DIAGNOSIS — I10 Essential (primary) hypertension: Secondary | ICD-10-CM

## 2018-12-31 DIAGNOSIS — I701 Atherosclerosis of renal artery: Secondary | ICD-10-CM

## 2018-12-31 DIAGNOSIS — E785 Hyperlipidemia, unspecified: Secondary | ICD-10-CM

## 2018-12-31 DIAGNOSIS — I739 Peripheral vascular disease, unspecified: Secondary | ICD-10-CM

## 2018-12-31 DIAGNOSIS — I2510 Atherosclerotic heart disease of native coronary artery without angina pectoris: Secondary | ICD-10-CM | POA: Diagnosis not present

## 2018-12-31 DIAGNOSIS — N1832 Chronic kidney disease, stage 3b: Secondary | ICD-10-CM

## 2018-12-31 NOTE — Patient Instructions (Signed)
Medication Instructions:  Your physician recommends that you continue on your current medications as directed. Please refer to the Current Medication list given to you today.  *If you need a refill on your cardiac medications before your next appointment, please call your pharmacy*  Lab Work: Your physician recommends that you have lab work today(BMET)  If you have labs (blood work) drawn today and your tests are completely normal, you will receive your results only by: Marland Kitchen MyChart Message (if you have MyChart) OR . A paper copy in the mail If you have any lab test that is abnormal or we need to change your treatment, we will call you to review the results.  Testing/Procedures: 1- LE Duplex/ABI Your physician has requested that you have an ankle brachial index (ABI). During this test an ultrasound and blood pressure cuff are used to evaluate the arteries that supply the arms and legs with blood. Allow thirty minutes for this exam. There are no restrictions or special instructions.    Follow-Up: At [facility], you and your health needs are our priority.  As part of our continuing mission to provide you with exceptional heart care, we have created designated Provider Care Teams.  These Care Teams include your primary Cardiologist (physician) and Advanced Practice Providers (APPs -  Physician Assistants and Nurse Practitioners) who all work together to provide you with the care you need, when you need it.  Your next appointment:   1 month  The format for your next appointment:   In Person  Provider:    Please see Kathlyn Sacramento, MD.

## 2018-12-31 NOTE — Progress Notes (Signed)
Office Visit    Patient Name: James Clayton Date of Encounter: 12/31/2018  Primary Care Provider:  [name], MD Primary Cardiologist:  [name], MD  Chief Complaint    73 year old male with a history of CAD status post CABG in 2017, hypertension, hyperlipidemia, type 2 diabetes mellitus, stage III chronic kidney disease, obesity, and recent findings of moderate bilateral renal artery stenosis and peripheral arterial disease, who presents for follow-up after recent peripheral angiography.  Past Medical History    Past Medical History:  Diagnosis Date   CAD (coronary artery disease)    a. LHC 02/12/16: ostLM-LM 90% severely calcified, ost-mLAD 90%, ostRamus-Ramus 80%, ostLCx-pLCx 90%, ostRCA-pRCA 100% severely calcified, LVEDP severely elevated; b. 01/2016 CABG x 3 (LIMA->LAD, VG->PDA, VG->RI).    Chronic diastolic CHF (congestive heart failure) (HCC)    a. echo 02/10/16: EF 50-55%, mild inf HK, GR2DD, mild to mod MR, PASP 46 mmHg; b. 04/2018 Echo: EF >55%. Triv AI. Mild TR.   CKD (chronic kidney disease), stage III    Diabetes mellitus with complication (HCC)    HLD (hyperlipidemia)    Hypertensive heart disease    Ileus (HCC)    a. 02/2016 following CABG.   Morbid obesity (HCC)    PAD (peripheral artery disease) (HCC)    a. 12/2018 Periph Angio: RCIA 99p, LCIA 80p.   Post-operative Atrial Fibrillation    a. 01/2016 following CABG.   Renal artery stenosis (HCC)    a. 12/2018 Renal angio: RRA 50-60, LRA 60-->Med Rx.   Past Surgical History:  Procedure Laterality Date   CARDIAC CATHETERIZATION N/A 02/12/2016   Procedure: Left Heart Cath and Coronary Angiography;  Surgeon: [name], MD;  Location: [facility] INVASIVE CV LAB;  Service: Cardiovascular;  Laterality: N/A;   COLONOSCOPY WITH PROPOFOL N/A 12/27/2016   Procedure: COLONOSCOPY WITH PROPOFOL;  Surgeon: [name], MD;  Location: [facility] ENDOSCOPY;  Service: Endoscopy;  Laterality:  N/A;   CORONARY ARTERY BYPASS GRAFT N/A 02/13/2016   Procedure: CORONARY ARTERY BYPASS GRAFTING (CABG)x 3 with endoscopic harvesting of right saphenous  vein LIMA to LAD SVG to Ramus SVG to PDA;  Surgeon: [name], MD;  Location: [facility] OR;  Service: Open Heart Surgery;  Laterality: N/A;   JOINT REPLACEMENT     RENAL ANGIOGRAPHY N/A 12/23/2018   Procedure: RENAL ANGIOGRAPHY;  Surgeon: [name], MD;  Location: [facility] INVASIVE CV LAB;  Service: Cardiovascular;  Laterality: N/A;   TEE WITHOUT CARDIOVERSION N/A 02/13/2016   Procedure: TRANSESOPHAGEAL ECHOCARDIOGRAM (TEE);  Surgeon: [name], MD;  Location: [facility] OR;  Service: Open Heart Surgery;  Laterality: N/A;    Allergies  Allergies  Allergen Reactions   Penicillin G     Other reaction(s): Other (See Comments) Passed out at age of 68    History of Present Illness    73 year old male with the above past medical history including CAD status post CABG x3 in November 2017 after presenting with a non-STEMI.  Catheterization at that time showed severe three-vessel and left main disease.  Other history includes hypertension, hyperlipidemia, type 2 diabetes mellitus, stage III chronic kidney disease, and obesity.  His most recent echocardiogram was performed in the setting of systolic murmur in February of this year and showed normal LV function with trivial AI and mild TR.  Due to difficult to manage hypertension despite 3 medications, and chronic kidney disease, he underwent renal artery duplex in September which suggested greater than 60% bilateral renal artery  stenosis.  He subsequently underwent abdominal aortic angiography with finding of 50 to 60% right renal artery stenosis and a 60% left renal artery stenosis.  He was also noted to have severe, subtotal occlusive disease of the right common iliac artery (99%), and also severe stenosis of the left common iliac artery (80%).  Continued medical therapy for renal artery stenosis was  warranted and it was also recommended that he have follow-up lower extremity noninvasive vascular studies.  Since his peripheral angiography, he has done reasonably well.  He did note bruising in his right groin which has extended down his leg and into his medial thigh.  He has not had any significant swelling or notice of hematoma.  His left wrist is healed up well.  He continues to walk around a track near his home regularly.  Though he has never paid attention to it before, he now thinks that in retrospect, he has been experiencing mild right thigh burning after walking about 1/4-1/3 of a mile.  He will stop and rest at that point and symptoms resolved within a few minutes.  Previously, he thought it was old age.  He has had some right upper buttocks/lower back discomfort that is worsened by walking on hard surfaces and he believes this is secondary to arthritis.  He denies chest pain, palpitations, dyspnea, PND, orthopnea, dizziness, syncope, edema, or early satiety.  Home Medications    Prior to Admission medications   Medication Sig Start Date End Date Taking? Authorizing Provider  acetaminophen (TYLENOL) 500 MG tablet Take 500 mg by mouth every 8 (eight) hours as needed for mild pain or moderate pain.     [provider]  allopurinol (ZYLOPRIM) 100 MG tablet Take 100 mg by mouth 2 (two) times daily.  01/22/16   [provider]  amLODipine (NORVASC) 10 MG tablet Take 1 tablet (10 mg total) by mouth daily. 11/28/17 12/23/18  Wellington Hampshire, MD  aspirin 81 MG EC tablet Take 1 tablet (81 mg total) by mouth daily. 12/29/18   Wellington Hampshire, MD  atorvastatin (LIPITOR) 40 MG tablet Take 1 tablet (40 mg total) by mouth daily. 02/26/16   Nani Skillern, PA-C  carvedilol (COREG) 25 MG tablet Take 1 tablet (25 mg total) 2 (two) times daily with a meal by mouth. 01/31/17 12/18/18  Wellington Hampshire, MD  glipiZIDE (GLUCOTROL XL) 2.5 MG 24 hr tablet Take 2.5 mg by mouth daily.  01/22/16   [provider]  losartan (COZAAR) 50 MG tablet Take 50 mg by mouth daily.    [provider]  omega-3 acid ethyl esters (LOVAZA) 1 g capsule Take 1 capsule (1 g total) by mouth 2 (two) times daily. Patient taking differently: Take 1 g by mouth daily.  02/26/16   Nani Skillern, PA-C    Review of Systems    Right medial thigh bruising which seems to be improving.  Some right thigh claudication after walking about 1/4-1/3 of a mile.  He denies chest pain, palpitations, dyspnea, PND, orthopnea, dizziness, syncope, edema, or early satiety.  All other systems reviewed and are otherwise negative except as noted above.  Physical Exam    VS:  BP 140/70 (BP Location: Left Arm, Patient Position: Sitting, Cuff Size: Normal)    Pulse 68    Ht 6\' 1"  (1.854 m)    Wt 242 lb 12 oz (110.1 kg)    SpO2 96%    BMI 32.03 kg/m  , BMI Body mass index  is 32.03 kg/m. GEN: Well nourished, well developed, in no acute distress. HEENT: normal. Neck: Supple, no JVD, carotid bruits, or masses. Cardiac: RRR, 2/6 systolic ejection murmur loudest at the right upper sternal border, no rubs, or gallops. No clubbing, cyanosis, edema.  Right groin site is moderately ecchymotic extending down to the medial thigh without bleeding, bruit, or hematoma.  Left radial is without bleeding, bruit, or hematoma.  Radials/PT 2+ and equal bilaterally.  Respiratory:  Respirations regular and unlabored, clear to auscultation bilaterally. GI: Soft, nontender, nondistended, BS + x 4. MS: no deformity or atrophy. Skin: warm and dry, no rash. Neuro:  Strength and sensation are intact. Psych: Normal affect.  Accessory Clinical Findings    ECG personally reviewed by me today -regular sinus rhythm, 68, inferior T wave inversion - no acute changes.  Lab Results  Component Value Date   WBC 10.9 (H) 12/23/2018   HGB 13.8 12/23/2018   HCT 42.2 12/23/2018   MCV 90.8 12/23/2018   PLT 190 12/23/2018   Lab  Results  Component Value Date   CREATININE 2.28 (H) 12/23/2018   BUN 35 (H) 12/23/2018   NA 140 12/23/2018   K 4.1 12/23/2018   CL 105 12/23/2018   CO2 24 12/23/2018   Lab Results  Component Value Date   ALT 29 02/23/2016   AST 18 02/23/2016   ALKPHOS 78 02/23/2016   BILITOT 0.7 02/23/2016   Lab Results  Component Value Date   CHOL 105 02/13/2016   HDL 21 (L) 02/13/2016   LDLCALC 37 02/13/2016   TRIG 173 (H) 02/20/2016   CHOLHDL 5.0 02/13/2016     Assessment & Plan    1.  Peripheral arterial disease: Status post renal and peripheral angiography revealing moderate bilateral renal artery stenosis and severe, 99% subtotal occlusion of the right common iliac artery and 80% stenosis of the left common iliac artery.  He says today that in retrospect, he does believe that he has been having some right thigh claudication after walking about 1/4-1/3 of a mile.  We discussed the findings on his angiogram and will arrange for lower extremity arterial duplex with ABIs along with [facility].  He remains on aspirin and statin therapy.  2.  Renal artery stenosis: Moderate, left greater than right renal artery stenosis noted on recent angiogram.  This will be medically managed.  Continue aspirin and statin.  3.  Essential hypertension: Blood pressure elevated at 140/70 today.  He says he was 134/76 at home this morning.  Renal artery stenosis not significant enough to require intervention.  Continue amlodipine, carvedilol, and losartan.  Can consider further titration of losartan if renal function stable-check basic metabolic panel today.  4.  Hyperlipidemia: Remains on statin and Lovaza.  Last LDL I can see in our system was 37 in November 2017.  This has since been followed by primary care.  5.  Type 2 diabetes mellitus: On glipizide and followed by primary care.  6.  Stage III chronic kidney disease: Follow-up basic metabolic panel today in the  setting of recent contrast.  7.  Disposition: Follow-up lower extremity arterial duplex and ABIs.  Follow-up in clinic in 1 month or sooner if necessary.  Nicolasa Duckinghristopher Abeer Deskins, NP 12/31/2018, 12:16 PM

## 2019-01-01 LAB — BASIC METABOLIC PANEL
BUN/Creatinine Ratio: 19 (ref 10–24)
BUN: 37 mg/dL — ABNORMAL HIGH (ref 8–27)
CO2: 21 mmol/L (ref 20–29)
Calcium: 9.3 mg/dL (ref 8.6–10.2)
Chloride: 104 mmol/L (ref 96–106)
Creatinine, Ser: 1.96 mg/dL — ABNORMAL HIGH (ref 0.76–1.27)
GFR calc Af Amer: 38 mL/min/{1.73_m2} — ABNORMAL LOW (ref >59)
GFR calc non Af Amer: 33 mL/min/{1.73_m2} — ABNORMAL LOW (ref >59)
Glucose: 192 mg/dL — ABNORMAL HIGH (ref 65–99)
Potassium: 4.6 mmol/L (ref 3.5–5.2)
Sodium: 142 mmol/L (ref 134–144)

## 2019-02-09 ENCOUNTER — Ambulatory Visit (INDEPENDENT_AMBULATORY_CARE_PROVIDER_SITE_OTHER): Payer: Medicare Other

## 2019-02-09 ENCOUNTER — Other Ambulatory Visit: Payer: Self-pay

## 2019-02-09 DIAGNOSIS — I739 Peripheral vascular disease, unspecified: Secondary | ICD-10-CM

## 2019-02-16 ENCOUNTER — Ambulatory Visit (INDEPENDENT_AMBULATORY_CARE_PROVIDER_SITE_OTHER): Payer: Medicare Other | Admitting: Cardiovascular Disease

## 2019-02-16 ENCOUNTER — Other Ambulatory Visit: Payer: Self-pay

## 2019-02-16 ENCOUNTER — Encounter: Payer: Self-pay | Admitting: Cardiovascular Disease

## 2019-02-16 VITALS — BP 150/90 | HR 78 | Temp 98.1°F | Ht 73.0 in | Wt 246.0 lb

## 2019-02-16 DIAGNOSIS — I2510 Atherosclerotic heart disease of native coronary artery without angina pectoris: Secondary | ICD-10-CM | POA: Diagnosis not present

## 2019-02-16 DIAGNOSIS — I10 Essential (primary) hypertension: Secondary | ICD-10-CM

## 2019-02-16 DIAGNOSIS — I701 Atherosclerosis of renal artery: Secondary | ICD-10-CM | POA: Diagnosis not present

## 2019-02-16 DIAGNOSIS — I5032 Chronic diastolic (congestive) heart failure: Secondary | ICD-10-CM

## 2019-02-16 DIAGNOSIS — I739 Peripheral vascular disease, unspecified: Secondary | ICD-10-CM

## 2019-02-16 NOTE — Patient Instructions (Signed)
Medication Instructions:  Your physician recommends that you continue on your current medications as directed. Please refer to the Current Medication list given to you today.  *If you need a refill on your cardiac medications before your next appointment, please call your pharmacy*  Lab Work: None ordered If you have labs (blood work) drawn today and your tests are completely normal, you will receive your results only by: Marland Kitchen MyChart Message (if you have MyChart) OR . A paper copy in the mail If you have any lab test that is abnormal or we need to change your treatment, we will call you to review the results.  Testing/Procedures: None ordered  Follow-Up: At [facility], you and your health needs are our priority.  As part of our continuing mission to provide you with exceptional heart care, we have created designated Provider Care Teams.  These Care Teams include your primary Cardiologist (physician) and Advanced Practice Providers (APPs -  Physician Assistants and Nurse Practitioners) who all work together to provide you with the care you need, when you need it.  Your next appointment:   6 week(s)  The format for your next appointment:   In Person  Provider:    You may see Kathlyn Sacramento, MD or one of the following Advanced Practice Providers on your designated Care Team:    Murray Hodgkins, NP  Christell Faith, PA-C  Marrianne Mood, PA-C   Other Instructions N/A

## 2019-02-16 NOTE — Progress Notes (Signed)
Cardiology Office Note   Date:  02/16/2019   ID:  James PaceJames R Burkey, DOB 1945-07-07, MRN [id number]  PCP:  Marguarite ArbourSparks, Jeffrey D, MD  Cardiologist:   Lorine BearsMuhammad Glorian Mcdonell, MD   Chief Complaint  Patient presents with  . office visit    1 mo F/U after testing; Meds verbally reviewed with patient.      History of Present Illness: James Clayton is a 73 y.o. male who presents for a follow-up visit regarding coronary artery disease and peripheral arterial disease. He has chronic medical conditions that include hypertension, hyperlipidemia, type 2 diabetes, stage III chronic kidney disease and obesity. 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 Cardiac catheterization showed severe three-vessel and left main disease. Echo showed normal LV systolic function.  He has known history of refractory hypertension.  Renal artery duplex was suggestive of possible bilateral renal artery stenosis.  I proceeded with angiography in October which showed moderate bilateral renal artery stenosis estimated to be 60% bilaterally.  During angiography, the patient was found to have bilateral common iliac artery stenosis much worse on the right side. The patient underwent recent vascular testing which showed an ABI of 0.69 on the right and normal on the left.  There was no significant infrainguinal disease. He has been doing reasonably well.  He describes stable chest pain which has been present since his CABG and thought to be musculoskeletal.  No worsening shortness of breath.  He does report right hip discomfort with walking but reports that he is not limited by this. He developed a small ulceration on the bottom of the right foot and currently seeing Dr. Orland Jarredroxler.   Past Medical History:  Diagnosis Date  . CAD (coronary artery disease)    a. LHC 02/12/16: ostLM-LM 90% severely calcified, ost-mLAD 90%, ostRamus-Ramus 80%, ostLCx-pLCx 90%, ostRCA-pRCA 100%  severely calcified, LVEDP severely elevated; b. 01/2016 CABG x 3 (LIMA->LAD, VG->PDA, VG->RI).   . Chronic diastolic CHF (congestive heart failure) (HCC)    a. echo 02/10/16: EF 50-55%, mild inf HK, GR2DD, mild to mod MR, PASP 46 mmHg; b. 04/2018 Echo: EF >55%. Triv AI. Mild TR.  . CKD (chronic kidney disease), stage III   . Diabetes mellitus with complication (HCC)   . HLD (hyperlipidemia)   . Hypertensive heart disease   . Ileus (HCC)    a. 02/2016 following CABG.  . Morbid obesity (HCC)   . PAD (peripheral artery disease) (HCC)    a. 12/2018 Periph Angio: RCIA 99p, LCIA 80p.  Marland Kitchen. Post-operative Atrial Fibrillation    a. 01/2016 following CABG.  . Renal artery stenosis (HCC)    a. 12/2018 Renal angio: RRA 50-60, LRA 60-->Med Rx.    Past Surgical History:  Procedure Laterality Date  . CARDIAC CATHETERIZATION N/A 02/12/2016   Procedure: Left Heart Cath and Coronary Angiography;  Surgeon: Iran OuchMuhammad A Dannon Nguyenthi, MD;  Location: [facility] INVASIVE CV LAB;  Service: Cardiovascular;  Laterality: N/A;  . COLONOSCOPY WITH PROPOFOL N/A 12/27/2016   Procedure: COLONOSCOPY WITH PROPOFOL;  Surgeon: Scot JunElliott, Robert T, MD;  [facility];  Service: Endoscopy;  Laterality: N/A;  . CORONARY ARTERY BYPASS GRAFT N/A 02/13/2016   Procedure: CORONARY ARTERY BYPASS GRAFTING (CABG)x 3 with endoscopic harvesting of right saphenous  vein LIMA to LAD SVG to Ramus SVG to PDA;  Surgeon: Kerin PernaPeter Van Trigt, MD;  [facility];  Service: Open Heart Surgery;  Laterality: N/A;  . JOINT REPLACEMENT    .  RENAL ANGIOGRAPHY N/A 12/23/2018   Procedure: RENAL ANGIOGRAPHY;  Surgeon: [name], MD;  Location: [facility] INVASIVE CV LAB;  Service: Cardiovascular;  Laterality: N/A;  . TEE WITHOUT CARDIOVERSION N/A 02/13/2016   Procedure: TRANSESOPHAGEAL ECHOCARDIOGRAM (TEE);  Surgeon: [name], MD;  Location: [facility] OR;  Service: Open Heart Surgery;  Laterality: N/A;     Current Outpatient Medications  Medication Sig  Dispense Refill  . acetaminophen (TYLENOL) 500 MG tablet Take 500 mg by mouth every 8 (eight) hours as needed for mild pain or moderate pain.     Marland Kitchen allopurinol (ZYLOPRIM) 100 MG tablet Take 100 mg by mouth 2 (two) times daily.   12  . amLODipine (NORVASC) 10 MG tablet Take 1 tablet (10 mg total) by mouth daily. 90 tablet 3  . aspirin 81 MG EC tablet Take 1 tablet (81 mg total) by mouth daily.    Marland Kitchen atorvastatin (LIPITOR) 40 MG tablet Take 1 tablet (40 mg total) by mouth daily. 30 tablet 1  . carvedilol (COREG) 25 MG tablet Take 1 tablet (25 mg total) 2 (two) times daily with a meal by mouth. 180 tablet 1  . gabapentin (NEURONTIN) 300 MG capsule Take 300 mg by mouth 2 (two) times daily.    Marland Kitchen glipiZIDE (GLUCOTROL XL) 2.5 MG 24 hr tablet Take 2.5 mg by mouth daily.  11  . losartan (COZAAR) 50 MG tablet Take 50 mg by mouth daily.    Marland Kitchen omega-3 acid ethyl esters (LOVAZA) 1 g capsule Take 1 capsule (1 g total) by mouth 2 (two) times daily. (Patient taking differently: Take 1 g by mouth daily. )    . OZEMPIC, 0.25 OR 0.5 MG/DOSE, 2 MG/1.5ML SOPN Inject 0.5 mg into the skin once a week.     No current facility-administered medications for this visit.     Allergies:   Penicillin g    Social History:  The patient  reports that he has quit smoking. He has never used smokeless tobacco. He reports that he does not drink alcohol or use drugs.   Family History:  The patient's family history includes Cancer in his mother; Heart disease in his father.    ROS:  Please see the history of present illness.   Otherwise, review of systems are positive for none.   All other systems are reviewed and negative.    PHYSICAL EXAM: VS:  BP (!) 150/90 (BP Location: Left Arm, Patient Position: Sitting, Cuff Size: Normal)   Pulse 78   Temp 98.1 F (36.7 C)   Ht 6\' 1"  (1.854 m)   Wt 246 lb (111.6 kg) Comment: wearing boot on right foot  SpO2 97%   BMI 32.46 kg/m  , BMI Body mass index is 32.46 kg/m. GEN: Well  nourished, well developed, in no acute distress  HEENT: normal  Neck: no JVD, carotid bruits, or masses Cardiac: RRR; no  rubs, or gallops. 2/ 6 systolic ejection murmur at the base. There is mild bilateral leg edema Respiratory:  clear to auscultation bilaterally, normal work of breathing GI: soft, nontender, nondistended, + BS MS: no deformity or atrophy  Skin: warm and dry, no rash Neuro:  Strength and sensation are intact Psych: euthymic mood, full affect The right foot is wrapped.  There is a small superficial ulceration on the bottom of the right foot close to the base of the second toe.  This is few millimeters in diameter.   EKG:  EKG is ordered today. The ekg ordered today  demonstrates normal sinus rhythm with lateral T wave changes suggestive of ischemia.   Recent Labs: 12/23/2018: Hemoglobin 13.8; Platelets 190 12/31/2018: BUN 37; Creatinine, Ser 1.96; Potassium 4.6; Sodium 142    Lipid Panel    Component Value Date/Time   CHOL 105 02/13/2016 0508   TRIG 173 (H) 02/20/2016 0440   HDL 21 (L) 02/13/2016 0508   CHOLHDL 5.0 02/13/2016 0508   VLDL 47 (H) 02/13/2016 0508   LDLCALC 37 02/13/2016 0508      Wt Readings from Last 3 Encounters:  02/16/19 246 lb (111.6 kg)  12/31/18 242 lb 12 oz (110.1 kg)  12/23/18 241 lb (109.3 kg)       PAD Screen 03/07/2016  Previous PAD dx? No  Previous surgical procedure? No  Pain with walking? Yes  Subsides with rest? Yes  Feet/toe relief with dangling? No  Painful, non-healing ulcers? No  Extremities discolored? No      ASSESSMENT AND PLAN:  1.  Moderate bilateral renal artery stenosis: This was not severe enough by angiography to require revascularization.  Continue medical therapy.    2. Coronary artery disease involving native coronary arteries without angina: He is doing extremely well with no anginal symptoms. Continue medical therapy.  3. Essential hypertension: Blood pressure is reasonably controlled on current  medications.  4. Hyperlipidemia: Continue treatment with atorvastatin 40 mg daily.   5.  Peripheral arterial disease: The patient was found recently to have bilateral common iliac artery disease much worse on the right side than the left side.  ABI was normal on the left but 0.69 on the right.  He does have right hip and thigh claudication but he reports that this has been present for 2 years with no progression and does not significantly interfere with his activities of daily living.  In addition, he does have a small ulceration on the bottom of the right foot.  I explained to him that if the ulceration does not heal or if it worsens, there is an indication for revascularization to improve blood flow.  He will continue to follow-up with Dr. Elvina Mattes and I will have him follow-up with me in 6 weeks to reevaluate.  I instructed him to notify me if there is worsening before then.   Disposition:   FU with me in 6 weeks  Signed,  Kathlyn Sacramento, MD  02/16/2019 3:50 PM    [place]

## 2019-03-31 ENCOUNTER — Other Ambulatory Visit: Payer: Self-pay

## 2019-03-31 ENCOUNTER — Encounter: Payer: Self-pay | Admitting: Nurse Practitioner

## 2019-03-31 ENCOUNTER — Ambulatory Visit (INDEPENDENT_AMBULATORY_CARE_PROVIDER_SITE_OTHER): Payer: Medicare Other | Admitting: Nurse Practitioner

## 2019-03-31 VITALS — BP 164/80 | HR 83 | Ht 73.0 in | Wt 244.5 lb

## 2019-03-31 DIAGNOSIS — E785 Hyperlipidemia, unspecified: Secondary | ICD-10-CM

## 2019-03-31 DIAGNOSIS — I2510 Atherosclerotic heart disease of native coronary artery without angina pectoris: Secondary | ICD-10-CM

## 2019-03-31 DIAGNOSIS — I739 Peripheral vascular disease, unspecified: Secondary | ICD-10-CM | POA: Diagnosis not present

## 2019-03-31 DIAGNOSIS — N183 Chronic kidney disease, stage 3 unspecified: Secondary | ICD-10-CM

## 2019-03-31 DIAGNOSIS — I10 Essential (primary) hypertension: Secondary | ICD-10-CM | POA: Diagnosis not present

## 2019-03-31 DIAGNOSIS — I701 Atherosclerosis of renal artery: Secondary | ICD-10-CM

## 2019-03-31 NOTE — Progress Notes (Signed)
Office Visit    Patient Name: James Clayton Date of Encounter: 03/31/2019  Primary Care Provider:  [name], MD Primary Cardiologist:  [name], MD  Chief Complaint    74 year old male with a history of CAD status post CABG in 2017, hypertension, hyperlipidemia, type 2 diabetes mellitus, stage III chronic kidney disease, obesity, moderate renal artery stenosis, and peripheral arterial disease including severe bilateral common iliac disease, who presents for follow-up of PAD.  Past Medical History    Past Medical History:  Diagnosis Date  . CAD (coronary artery disease)    a. LHC 02/12/16: ostLM-LM 90% severely calcified, ost-mLAD 90%, ostRamus-Ramus 80%, ostLCx-pLCx 90%, ostRCA-pRCA 100% severely calcified, LVEDP severely elevated; b. 01/2016 CABG x 3 (LIMA->LAD, VG->PDA, VG->RI).   . Chronic diastolic CHF (congestive heart failure) (HCC)    a. echo 02/10/16: EF 50-55%, mild inf HK, GR2DD, mild to mod MR, PASP 46 mmHg; b. 04/2018 Echo: EF >55%. Triv AI. Mild TR.  . CKD (chronic kidney disease), stage III   . Diabetes mellitus with complication (HCC)   . HLD (hyperlipidemia)   . Hypertensive heart disease   . Ileus (HCC)    a. 02/2016 following CABG.  . Morbid obesity (HCC)   . PAD (peripheral artery disease) (HCC)    a. 12/2018 Periph Angio: RCIA 99p, LCIA 80p; b. 01/2019 ABI/Ultrasound: R 0.69, L 1.06. Abnl waveforms on R (known dzs)->Med mgmt in setting of absence of Ss.  Marland Kitchen Post-operative Atrial Fibrillation    a. 01/2016 following CABG.  . Renal artery stenosis (HCC)    a. 12/2018 Renal angio: RRA 50-60, LRA 60-->Med Rx.   Past Surgical History:  Procedure Laterality Date  . CARDIAC CATHETERIZATION N/A 02/12/2016   Procedure: Left Heart Cath and Coronary Angiography;  Surgeon: [name], MD;  Location: [facility] INVASIVE CV LAB;  Service: Cardiovascular;  Laterality: N/A;  . COLONOSCOPY WITH PROPOFOL N/A 12/27/2016   Procedure: COLONOSCOPY WITH  PROPOFOL;  Surgeon: [name], MD;  Location[facility] ENDOSCOPY;  Service: Endoscopy;  Laterality: N/A;  . CORONARY ARTERY BYPASS GRAFT N/A 02/13/2016   Procedure: CORONARY ARTERY BYPASS GRAFTING (CABG)x 3 with endoscopic harvesting of right saphenous  vein LIMA to LAD SVG to Ramus SVG to PDA;  Surgeon: [name], MD;  Location: [facility] OR;  Service: Open Heart Surgery;  Laterality: N/A;  . JOINT REPLACEMENT    . RENAL ANGIOGRAPHY N/A 12/23/2018   Procedure: RENAL ANGIOGRAPHY;  Surgeon: [name], MD;  Location: [facility] INVASIVE CV LAB;  Service: Cardiovascular;  Laterality: N/A;  . TEE WITHOUT CARDIOVERSION N/A 02/13/2016   Procedure: TRANSESOPHAGEAL ECHOCARDIOGRAM (TEE);  Surgeon: [name], MD;  Location: [facility] OR;  Service: Open Heart Surgery;  Laterality: N/A;    Allergies  Allergies  Allergen Reactions  . Penicillin G     Other reaction(s): Other (See Comments) Passed out at age of 60    History of Present Illness    74 year old male with the above complex past medical history including CAD status post CABG x3 in November 2017, after presenting with a non-STEMI.  Catheterization at that time showed severe three-vessel and left main disease.  Other history includes hypertension, hyperlipidemia, type 2 diabetes mellitus, stage III chronic kidney disease, renal artery stenosis, and bilateral iliac disease.  Most recent echocardiogram was performed in the setting of systolic murmur in February 2020 and showed normal LV function with trivial AI mild TR.  Due to difficult to manage hypertension despite  3 medications and chronic kidney disease, he underwent renal artery duplex in September 2020 which suggested greater than 60% bilateral renal artery stenosis.  Subsequently underwent abdominal aortic angiography with finding of 50-60% right renal artery stenosis and 60% left renal artery stenosis.  He was also noted to have severe, subtotal occlusive disease of the right common iliac  artery (99%), and also severe stenosis of the left common iliac artery (80%).  Follow-up arterial brachial indices and lower extremity arterial duplex did not suggest any significant disease further down the legs, and in the setting of an absence of claudication, medical therapy was recommended.  He was last seen in clinic on December 1, at which time he reported right hip pain and thigh claudication that has been present for 2 years with no progression.  It was not significantly interfering with activities of daily living.  He also noted a small ulceration on the bottom of his right foot, which was being managed by podiatry.  He was advised that if he develops worsening claudication or insufficient healing of the ulceration, peripheral intervention may be required.  Fortunately, his right foot ulceration has healed well.  He continues to note right hip discomfort if he walks for long period of time, especially on hard surfaces.  He is much less likely to have hip pain when walking on his property, which he does frequently throughout the day.  He typically gets between 5000 and 10,000 steps a day.  He is chronic, stable dyspnea on exertion and denies chest pain, palpitations, PND, orthopnea, dizziness, syncope, or early satiety.  He does sometimes note mild edema above his bilateral sock lines.  He has noticed that his blood pressure has been running higher at home over the past few months.  Home Medications    Prior to Admission medications   Medication Sig Start Date End Date Taking? Authorizing Provider  acetaminophen (TYLENOL) 500 MG tablet Take 500 mg by mouth every 8 (eight) hours as needed for mild pain or moderate pain.     [provider]  allopurinol (ZYLOPRIM) 100 MG tablet Take 100 mg by mouth 2 (two) times daily.  01/22/16   [provider]  amLODipine (NORVASC) 10 MG tablet Take 1 tablet (10 mg total) by mouth daily. 11/28/17 02/16/28  Wellington Hampshire, MD  aspirin 81 MG EC  tablet Take 1 tablet (81 mg total) by mouth daily. 12/29/18   Wellington Hampshire, MD  atorvastatin (LIPITOR) 40 MG tablet Take 1 tablet (40 mg total) by mouth daily. 02/26/16   Nani Skillern, PA-C  carvedilol (COREG) 25 MG tablet Take 1 tablet (25 mg total) 2 (two) times daily with a meal by mouth. 01/31/17 12/31/27  Wellington Hampshire, MD  gabapentin (NEURONTIN) 300 MG capsule Take 300 mg by mouth 2 (two) times daily.    [provider]  glipiZIDE (GLUCOTROL XL) 2.5 MG 24 hr tablet Take 2.5 mg by mouth daily. 01/22/16   [provider]  losartan (COZAAR) 50 MG tablet Take 50 mg by mouth daily.    [provider]  omega-3 acid ethyl esters (LOVAZA) 1 g capsule Take 1 capsule (1 g total) by mouth 2 (two) times daily. Patient taking differently: Take 1 g by mouth daily.  02/26/16   Lars Pinks M, PA-C  OZEMPIC, 0.25 OR 0.5 MG/DOSE, 2 MG/1.5ML SOPN Inject 0.5 mg into the skin once a week. 01/28/19   [provider]    Review of Systems  Chronic, stable dyspnea on exertion.  Right hip pain that occurs with prolonged walking, especially on hard surfaces.  He denies chest pain, palpitations, PND, orthopnea, dizziness, syncope, or early satiety.  He does sometimes note mild edema above his sock line.  All other systems reviewed and are otherwise negative except as noted above.  Physical Exam    VS:  BP (!) 164/80 (BP Location: Left Arm, Patient Position: Sitting, Cuff Size: Normal)   Pulse 83   Ht 6\' 1"  (1.854 m)   Wt 244 lb 8 oz (110.9 kg)   SpO2 96%   BMI 32.26 kg/m  , BMI Body mass index is 32.26 kg/m. GEN: Well nourished, well developed, in no acute distress. HEENT: normal. Neck: Supple, no JVD, carotid bruits, or masses. Cardiac: RRR, 1/6 systolic ejection murmur at the upper sternal borders, no rubs, or gallops. No clubbing, cyanosis, trace bilateral ankle edema at the sock lines.  Radials/DP/PT 2+ and equal bilaterally.  Plantar surface  of the right foot notable for a healing ulceration without drainage.  Small area of ecchymosis noted just proximal to the ulceration. Respiratory:  Respirations regular and unlabored, clear to auscultation bilaterally. GI: Soft, nontender, nondistended, BS + x 4. MS: no deformity or atrophy. Skin: warm and dry, no rash. Neuro:  Strength and sensation are intact. Psych: Normal affect.  Accessory Clinical Findings    ECG personally reviewed by me today -regular sinus rhythm, 87, prior inferior infarct, nonspecific T changes- no acute changes.  Lab Results  Component Value Date   WBC 10.9 (H) 12/23/2018   HGB 13.8 12/23/2018   HCT 42.2 12/23/2018   MCV 90.8 12/23/2018   PLT 190 12/23/2018   Lab Results  Component Value Date   CREATININE 1.96 (H) 12/31/2018   BUN 37 (H) 12/31/2018   NA 142 12/31/2018   K 4.6 12/31/2018   CL 104 12/31/2018   CO2 21 12/31/2018   Lab Results  Component Value Date   ALT 29 02/23/2016   AST 18 02/23/2016   ALKPHOS 78 02/23/2016   BILITOT 0.7 02/23/2016   Lab Results  Component Value Date   CHOL 105 02/13/2016   HDL 21 (L) 02/13/2016   LDLCALC 37 02/13/2016   TRIG 173 (H) 02/20/2016   CHOLHDL 5.0 02/13/2016    Assessment & Plan    1.  Coronary artery disease: Patient has been stable without chest pain.  He does experience chronic, stable dyspnea on exertion.  He is walking 5000-10,000 steps per day.  He remains on aspirin, statin, beta-blocker, and ARB therapy.  Titrating ARB in the setting of hypertension today.  2.  Peripheral arterial disease: Peripheral angiography in October 2020 revealed a 99% subtotal occlusion of the right common iliac artery and an 80% stenosis of the left common iliac artery.  Follow-up ABIs were 0.69 and 1.06 respectively.  No evidence of significant distal disease.  He has chronic right hip pain that occurs with walking and is worse when walking on harder surfaces, possibly suggesting a more arthritic component.   Right foot ulceration (plantar surface) has essentially healed.  He continues to walk around his property on a daily basis and typically gets between 5000 and 10,000 steps a day.  I encouraged him to continue walking.  He remains on aspirin and statin therapy.  3.  Renal artery stenosis: Moderate, left greater than right renal artery stenosis noted on angiogram in October 2020.  Plan for medical management with aspirin and statin therapy.  4.  Essential hypertension: Blood pressure has been trending higher at home recently and is elevated today at 164/80.  Increasing losartan to 100 mg daily.  Follow-up basic metabolic panel in 1 week.  He otherwise remains on carvedilol and amlodipine.  5.  Stage III chronic kidney disease: Creatinine was 1.96 on October 15.  Follow-up basic metabolic panel next week.  Continue ARB therapy.  6.  Hyperlipidemia: This has been followed by primary care.  He is on Lovaza and atorvastatin and says that he is also enrolled in a lipid-lowering trial and takes a study medication twice a day.  He does have frequent blood work in the setting of that trial.  7.  Type 2 diabetes mellitus: Followed by primary care.  He is on glipizide and semaglutide therapy.  8.  Disposition: Follow-up basic metabolic panel in 1 week in the setting of losartan titration.  Follow-up in clinic in 3 months or sooner if necessary.   Nicolasa Ducking, NP 03/31/2019, 9:18 AM

## 2019-03-31 NOTE — Patient Instructions (Addendum)
  Medication Instructions:  Increase Losartan to 100 mg daily.  *If you need a refill on your cardiac medications before your next appointment, please call your pharmacy*  Lab Work:  [facility] [place] [facility] [place] [facility]  The medical mall is open Monday through Friday 7am-6pm.  You do not need an appointment.  Your order has already been placed.  If you have labs (blood work) drawn today and your tests are completely normal, you will receive your results only by: Marland Kitchen MyChart Message (if you have MyChart) OR . A paper copy in the mail If you have any lab test that is abnormal or we need to change your treatment, we will call you to review the results.  Testing/Procedures: None ordered  Follow-Up: At [facility], you and your health needs are our priority.  As part of our continuing mission to provide you with exceptional heart care, we have created designated Provider Care Teams.  These Care Teams include your primary Cardiologist (physician) and Advanced Practice Providers (APPs -  Physician Assistants and Nurse Practitioners) who all work together to provide you with the care you need, when you need it.  Your next appointment:   3 month(s)  The format for your next appointment:   In Person  Provider:   [name], MD  Other Instructions   We are recommending the COVID-19 vaccine to all of our patients. Cardiac medications (including blood thinners) should not deter anyone from being vaccinated and there is no need to hold any of those medications prior to vaccine administration.     Currently, there is a hotline to call (active 03/26/19) to schedule vaccination appointments as no walk-ins will be accepted.   Number: [phone]    If you have further questions or concerns about the vaccine process, please visit [email or web address] or contact your primary care physician.

## 2019-04-06 ENCOUNTER — Other Ambulatory Visit: Payer: Self-pay

## 2019-04-06 ENCOUNTER — Other Ambulatory Visit
Admission: RE | Admit: 2019-04-06 | Discharge: 2019-04-06 | Disposition: A | Discharge status: Home / Self Care | Payer: Medicare Other | Source: Ambulatory Visit | Attending: Nurse Practitioner | Admitting: Nurse Practitioner

## 2019-04-06 DIAGNOSIS — I739 Peripheral vascular disease, unspecified: Secondary | ICD-10-CM | POA: Diagnosis present

## 2019-04-06 DIAGNOSIS — I2510 Atherosclerotic heart disease of native coronary artery without angina pectoris: Secondary | ICD-10-CM

## 2019-04-06 DIAGNOSIS — I10 Essential (primary) hypertension: Secondary | ICD-10-CM | POA: Insufficient documentation

## 2019-04-06 LAB — BASIC METABOLIC PANEL
Anion gap: 11 (ref 5–15)
BUN: 28 mg/dL — ABNORMAL HIGH (ref 8–23)
CO2: 25 mmol/L (ref 22–32)
Calcium: 9.3 mg/dL (ref 8.9–10.3)
Chloride: 106 mmol/L (ref 98–111)
Creatinine, Ser: 1.89 mg/dL — ABNORMAL HIGH (ref 0.61–1.24)
GFR calc Af Amer: 40 mL/min — ABNORMAL LOW (ref >60)
GFR calc non Af Amer: 34 mL/min — ABNORMAL LOW (ref >60)
Glucose, Bld: 150 mg/dL — ABNORMAL HIGH (ref 70–99)
Potassium: 4.4 mmol/L (ref 3.5–5.1)
Sodium: 142 mmol/L (ref 135–145)

## 2019-07-01 ENCOUNTER — Ambulatory Visit (INDEPENDENT_AMBULATORY_CARE_PROVIDER_SITE_OTHER): Payer: Medicare Other | Admitting: Cardiovascular Disease

## 2019-07-01 ENCOUNTER — Other Ambulatory Visit: Payer: Self-pay

## 2019-07-01 ENCOUNTER — Encounter: Payer: Self-pay | Admitting: Cardiovascular Disease

## 2019-07-01 VITALS — BP 136/68 | HR 83 | Ht 73.0 in | Wt 244.8 lb

## 2019-07-01 DIAGNOSIS — I10 Essential (primary) hypertension: Secondary | ICD-10-CM | POA: Diagnosis not present

## 2019-07-01 DIAGNOSIS — I739 Peripheral vascular disease, unspecified: Secondary | ICD-10-CM | POA: Diagnosis not present

## 2019-07-01 DIAGNOSIS — I701 Atherosclerosis of renal artery: Secondary | ICD-10-CM

## 2019-07-01 DIAGNOSIS — E782 Mixed hyperlipidemia: Secondary | ICD-10-CM

## 2019-07-01 DIAGNOSIS — I2510 Atherosclerotic heart disease of native coronary artery without angina pectoris: Secondary | ICD-10-CM | POA: Diagnosis not present

## 2019-07-01 NOTE — Patient Instructions (Signed)

## 2019-07-01 NOTE — Progress Notes (Signed)
Cardiology Office Note   Date:  07/01/2019   ID:  James Clayton, DOB [birth year], MRN [id number]  PCP:  [name], MD  Cardiologist:   [name], MD   Chief Complaint  Patient presents with  . other    3 mth f/u      History of Present Illness: James Clayton is a 74 y.o. male who presents for a follow-up visit regarding coronary artery disease and peripheral arterial disease. He has chronic medical conditions that include hypertension, hyperlipidemia, type 2 diabetes, stage III chronic kidney disease and obesity. 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 Cardiac catheterization showed severe three-vessel and left main disease. Echo showed normal LV systolic function.  He has known history of refractory hypertension.  Renal artery duplex was suggestive of possible bilateral renal artery stenosis.  Angiography in October 2020 showed moderate bilateral renal artery stenosis estimated to be 60% bilaterally.  During angiography, the patient was found to have bilateral common iliac artery stenosis much worse on the right side. Noninvasive vascular testing showed an ABI of 0.69 on the right and normal on the left.  There was no significant infrainguinal disease. He has been doing reasonably well.  He has chronic atypical chest pain since CABG thought to be musculoskeletal.  In addition, he had recent tightness that correlated with his allergies.   No worsening shortness of breath.  He continues to have the significant right hip discomfort with walking after about 1/10 of a mile and is also limited by bilateral feet discomfort.   He had a small ulceration on the bottom of the right foot and was treated by Dr. Orland Jarred with subsequent improvement.  He currently has a callus in that area.     Past Medical History:  Diagnosis Date  . CAD (coronary artery disease)    a. LHC 02/12/16: ostLM-LM 90% severely calcified,  ost-mLAD 90%, ostRamus-Ramus 80%, ostLCx-pLCx 90%, ostRCA-pRCA 100% severely calcified, LVEDP severely elevated; b. 01/2016 CABG x 3 (LIMA->LAD, VG->PDA, VG->RI).   . Chronic diastolic CHF (congestive heart failure) (HCC)    a. echo 02/10/16: EF 50-55%, mild inf HK, GR2DD, mild to mod MR, PASP 46 mmHg; b. 04/2018 Echo: EF >55%. Triv AI. Mild TR.  . CKD (chronic kidney disease), stage III   . Diabetes mellitus with complication (HCC)   . HLD (hyperlipidemia)   . Hypertensive heart disease   . Ileus (HCC)    a. 02/2016 following CABG.  . Morbid obesity (HCC)   . PAD (peripheral artery disease) (HCC)    a. 12/2018 Periph Angio: RCIA 99p, LCIA 80p; b. 01/2019 ABI/Ultrasound: R 0.69, L 1.06. Abnl waveforms on R (known dzs)->Med mgmt in setting of absence of Ss.  Marland Kitchen Post-operative Atrial Fibrillation    a. 01/2016 following CABG.  . Renal artery stenosis (HCC)    a. 12/2018 Renal angio: RRA 50-60, LRA 60-->Med Rx.    Past Surgical History:  Procedure Laterality Date  . CARDIAC CATHETERIZATION N/A 02/12/2016   Procedure: Left Heart Cath and Coronary Angiography;  Surgeon: [name], MD;  Location: [facility] INVASIVE CV LAB;  Service: Cardiovascular;  Laterality: N/A;  . COLONOSCOPY WITH PROPOFOL N/A 12/27/2016   Procedure: COLONOSCOPY WITH PROPOFOL;  Surgeon: [name], MD;  Location: [facility] ENDOSCOPY;  Service: Endoscopy;  Laterality: N/A;  . CORONARY ARTERY BYPASS GRAFT N/A 02/13/2016   Procedure: CORONARY ARTERY BYPASS GRAFTING (CABG)x 3 with endoscopic harvesting of right  saphenous  vein LIMA to LAD SVG to Ramus SVG to PDA;  Surgeon: Ivin Poot, MD;  Location: [place];  Service: Open Heart Surgery;  Laterality: N/A;  . JOINT REPLACEMENT    . RENAL ANGIOGRAPHY N/A 12/23/2018   Procedure: RENAL ANGIOGRAPHY;  Surgeon: Wellington Hampshire, MD;  Location: [place] CV LAB;  Service: Cardiovascular;  Laterality: N/A;  . TEE WITHOUT CARDIOVERSION N/A 02/13/2016   Procedure:  TRANSESOPHAGEAL ECHOCARDIOGRAM (TEE);  Surgeon: Ivin Poot, MD;  Location: [place];  Service: Open Heart Surgery;  Laterality: N/A;     Current Outpatient Medications  Medication Sig Dispense Refill  . acetaminophen (TYLENOL) 500 MG tablet Take 500 mg by mouth every 8 (eight) hours as needed for mild pain or moderate pain.     Marland Kitchen allopurinol (ZYLOPRIM) 100 MG tablet Take 100 mg by mouth 2 (two) times daily.   12  . amLODipine (NORVASC) 10 MG tablet Take 1 tablet (10 mg total) by mouth daily. 90 tablet 3  . aspirin 81 MG EC tablet Take 1 tablet (81 mg total) by mouth daily.    Marland Kitchen atorvastatin (LIPITOR) 40 MG tablet Take 1 tablet (40 mg total) by mouth daily. 30 tablet 1  . carvedilol (COREG) 25 MG tablet Take 1 tablet (25 mg total) 2 (two) times daily with a meal by mouth. 180 tablet 1  . gabapentin (NEURONTIN) 300 MG capsule Take 300 mg by mouth 2 (two) times daily.    Marland Kitchen glipiZIDE (GLUCOTROL XL) 2.5 MG 24 hr tablet Take 2.5 mg by mouth daily.  11  . losartan (COZAAR) 50 MG tablet Take 100 mg by mouth daily.    Marland Kitchen omega-3 acid ethyl esters (LOVAZA) 1 g capsule Take 1 capsule (1 g total) by mouth 2 (two) times daily.    Marland Kitchen OZEMPIC, 0.25 OR 0.5 MG/DOSE, 2 MG/1.5ML SOPN Inject 0.5 mg into the skin once a week.     No current facility-administered medications for this visit.    Allergies:   Penicillin g    Social History:  The patient  reports that he has quit smoking. He has never used smokeless tobacco. He reports that he does not drink alcohol or use drugs.   Family History:  The patient's family history includes Cancer in his mother; Heart disease in his father.    ROS:  Please see the history of present illness.   Otherwise, review of systems are positive for none.   All other systems are reviewed and negative.    PHYSICAL EXAM: VS:  BP 136/68   Pulse 83   Ht 6\' 1"  (1.854 m)   Wt 244 lb 12.8 oz (111 kg)   SpO2 97%   BMI 32.30 kg/m  , BMI Body mass index is 32.3 kg/m. GEN: Well  nourished, well developed, in no acute distress  HEENT: normal  Neck: no JVD, carotid bruits, or masses Cardiac: RRR; no  rubs, or gallops. 2/ 6 systolic ejection murmur at the base. There is mild bilateral leg edema Respiratory:  clear to auscultation bilaterally, normal work of breathing GI: soft, nontender, nondistended, + BS MS: no deformity or atrophy  Skin: warm and dry, no rash Neuro:  Strength and sensation are intact Psych: euthymic mood, full affect There is a callus on the bottom of the right foot but no open ulceration.   EKG:  EKG is ordered today. The ekg ordered today demonstrates normal sinus rhythm with lateral T wave changes suggestive of ischemia.  Recent Labs: 12/23/2018: Hemoglobin 13.8; Platelets 190 04/06/2019: BUN 28; Creatinine, Ser 1.89; Potassium 4.4; Sodium 142    Lipid Panel    Component Value Date/Time   CHOL 105 02/13/2016 0508   TRIG 173 (H) 02/20/2016 0440   HDL 21 (L) 02/13/2016 0508   CHOLHDL 5.0 02/13/2016 0508   VLDL 47 (H) 02/13/2016 0508   LDLCALC 37 02/13/2016 0508      Wt Readings from Last 3 Encounters:  07/01/19 244 lb 12.8 oz (111 kg)  03/31/19 244 lb 8 oz (110.9 kg)  02/16/19 246 lb (111.6 kg)       PAD Screen 03/07/2016  Previous PAD dx? No  Previous surgical procedure? No  Pain with walking? Yes  Subsides with rest? Yes  Feet/toe relief with dangling? No  Painful, non-healing ulcers? No  Extremities discolored? No      ASSESSMENT AND PLAN:  1.  Moderate bilateral renal artery stenosis: This was not severe enough by angiography to require revascularization.  Continue medical therapy.    2. Coronary artery disease involving native coronary arteries without angina: Chronic atypical musculoskeletal chest pain.  Some chest tightness recently he thinks it is due to his allergies.  Symptoms are overall mild.  I asked him to monitor symptoms and let us know if there is worsening.  EKG today is unremarkable.    3.  Essential hypertension: Blood pressure is reasonably controlled on current medications.  4. Hyperlipidemia: Continue treatment with atorvastatin 40 mg daily.   5.  Peripheral arterial disease: The patient was found recently to have bilateral common iliac artery disease much worse on the right side than the left side.  ABI was normal on the left but 0.69 on the right.  He does have significant lifestyle limiting right leg claudication.  I again discussed with him the option of proceeding with angiography endovascular intervention.  I discussed the procedure and the patient prefers to wait for now.  I asked him to inform me if there is worsening.   Disposition:   FU with me in 6 months  Signed,  [name], MD  07/01/2019 8:28 AM    [facility]

## 2020-01-21 ENCOUNTER — Ambulatory Visit: Payer: Medicare Other | Admitting: Cardiovascular Disease

## 2020-01-21 ENCOUNTER — Encounter: Payer: Self-pay | Admitting: Cardiovascular Disease

## 2020-01-21 ENCOUNTER — Other Ambulatory Visit: Payer: Self-pay

## 2020-01-21 VITALS — BP 128/80 | HR 78 | Ht 73.0 in | Wt 236.4 lb

## 2020-01-21 DIAGNOSIS — E782 Mixed hyperlipidemia: Secondary | ICD-10-CM

## 2020-01-21 DIAGNOSIS — I739 Peripheral vascular disease, unspecified: Secondary | ICD-10-CM

## 2020-01-21 DIAGNOSIS — I5032 Chronic diastolic (congestive) heart failure: Secondary | ICD-10-CM

## 2020-01-21 DIAGNOSIS — I2510 Atherosclerotic heart disease of native coronary artery without angina pectoris: Secondary | ICD-10-CM | POA: Diagnosis not present

## 2020-01-21 DIAGNOSIS — I10 Essential (primary) hypertension: Secondary | ICD-10-CM

## 2020-01-21 DIAGNOSIS — N1832 Chronic kidney disease, stage 3b: Secondary | ICD-10-CM

## 2020-01-21 MED ORDER — SODIUM CHLORIDE 0.9% FLUSH: 3.0000 mL | Freq: Two times a day (BID) | INTRAVENOUS | Status: DC

## 2020-01-21 MED ORDER — CLOPIDOGREL BISULFATE 75 MG PO TABS: 75.0000 mg | ORAL_TABLET | Freq: Every day | ORAL | 11 refills | Status: DC

## 2020-01-21 NOTE — Progress Notes (Signed)
Cardiology Office Note   Date:  01/21/2020   ID:  James Clayton, DOB 1945-04-01, MRN [id number]  PCP:  [name], MD  Cardiologist:   [name], MD   Chief Complaint  Patient presents with  . other    6 month follow up. Meds reviewed by the pt. verbally. Pt. c/o when doing yard work, he will have some chest discomfort after bending over and standing back up.       History of Present Illness: James Clayton is a 74 y.o. male who presents for a follow-up visit regarding coronary artery disease and peripheral arterial disease. He has chronic medical conditions that include hypertension, hyperlipidemia, type 2 diabetes, stage III chronic kidney disease and obesity. 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 Cardiac catheterization showed severe three-vessel and left main disease. Echo showed normal LV systolic function.   He has known history of refractory hypertension.  Renal artery duplex was suggestive of possible bilateral renal artery stenosis.  Angiography in October 2020 showed moderate bilateral renal artery stenosis estimated to be 60% bilaterally.  During angiography, the patient was found to have bilateral common iliac artery stenosis much worse on the right side. Noninvasive vascular testing showed an ABI of 0.69 on the right and normal on the left.  There was no significant infrainguinal disease. He has chronic atypical chest pain that usually happens after bending over and standing back up.  In addition, he continues to have severe right hip and leg claudication. He continues to have a small ulceration on the bottom of the right foot which has improved overall.  The patient was told that he will require hammertoe surgery in the near future.     Past Medical History:  Diagnosis Date  . CAD (coronary artery disease)    a. LHC 02/12/16: ostLM-LM 90% severely calcified, ost-mLAD 90%, ostRamus-Ramus  80%, ostLCx-pLCx 90%, ostRCA-pRCA 100% severely calcified, LVEDP severely elevated; b. 01/2016 CABG x 3 (LIMA->LAD, VG->PDA, VG->RI).   . Chronic diastolic CHF (congestive heart failure) (HCC)    a. echo 02/10/16: EF 50-55%, mild inf HK, GR2DD, mild to mod MR, PASP 46 mmHg; b. 04/2018 Echo: EF >55%. Triv AI. Mild TR.  . CKD (chronic kidney disease), stage III (HCC)   . Diabetes mellitus with complication (HCC)   . HLD (hyperlipidemia)   . Hypertensive heart disease   . Ileus (HCC)    a. 02/2016 following CABG.  . Morbid obesity (HCC)   . PAD (peripheral artery disease) (HCC)    a. 12/2018 Periph Angio: RCIA 99p, LCIA 80p; b. 01/2019 ABI/Ultrasound: R 0.69, L 1.06. Abnl waveforms on R (known dzs)->Med mgmt in setting of absence of Ss.  Marland Kitchen Post-operative Atrial Fibrillation    a. 01/2016 following CABG.  . Renal artery stenosis (HCC)    a. 12/2018 Renal angio: RRA 50-60, LRA 60-->Med Rx.    Past Surgical History:  Procedure Laterality Date  . CARDIAC CATHETERIZATION N/A 02/12/2016   Procedure: Left Heart Cath and Coronary Angiography;  Surgeon: [name], MD;  Location: [facility] INVASIVE CV LAB;  Service: Cardiovascular;  Laterality: N/A;  . COLONOSCOPY WITH PROPOFOL N/A 12/27/2016   Procedure: COLONOSCOPY WITH PROPOFOL;  Surgeon: [name], MD;  Location: [facility] ENDOSCOPY;  Service: Endoscopy;  Laterality: N/A;  . CORONARY ARTERY BYPASS GRAFT N/A 02/13/2016   Procedure: CORONARY ARTERY BYPASS GRAFTING (CABG)x 3 with endoscopic harvesting of right saphenous  vein LIMA to  LAD SVG to Ramus SVG to PDA;  Surgeon: [name], MD;  Location: [facility] OR;  Service: Open Heart Surgery;  Laterality: N/A;  . JOINT REPLACEMENT    . RENAL ANGIOGRAPHY N/A 12/23/2018   Procedure: RENAL ANGIOGRAPHY;  Surgeon: [name], MD;  Location: [facility] INVASIVE CV LAB;  Service: Cardiovascular;  Laterality: N/A;  . TEE WITHOUT CARDIOVERSION N/A 02/13/2016   Procedure: TRANSESOPHAGEAL ECHOCARDIOGRAM (TEE);   Surgeon: [name], MD;  Location: [facility] OR;  Service: Open Heart Surgery;  Laterality: N/A;     Current Outpatient Medications  Medication Sig Dispense Refill  . furosemide (LASIX) 40 MG tablet TAKE ONE TABLET EVERY OTHER DAY AS NEEDED    . gemfibrozil (LOPID) 600 MG tablet Take by mouth.    . hydrALAZINE (APRESOLINE) 25 MG tablet Take 1 tablet by mouth 2 (two) times daily.    . hydrochlorothiazide (HYDRODIURIL) 25 MG tablet Take 1 tablet by mouth every morning.    Marland Kitchen HYDROcodone-acetaminophen (NORCO/VICODIN) 5-325 MG tablet Take by mouth.    Marland Kitchen acetaminophen (TYLENOL) 500 MG tablet Take 500 mg by mouth every 8 (eight) hours as needed for mild pain or moderate pain.     Marland Kitchen allopurinol (ZYLOPRIM) 100 MG tablet Take 100 mg by mouth 2 (two) times daily.   12  . amLODipine (NORVASC) 10 MG tablet Take 1 tablet (10 mg total) by mouth daily. 90 tablet 3  . aspirin 81 MG EC tablet Take 1 tablet (81 mg total) by mouth daily.    Marland Kitchen atorvastatin (LIPITOR) 40 MG tablet Take 1 tablet (40 mg total) by mouth daily. 30 tablet 1  . carvedilol (COREG) 25 MG tablet Take 1 tablet (25 mg total) 2 (two) times daily with a meal by mouth. 180 tablet 1  . gabapentin (NEURONTIN) 300 MG capsule Take 300 mg by mouth 2 (two) times daily.    Marland Kitchen glipiZIDE (GLUCOTROL XL) 2.5 MG 24 hr tablet Take 2.5 mg by mouth daily.  11  . losartan (COZAAR) 50 MG tablet Take 100 mg by mouth daily.    Marland Kitchen omega-3 acid ethyl esters (LOVAZA) 1 g capsule Take 1 capsule (1 g total) by mouth 2 (two) times daily.    Marland Kitchen OZEMPIC, 0.25 OR 0.5 MG/DOSE, 2 MG/1.5ML SOPN Inject 0.5 mg into the skin once a week.     No current facility-administered medications for this visit.    Allergies:   Penicillin g    Social History:  The patient  reports that he has quit smoking. He has never used smokeless tobacco. He reports that he does not drink alcohol and does not use drugs.   Family History:  The patient's family history includes Cancer in his mother;  Heart disease in his father.    ROS:  Please see the history of present illness.   Otherwise, review of systems are positive for none.   All other systems are reviewed and negative.    PHYSICAL EXAM: VS:  Ht 6\' 1"  (1.854 m)   Wt 236 lb 6 oz (107.2 kg)   BMI 31.19 kg/m  , BMI Body mass index is 31.19 kg/m. GEN: Well nourished, well developed, in no acute distress  HEENT: normal  Neck: no JVD, carotid bruits, or masses Cardiac: RRR; no  rubs, or gallops. 2/ 6 systolic ejection murmur at the base. There is mild bilateral leg edema Respiratory:  clear to auscultation bilaterally, normal work of breathing GI: soft, nontender, nondistended, + BS MS: no deformity or  atrophy  Skin: warm and dry, no rash Neuro:  Strength and sensation are intact Psych: euthymic mood, full affect There is a callus on the bottom of the right foot but no open ulceration.  Distal pulses are not palpable on the right.   EKG:  EKG is ordered today. The ekg ordered today demonstrates normal sinus rhythm with no significant ST or T wave changes.   Recent Labs: 04/06/2019: BUN 28; Creatinine, Ser 1.89; Potassium 4.4; Sodium 142    Lipid Panel    Component Value Date/Time   CHOL 105 02/13/2016 0508   TRIG 173 (H) 02/20/2016 0440   HDL 21 (L) 02/13/2016 0508   CHOLHDL 5.0 02/13/2016 0508   VLDL 47 (H) 02/13/2016 0508   LDLCALC 37 02/13/2016 0508      Wt Readings from Last 3 Encounters:  01/21/20 236 lb 6 oz (107.2 kg)  07/01/19 244 lb 12.8 oz (111 kg)  03/31/19 244 lb 8 oz (110.9 kg)       PAD Screen 03/07/2016  Previous PAD dx? No  Previous surgical procedure? No  Pain with walking? Yes  Subsides with rest? Yes  Feet/toe relief with dangling? No  Painful, non-healing ulcers? No  Extremities discolored? No      ASSESSMENT AND PLAN:  1. Peripheral arterial disease: The patient is known to have bilateral common iliac artery disease much worse on the right side than the left side.  ABI was  normal on the left but 0.69 on the right.  He does have significant lifestyle limiting right leg claudication.  In addition, he continues to have issues with recurrent ulceration on the bottom of the right foot related to callus and hammertoe deformity.  The patient was told that he will need surgery in the near future.  Due to both these reasons, I think we need to improve his blood flow and recommend proceeding with angiography and possible endovascular intervention.  He does have chronic kidney disease and thus we will bring him 4 hours earlier for hydration and plan on using CO2 and minimize contrast.  I discussed the risk of the procedures.  We will start the patient on Plavix 75 mg once daily.   2. Coronary artery disease involving native coronary arteries without angina: Chronic atypical musculoskeletal chest pain.  Some chest tightness recently he thinks it is due to his allergies.  Symptoms are overall mild.  I asked him to monitor symptoms and let us know if there is worsening.  EKG today is unremarkable.    3. Essential hypertension: Blood pressure is reasonably controlled on current medications.  4. Hyperlipidemia: Continue treatment with atorvastatin 40 mg daily.  He was recently started on gemfibrozil for mixed hyperlipidemia.  5.  Moderate bilateral renal artery stenosis: This was not severe enough by angiography to require revascularization.  Continue medical therapy.      Disposition:   FU with me in 1 months  Signed,  [name], MD  01/21/2020 7:59 AM     [facility]

## 2020-01-21 NOTE — H&P (View-Only) (Signed)
Cardiology Office Note   Date:  01/21/2020   ID:  XAYDEN Clayton, DOB 1945-04-01, MRN [id number]  PCP:  [name], MD  Cardiologist:   [name], MD   Chief Complaint  Patient presents with  . other    6 month follow up. Meds reviewed by the pt. verbally. Pt. c/o when doing yard work, he will have some chest discomfort after bending over and standing back up.       History of Present Illness: James Clayton is a 74 y.o. male who presents for a follow-up visit regarding coronary artery disease and peripheral arterial disease. He has chronic medical conditions that include hypertension, hyperlipidemia, type 2 diabetes, stage III chronic kidney disease and obesity. 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 Cardiac catheterization showed severe three-vessel and left main disease. Echo showed normal LV systolic function.   He has known history of refractory hypertension.  Renal artery duplex was suggestive of possible bilateral renal artery stenosis.  Angiography in October 2020 showed moderate bilateral renal artery stenosis estimated to be 60% bilaterally.  During angiography, the patient was found to have bilateral common iliac artery stenosis much worse on the right side. Noninvasive vascular testing showed an ABI of 0.69 on the right and normal on the left.  There was no significant infrainguinal disease. He has chronic atypical chest pain that usually happens after bending over and standing back up.  In addition, he continues to have severe right hip and leg claudication. He continues to have a small ulceration on the bottom of the right foot which has improved overall.  The patient was told that he will require hammertoe surgery in the near future.     Past Medical History:  Diagnosis Date  . CAD (coronary artery disease)    a. LHC 02/12/16: ostLM-LM 90% severely calcified, ost-mLAD 90%, ostRamus-Ramus  80%, ostLCx-pLCx 90%, ostRCA-pRCA 100% severely calcified, LVEDP severely elevated; b. 01/2016 CABG x 3 (LIMA->LAD, VG->PDA, VG->RI).   . Chronic diastolic CHF (congestive heart failure) (HCC)    a. echo 02/10/16: EF 50-55%, mild inf HK, GR2DD, mild to mod MR, PASP 46 mmHg; b. 04/2018 Echo: EF >55%. Triv AI. Mild TR.  . CKD (chronic kidney disease), stage III (HCC)   . Diabetes mellitus with complication (HCC)   . HLD (hyperlipidemia)   . Hypertensive heart disease   . Ileus (HCC)    a. 02/2016 following CABG.  . Morbid obesity (HCC)   . PAD (peripheral artery disease) (HCC)    a. 12/2018 Periph Angio: RCIA 99p, LCIA 80p; b. 01/2019 ABI/Ultrasound: R 0.69, L 1.06. Abnl waveforms on R (known dzs)->Med mgmt in setting of absence of Ss.  Marland Kitchen Post-operative Atrial Fibrillation    a. 01/2016 following CABG.  . Renal artery stenosis (HCC)    a. 12/2018 Renal angio: RRA 50-60, LRA 60-->Med Rx.    Past Surgical History:  Procedure Laterality Date  . CARDIAC CATHETERIZATION N/A 02/12/2016   Procedure: Left Heart Cath and Coronary Angiography;  Surgeon: [name], MD;  Location: [facility] INVASIVE CV LAB;  Service: Cardiovascular;  Laterality: N/A;  . COLONOSCOPY WITH PROPOFOL N/A 12/27/2016   Procedure: COLONOSCOPY WITH PROPOFOL;  Surgeon: [name], MD;  Location: [facility] ENDOSCOPY;  Service: Endoscopy;  Laterality: N/A;  . CORONARY ARTERY BYPASS GRAFT N/A 02/13/2016   Procedure: CORONARY ARTERY BYPASS GRAFTING (CABG)x 3 with endoscopic harvesting of right saphenous  vein LIMA to  LAD SVG to Ramus SVG to PDA;  Surgeon: [name], MD;  Location: [facility] OR;  Service: Open Heart Surgery;  Laterality: N/A;  . JOINT REPLACEMENT    . RENAL ANGIOGRAPHY N/A 12/23/2018   Procedure: RENAL ANGIOGRAPHY;  Surgeon: [name], MD;  Location: [facility] INVASIVE CV LAB;  Service: Cardiovascular;  Laterality: N/A;  . TEE WITHOUT CARDIOVERSION N/A 02/13/2016   Procedure: TRANSESOPHAGEAL ECHOCARDIOGRAM (TEE);   Surgeon: [name], MD;  Location: [facility] OR;  Service: Open Heart Surgery;  Laterality: N/A;     Current Outpatient Medications  Medication Sig Dispense Refill  . furosemide (LASIX) 40 MG tablet TAKE ONE TABLET EVERY OTHER DAY AS NEEDED    . gemfibrozil (LOPID) 600 MG tablet Take by mouth.    . hydrALAZINE (APRESOLINE) 25 MG tablet Take 1 tablet by mouth 2 (two) times daily.    . hydrochlorothiazide (HYDRODIURIL) 25 MG tablet Take 1 tablet by mouth every morning.    Marland Kitchen HYDROcodone-acetaminophen (NORCO/VICODIN) 5-325 MG tablet Take by mouth.    Marland Kitchen acetaminophen (TYLENOL) 500 MG tablet Take 500 mg by mouth every 8 (eight) hours as needed for mild pain or moderate pain.     Marland Kitchen allopurinol (ZYLOPRIM) 100 MG tablet Take 100 mg by mouth 2 (two) times daily.   12  . amLODipine (NORVASC) 10 MG tablet Take 1 tablet (10 mg total) by mouth daily. 90 tablet 3  . aspirin 81 MG EC tablet Take 1 tablet (81 mg total) by mouth daily.    Marland Kitchen atorvastatin (LIPITOR) 40 MG tablet Take 1 tablet (40 mg total) by mouth daily. 30 tablet 1  . carvedilol (COREG) 25 MG tablet Take 1 tablet (25 mg total) 2 (two) times daily with a meal by mouth. 180 tablet 1  . gabapentin (NEURONTIN) 300 MG capsule Take 300 mg by mouth 2 (two) times daily.    Marland Kitchen glipiZIDE (GLUCOTROL XL) 2.5 MG 24 hr tablet Take 2.5 mg by mouth daily.  11  . losartan (COZAAR) 50 MG tablet Take 100 mg by mouth daily.    Marland Kitchen omega-3 acid ethyl esters (LOVAZA) 1 g capsule Take 1 capsule (1 g total) by mouth 2 (two) times daily.    Marland Kitchen OZEMPIC, 0.25 OR 0.5 MG/DOSE, 2 MG/1.5ML SOPN Inject 0.5 mg into the skin once a week.     No current facility-administered medications for this visit.    Allergies:   Penicillin g    Social History:  The patient  reports that he has quit smoking. He has never used smokeless tobacco. He reports that he does not drink alcohol and does not use drugs.   Family History:  The patient's family history includes Cancer in his mother;  Heart disease in his father.    ROS:  Please see the history of present illness.   Otherwise, review of systems are positive for none.   All other systems are reviewed and negative.    PHYSICAL EXAM: VS:  Ht 6\' 1"  (1.854 m)   Wt 236 lb 6 oz (107.2 kg)   BMI 31.19 kg/m  , BMI Body mass index is 31.19 kg/m. GEN: Well nourished, well developed, in no acute distress  HEENT: normal  Neck: no JVD, carotid bruits, or masses Cardiac: RRR; no  rubs, or gallops. 2/ 6 systolic ejection murmur at the base. There is mild bilateral leg edema Respiratory:  clear to auscultation bilaterally, normal work of breathing GI: soft, nontender, nondistended, + BS MS: no deformity or  atrophy  Skin: warm and dry, no rash Neuro:  Strength and sensation are intact Psych: euthymic mood, full affect There is a callus on the bottom of the right foot but no open ulceration.  Distal pulses are not palpable on the right.   EKG:  EKG is ordered today. The ekg ordered today demonstrates normal sinus rhythm with no significant ST or T wave changes.   Recent Labs: 04/06/2019: BUN 28; Creatinine, Ser 1.89; Potassium 4.4; Sodium 142    Lipid Panel    Component Value Date/Time   CHOL 105 02/13/2016 0508   TRIG 173 (H) 02/20/2016 0440   HDL 21 (L) 02/13/2016 0508   CHOLHDL 5.0 02/13/2016 0508   VLDL 47 (H) 02/13/2016 0508   LDLCALC 37 02/13/2016 0508      Wt Readings from Last 3 Encounters:  01/21/20 236 lb 6 oz (107.2 kg)  07/01/19 244 lb 12.8 oz (111 kg)  03/31/19 244 lb 8 oz (110.9 kg)       PAD Screen 03/07/2016  Previous PAD dx? No  Previous surgical procedure? No  Pain with walking? Yes  Subsides with rest? Yes  Feet/toe relief with dangling? No  Painful, non-healing ulcers? No  Extremities discolored? No      ASSESSMENT AND PLAN:  1. Peripheral arterial disease: The patient is known to have bilateral common iliac artery disease much worse on the right side than the left side.  ABI was  normal on the left but 0.69 on the right.  He does have significant lifestyle limiting right leg claudication.  In addition, he continues to have issues with recurrent ulceration on the bottom of the right foot related to callus and hammertoe deformity.  The patient was told that he will need surgery in the near future.  Due to both these reasons, I think we need to improve his blood flow and recommend proceeding with angiography and possible endovascular intervention.  He does have chronic kidney disease and thus we will bring him 4 hours earlier for hydration and plan on using CO2 and minimize contrast.  I discussed the risk of the procedures.  We will start the patient on Plavix 75 mg once daily.   2. Coronary artery disease involving native coronary arteries without angina: Chronic atypical musculoskeletal chest pain.  Some chest tightness recently he thinks it is due to his allergies.  Symptoms are overall mild.  I asked him to monitor symptoms and let us know if there is worsening.  EKG today is unremarkable.    3. Essential hypertension: Blood pressure is reasonably controlled on current medications.  4. Hyperlipidemia: Continue treatment with atorvastatin 40 mg daily.  He was recently started on gemfibrozil for mixed hyperlipidemia.  5.  Moderate bilateral renal artery stenosis: This was not severe enough by angiography to require revascularization.  Continue medical therapy.      Disposition:   FU with me in 1 months  Signed,  [name], MD  01/21/2020 7:59 AM    [facility]

## 2020-01-21 NOTE — Patient Instructions (Signed)
Medication Instructions:  - Your physician has recommended you make the following change in your medication:   1) START plavix (clopidegrel) 75 mg- take 1 tablet by mouth once daily   *If you need a refill on your cardiac medications before your next appointment, please call your pharmacy*   Lab Work: 1) Pre procedure lab work: (BMP/ CBC)  - we will check with Dr. Judithann Sheen office to see if these can be done on 01/31/20 with his lab draw  2) Pre procedure COVID swab: Monday 01/31/20 (8:00 am- 1:00 pm) - come to the [facility] entrance at [facility] - this is a drive up test only, staff will come out to the car to swab you - after your swab is done, you will need to go home and   If you have labs (blood work) drawn today and your tests are completely normal, you will receive your results only by: Marland Kitchen MyChart Message (if you have MyChart) OR . A paper copy in the mail If you have any lab test that is abnormal or we need to change your treatment, we will call you to review the results.   Testing/Procedures: Your physician has requested that you have a peripheral vascular angiogram. This exam is performed at the hospital. During this exam IV contrast is used to look at arterial blood flow.      [facility] CARDIOVASCULAR DIVISION [facility] [address] [place] [place] [address] Dept: [phone] Loc: [phone]  James Clayton  01/21/2020  You are scheduled for a Peripheral Angiogram on Wednesday, November 17 with Dr. [name].  1. Please arrive at the [facility] (Main Entrance A) at [facility]: [address] [place], [place] [address] at 6:30 AM (This time is four hours before your procedure to ensure your preparation/ IV hydration). Free valet parking service is available.   Special note: Every effort is made to have your procedure done on time. Please understand that emergencies sometimes delay scheduled  procedures.  2. Diet: Do not eat solid foods after midnight.  You may have clear liquids until 5am upon the day of the procedure.  3. Labs: as above  4. Medication instructions in preparation for your procedure:   Contrast Allergy: No  - HOLD lasix (furosemide) the morning of your procedure - HOLD hydrochlorothiazide the morning of your procedure  On the morning of your procedure, take your Aspirin/ Plavix and any morning medicines NOT listed above.  You may use sips of water.  5. Plan for one night stay--bring personal belongings. 6. Bring a current list of your medications and current insurance cards. 7. You MUST have a responsible person to drive you home. 8. Someone MUST be with you the first 24 hours after you arrive home or your discharge will be delayed. 9. Please wear clothes that are easy to get on and off and wear slip-on shoes.  Thank you for allowing [place] to care for you!   -- [facility]   Follow-Up: At [facility], you and your health needs are our priority.  As part of our continuing mission to provide you with exceptional heart care, we have created designated Provider Care Teams.  These Care Teams include your primary Cardiologist (physician) and Advanced Practice Providers (APPs -  Physician Assistants and Nurse Practitioners) who all work together to provide you with the care you need, when you need it.  We recommend signing up for  the patient portal called "MyChart".  Sign up information is provided on this After Visit Summary.  MyChart is used to connect with patients for Virtual Visits (Telemedicine).  Patients are able to view lab/test results, encounter notes, upcoming appointments, etc.  Non-urgent messages can be sent to your provider as well.   To learn more about what you can do with MyChart, go to [email or web address].    Your next appointment:   1 month(s)  The format for your next appointment:   In  Person  Provider:   You may see [name], MD or one of the following Advanced Practice Providers on your designated Care Team:    Nicolasa Ducking, NP  Eula Listen, PA-C  Marisue Ivan, PA-C  Cadence Fransico Michael, [place]    Other Instructions N/A

## 2020-01-31 ENCOUNTER — Other Ambulatory Visit: Payer: Self-pay

## 2020-01-31 ENCOUNTER — Other Ambulatory Visit
Admission: RE | Admit: 2020-01-31 | Discharge: 2020-01-31 | Disposition: A | Discharge status: Home / Self Care | Payer: Medicare Other | Source: Ambulatory Visit | Attending: Cardiovascular Disease | Admitting: Cardiovascular Disease

## 2020-01-31 DIAGNOSIS — Z01812 Encounter for preprocedural laboratory examination: Secondary | ICD-10-CM | POA: Diagnosis present

## 2020-01-31 DIAGNOSIS — Z20822 Contact with and (suspected) exposure to covid-19: Secondary | ICD-10-CM | POA: Insufficient documentation

## 2020-02-01 ENCOUNTER — Telehealth: Payer: Self-pay | Admitting: *Deleted

## 2020-02-01 LAB — SARS CORONAVIRUS 2 (TAT 6-24 HRS): SARS Coronavirus 2: NEGATIVE

## 2020-02-01 NOTE — Telephone Encounter (Addendum)
Pt contacted pre-abdominal aortogram scheduled at [facility] for: Wednesday February 02, 2020 10:30 AM Verified arrival time and place: [facility] Main Entrance A [facility]) at: 6:30 AM-pre-procedure hydration   No solid food after midnight prior to cath, clear liquids until 5 AM day of procedure.  Hold: Lasix-day before and day of procedure-CKD Losartan-day before and day of procedure-CKD HCTZ-day before and day of procedure-already taken today--CKD  Except hold medications AM meds can be  taken pre-cath with sips of water including: ASA 81 mg Plavix 75 mg   Confirmed patient has responsible adult to drive home post procedure and be with patient first 24 hours after arriving home: yes  You are allowed ONE visitor in the waiting room during the time you are at the hospital for your procedure. Both you and your visitor must wear a mask once you enter the hospital.       COVID-19 Pre-Screening Questions:  . In the past 14 days have you had a new cough, new headache, new nasal congestion, fever (100.4 or greater) unexplained body aches, new sore throat, or sudden loss of taste or sense of smell? no . In the past 14 days have you been around anyone with known Covid 19? no    Reviewed procedure/mask/visitor instructions, COVID-19 questions with patient.               01/31/20 GFR 27/Cr 2.4-Dr Kirke Corin aware of results and okay to proceed with procedure.

## 2020-02-02 ENCOUNTER — Encounter (HOSPITAL_COMMUNITY)
Admission: RE | Disposition: A | Discharge status: Home / Self Care | Payer: Self-pay | Source: Home / Self Care | Attending: Cardiovascular Disease

## 2020-02-02 ENCOUNTER — Encounter (HOSPITAL_COMMUNITY): Payer: Self-pay | Admitting: Cardiovascular Disease

## 2020-02-02 ENCOUNTER — Other Ambulatory Visit: Payer: Self-pay

## 2020-02-02 ENCOUNTER — Observation Stay (HOSPITAL_COMMUNITY)
Admission: RE | Admit: 2020-02-02 | Discharge: 2020-02-03 | Disposition: A | Discharge status: Home / Self Care | Payer: Medicare Other | Attending: Cardiovascular Disease | Admitting: Cardiovascular Disease

## 2020-02-02 DIAGNOSIS — Z951 Presence of aortocoronary bypass graft: Secondary | ICD-10-CM | POA: Insufficient documentation

## 2020-02-02 DIAGNOSIS — E1122 Type 2 diabetes mellitus with diabetic chronic kidney disease: Secondary | ICD-10-CM | POA: Diagnosis not present

## 2020-02-02 DIAGNOSIS — I70212 Atherosclerosis of native arteries of extremities with intermittent claudication, left leg: Secondary | ICD-10-CM | POA: Diagnosis not present

## 2020-02-02 DIAGNOSIS — Z87891 Personal history of nicotine dependence: Secondary | ICD-10-CM | POA: Diagnosis not present

## 2020-02-02 DIAGNOSIS — I5032 Chronic diastolic (congestive) heart failure: Secondary | ICD-10-CM | POA: Diagnosis not present

## 2020-02-02 DIAGNOSIS — I701 Atherosclerosis of renal artery: Secondary | ICD-10-CM | POA: Insufficient documentation

## 2020-02-02 DIAGNOSIS — Z79899 Other long term (current) drug therapy: Secondary | ICD-10-CM | POA: Diagnosis not present

## 2020-02-02 DIAGNOSIS — N183 Chronic kidney disease, stage 3 unspecified: Secondary | ICD-10-CM | POA: Diagnosis not present

## 2020-02-02 DIAGNOSIS — I745 Embolism and thrombosis of iliac artery: Secondary | ICD-10-CM | POA: Insufficient documentation

## 2020-02-02 DIAGNOSIS — Z7984 Long term (current) use of oral hypoglycemic drugs: Secondary | ICD-10-CM | POA: Diagnosis not present

## 2020-02-02 DIAGNOSIS — E1151 Type 2 diabetes mellitus with diabetic peripheral angiopathy without gangrene: Secondary | ICD-10-CM | POA: Diagnosis not present

## 2020-02-02 DIAGNOSIS — I2510 Atherosclerotic heart disease of native coronary artery without angina pectoris: Secondary | ICD-10-CM | POA: Diagnosis not present

## 2020-02-02 DIAGNOSIS — E1159 Type 2 diabetes mellitus with other circulatory complications: Secondary | ICD-10-CM | POA: Insufficient documentation

## 2020-02-02 DIAGNOSIS — Z880 Allergy status to penicillin: Secondary | ICD-10-CM | POA: Diagnosis not present

## 2020-02-02 DIAGNOSIS — I130 Hypertensive heart and chronic kidney disease with heart failure and stage 1 through stage 4 chronic kidney disease, or unspecified chronic kidney disease: Secondary | ICD-10-CM | POA: Diagnosis not present

## 2020-02-02 DIAGNOSIS — E782 Mixed hyperlipidemia: Secondary | ICD-10-CM | POA: Diagnosis not present

## 2020-02-02 DIAGNOSIS — Z8249 Family history of ischemic heart disease and other diseases of the circulatory system: Secondary | ICD-10-CM | POA: Insufficient documentation

## 2020-02-02 DIAGNOSIS — I70213 Atherosclerosis of native arteries of extremities with intermittent claudication, bilateral legs: Secondary | ICD-10-CM | POA: Diagnosis not present

## 2020-02-02 DIAGNOSIS — L84 Corns and callosities: Secondary | ICD-10-CM | POA: Diagnosis not present

## 2020-02-02 DIAGNOSIS — Z7982 Long term (current) use of aspirin: Secondary | ICD-10-CM | POA: Insufficient documentation

## 2020-02-02 DIAGNOSIS — I129 Hypertensive chronic kidney disease with stage 1 through stage 4 chronic kidney disease, or unspecified chronic kidney disease: Secondary | ICD-10-CM | POA: Insufficient documentation

## 2020-02-02 DIAGNOSIS — I739 Peripheral vascular disease, unspecified: Secondary | ICD-10-CM | POA: Diagnosis present

## 2020-02-02 HISTORY — PX: ABDOMINAL AORTOGRAM W/LOWER EXTREMITY: CATH118223

## 2020-02-02 HISTORY — PX: PERIPHERAL VASCULAR INTERVENTION: CATH118257

## 2020-02-02 LAB — GLUCOSE, CAPILLARY
Glucose-Capillary: 193 mg/dL — ABNORMAL HIGH (ref 70–99)
Glucose-Capillary: 204 mg/dL — ABNORMAL HIGH (ref 70–99)
Glucose-Capillary: 336 mg/dL — ABNORMAL HIGH (ref 70–99)

## 2020-02-02 LAB — POCT ACTIVATED CLOTTING TIME
Activated Clotting Time: 241 seconds
Activated Clotting Time: 191 seconds
Activated Clotting Time: 175 seconds

## 2020-02-02 LAB — HEMOGLOBIN A1C
Hgb A1c MFr Bld: 8 % — ABNORMAL HIGH (ref 4.8–5.6)
Mean Plasma Glucose: 182.9 mg/dL

## 2020-02-02 SURGERY — ABDOMINAL AORTOGRAM W/LOWER EXTREMITY
Anesthesia: LOCAL

## 2020-02-02 MED ORDER — SODIUM CHLORIDE 0.9 % IV SOLN: 250.0000 mL | INTRAVENOUS | Status: DC | PRN

## 2020-02-02 MED ORDER — SODIUM CHLORIDE 0.9% FLUSH: 3.0000 mL | INTRAVENOUS | Status: DC | PRN

## 2020-02-02 MED ORDER — ACETAMINOPHEN 325 MG PO TABS
650.0000 mg | ORAL_TABLET | ORAL | Status: DC | PRN
  Administered 2020-02-02: 650 mg via ORAL
  Filled 2020-02-02: qty 2

## 2020-02-02 MED ORDER — ACETAMINOPHEN 500 MG PO TABS
500.0000 mg | ORAL_TABLET | Freq: Every day | ORAL | Status: DC
  Administered 2020-02-03: 500 mg via ORAL
  Filled 2020-02-02 (×2): qty 1

## 2020-02-02 MED ORDER — ASCORBIC ACID 500 MG PO TABS
500.0000 mg | ORAL_TABLET | Freq: Every day | ORAL | Status: DC
  Administered 2020-02-03: 500 mg via ORAL
  Filled 2020-02-02: qty 1

## 2020-02-02 MED ORDER — MIDAZOLAM HCL 2 MG/2ML IJ SOLN
INTRAMUSCULAR | Status: AC
  Filled 2020-02-02: qty 2

## 2020-02-02 MED ORDER — SODIUM CHLORIDE 0.9% FLUSH: 3.0000 mL | Freq: Two times a day (BID) | INTRAVENOUS | Status: DC

## 2020-02-02 MED ORDER — FENTANYL CITRATE (PF) 100 MCG/2ML IJ SOLN
INTRAMUSCULAR | Status: AC
  Filled 2020-02-02: qty 2

## 2020-02-02 MED ORDER — HEPARIN (PORCINE) IN NACL 1000-0.9 UT/500ML-% IV SOLN
INTRAVENOUS | Status: AC
  Filled 2020-02-02: qty 1000

## 2020-02-02 MED ORDER — HYDROCHLOROTHIAZIDE 25 MG PO TABS
25.0000 mg | ORAL_TABLET | Freq: Every day | ORAL | Status: DC
  Administered 2020-02-03: 25 mg via ORAL
  Filled 2020-02-02: qty 1

## 2020-02-02 MED ORDER — CLOPIDOGREL BISULFATE 75 MG PO TABS
75.0000 mg | ORAL_TABLET | Freq: Every day | ORAL | Status: DC
  Administered 2020-02-03: 75 mg via ORAL
  Filled 2020-02-02: qty 1

## 2020-02-02 MED ORDER — MORPHINE SULFATE (PF) 2 MG/ML IV SOLN
INTRAVENOUS | Status: AC
  Filled 2020-02-02: qty 1

## 2020-02-02 MED ORDER — LABETALOL HCL 5 MG/ML IV SOLN: 10.0000 mg | INTRAVENOUS | Status: DC | PRN

## 2020-02-02 MED ORDER — ADULT MULTIVITAMIN W/MINERALS CH
1.0000 | ORAL_TABLET | Freq: Every day | ORAL | Status: DC
  Administered 2020-02-03: 1 via ORAL
  Filled 2020-02-02: qty 1

## 2020-02-02 MED ORDER — SODIUM CHLORIDE 0.9 % WEIGHT BASED INFUSION
1.0000 mL/kg/h | INTRAVENOUS | Status: DC
  Administered 2020-02-02: 1 mL/kg/h via INTRAVENOUS

## 2020-02-02 MED ORDER — LIDOCAINE HCL (PF) 1 % IJ SOLN
INTRAMUSCULAR | Status: AC
  Filled 2020-02-02: qty 30

## 2020-02-02 MED ORDER — INSULIN ASPART 100 UNIT/ML ~~LOC~~ SOLN
0.0000 [IU] | Freq: Three times a day (TID) | SUBCUTANEOUS | Status: DC
  Administered 2020-02-03: 5 [IU] via SUBCUTANEOUS
  Administered 2020-02-03: 3 [IU] via SUBCUTANEOUS

## 2020-02-02 MED ORDER — LOSARTAN POTASSIUM 50 MG PO TABS: 100.0000 mg | ORAL_TABLET | Freq: Every day | ORAL | Status: DC

## 2020-02-02 MED ORDER — FENTANYL CITRATE (PF) 100 MCG/2ML IJ SOLN
INTRAMUSCULAR | Status: DC | PRN
  Administered 2020-02-02: 50 ug via INTRAVENOUS
  Administered 2020-02-02: 25 ug via INTRAVENOUS
  Administered 2020-02-02: 50 ug via INTRAVENOUS
  Administered 2020-02-02 (×3): 25 ug via INTRAVENOUS
  Administered 2020-02-02: 50 ug via INTRAVENOUS

## 2020-02-02 MED ORDER — HEPARIN (PORCINE) IN NACL 1000-0.9 UT/500ML-% IV SOLN
INTRAVENOUS | Status: DC | PRN
  Administered 2020-02-02 (×3): 500 mL

## 2020-02-02 MED ORDER — MIDAZOLAM HCL 2 MG/2ML IJ SOLN
INTRAMUSCULAR | Status: DC | PRN
  Administered 2020-02-02 (×4): 1 mg via INTRAVENOUS

## 2020-02-02 MED ORDER — GEMFIBROZIL 600 MG PO TABS
600.0000 mg | ORAL_TABLET | Freq: Two times a day (BID) | ORAL | Status: DC
  Administered 2020-02-02 – 2020-02-03 (×2): 600 mg via ORAL
  Filled 2020-02-02 (×3): qty 1

## 2020-02-02 MED ORDER — ASPIRIN EC 81 MG PO TBEC
81.0000 mg | DELAYED_RELEASE_TABLET | Freq: Every day | ORAL | Status: DC
  Administered 2020-02-03: 81 mg via ORAL
  Filled 2020-02-02: qty 1

## 2020-02-02 MED ORDER — ONDANSETRON HCL 4 MG/2ML IJ SOLN
4.0000 mg | Freq: Four times a day (QID) | INTRAMUSCULAR | Status: DC | PRN
  Administered 2020-02-02: 4 mg via INTRAVENOUS
  Filled 2020-02-02: qty 2

## 2020-02-02 MED ORDER — SEMAGLUTIDE (1 MG/DOSE) 4 MG/3ML ~~LOC~~ SOPN: 1.0000 mg | PEN_INJECTOR | SUBCUTANEOUS | Status: DC

## 2020-02-02 MED ORDER — ATORVASTATIN CALCIUM 40 MG PO TABS
40.0000 mg | ORAL_TABLET | Freq: Every day | ORAL | Status: DC
  Administered 2020-02-03: 40 mg via ORAL
  Filled 2020-02-02: qty 1

## 2020-02-02 MED ORDER — VITAMIN B-12 1000 MCG PO TABS
1000.0000 ug | ORAL_TABLET | Freq: Every day | ORAL | Status: DC
  Administered 2020-02-03: 1000 ug via ORAL
  Filled 2020-02-02: qty 1

## 2020-02-02 MED ORDER — HEPARIN SODIUM (PORCINE) 1000 UNIT/ML IJ SOLN
INTRAMUSCULAR | Status: AC
  Filled 2020-02-02: qty 1

## 2020-02-02 MED ORDER — LIDOCAINE HCL (PF) 1 % IJ SOLN
INTRAMUSCULAR | Status: DC | PRN
  Administered 2020-02-02 (×2): 20 mL

## 2020-02-02 MED ORDER — MORPHINE SULFATE (PF) 2 MG/ML IV SOLN
2.0000 mg | INTRAVENOUS | Status: DC | PRN
  Administered 2020-02-02: 2 mg via INTRAVENOUS

## 2020-02-02 MED ORDER — HEPARIN SODIUM (PORCINE) 1000 UNIT/ML IJ SOLN
INTRAMUSCULAR | Status: DC | PRN
  Administered 2020-02-02: 4000 [IU] via INTRAVENOUS
  Administered 2020-02-02: 2000 [IU] via INTRAVENOUS
  Administered 2020-02-02: 3000 [IU] via INTRAVENOUS
  Administered 2020-02-02 (×2): 4000 [IU] via INTRAVENOUS

## 2020-02-02 MED ORDER — CARVEDILOL 25 MG PO TABS
25.0000 mg | ORAL_TABLET | Freq: Two times a day (BID) | ORAL | Status: DC
  Administered 2020-02-02 – 2020-02-03 (×2): 25 mg via ORAL
  Filled 2020-02-02 (×2): qty 1

## 2020-02-02 MED ORDER — GABAPENTIN 300 MG PO CAPS
300.0000 mg | ORAL_CAPSULE | Freq: Two times a day (BID) | ORAL | Status: DC
  Administered 2020-02-02 – 2020-02-03 (×2): 300 mg via ORAL
  Filled 2020-02-02 (×2): qty 1

## 2020-02-02 MED ORDER — AMLODIPINE BESYLATE 10 MG PO TABS
10.0000 mg | ORAL_TABLET | Freq: Every day | ORAL | Status: DC
  Administered 2020-02-03: 10 mg via ORAL
  Filled 2020-02-02: qty 1

## 2020-02-02 MED ORDER — INSULIN ASPART 100 UNIT/ML ~~LOC~~ SOLN
0.0000 [IU] | Freq: Every day | SUBCUTANEOUS | Status: DC
  Administered 2020-02-02: 4 [IU] via SUBCUTANEOUS

## 2020-02-02 MED ORDER — SODIUM CHLORIDE 0.9 % IV SOLN: INTRAVENOUS | Status: AC

## 2020-02-02 MED ORDER — SODIUM CHLORIDE 0.9 % WEIGHT BASED INFUSION
3.0000 mL/kg/h | INTRAVENOUS | Status: DC
  Administered 2020-02-02: 3 mL/kg/h via INTRAVENOUS

## 2020-02-02 MED ORDER — ASPIRIN 81 MG PO CHEW: 81.0000 mg | CHEWABLE_TABLET | ORAL | Status: DC

## 2020-02-02 MED ORDER — IODIXANOL 320 MG/ML IV SOLN
INTRAVENOUS | Status: DC | PRN
  Administered 2020-02-02: 70 mL via INTRA_ARTERIAL

## 2020-02-02 MED ORDER — HYDRALAZINE HCL 25 MG PO TABS
25.0000 mg | ORAL_TABLET | Freq: Two times a day (BID) | ORAL | Status: DC
  Administered 2020-02-02 – 2020-02-03 (×2): 25 mg via ORAL
  Filled 2020-02-02 (×2): qty 1

## 2020-02-02 MED ORDER — ALLOPURINOL 100 MG PO TABS
100.0000 mg | ORAL_TABLET | Freq: Two times a day (BID) | ORAL | Status: DC
  Administered 2020-02-02 – 2020-02-03 (×2): 100 mg via ORAL
  Filled 2020-02-02 (×2): qty 1

## 2020-02-02 SURGICAL SUPPLY — 42 items
BALLN MUSTANG 5.0X40 75 (BALLOONS) ×6
BALLN MUSTANG 8.0X40 75 (BALLOONS) ×3
BALLN MUSTANG 9X40X75 (BALLOONS) ×3
BALLOON MUSTANG 5.0X40 75 (BALLOONS) ×4 IMPLANT
BALLOON MUSTANG 8.0X40 75 (BALLOONS) ×2 IMPLANT
BALLOON MUSTANG 9X40X75 (BALLOONS) ×2 IMPLANT
CATH ANGIO 5F BER2 65CM (CATHETERS) ×3 IMPLANT
CATH ANGIO 5F PIGTAIL 65CM (CATHETERS) ×3 IMPLANT
CATH CROSS OVER TEMPO 5F (CATHETERS) ×3 IMPLANT
CATH NAVICROSS ANG 65CM (CATHETERS) ×2 IMPLANT
CATH STRAIGHT 5FR 65CM (CATHETERS) ×3 IMPLANT
CATHETER NAVICROSS ANG 65CM (CATHETERS) ×3
DEVICE CLOSURE MYNXGRIP 6/7F (Vascular Products) ×6 IMPLANT
DEVICE TORQUE .014-.018 (MISCELLANEOUS) ×2 IMPLANT
DEVICE TORQUE .025-.038 (MISCELLANEOUS) ×3 IMPLANT
FILTER CO2 0.2 MICRON (VASCULAR PRODUCTS) ×3 IMPLANT
GUIDEWIRE ANGLED .035X150CM (WIRE) ×6 IMPLANT
GUIDEWIRE ZILIENT 30G 018 (WIRE) ×3 IMPLANT
KIT ENCORE 26 ADVANTAGE (KITS) ×6 IMPLANT
KIT MICROPUNCTURE NIT STIFF (SHEATH) ×3 IMPLANT
KIT PV (KITS) ×3 IMPLANT
RESERVOIR CO2 (VASCULAR PRODUCTS) ×3 IMPLANT
SET FLUSH CO2 (MISCELLANEOUS) ×3 IMPLANT
SHEATH BRITE TIP 7FR 35CM (SHEATH) ×9 IMPLANT
SHEATH PINNACLE 5F 10CM (SHEATH) ×6 IMPLANT
SHEATH PINNACLE 7F 10CM (SHEATH) ×6 IMPLANT
SHEATH PROBE COVER 6X72 (BAG) ×3 IMPLANT
STENT EPIC VASCULAR 9X40X75 (Permanent Stent) ×3 IMPLANT
STENT EXPRESS LD 8X37X135 (Permanent Stent) ×3 IMPLANT
STENT EXPRESS LD 8X37X75 (Permanent Stent) ×3 IMPLANT
STENT VIABAHN 8X29X80 VBX (Permanent Stent) ×6 IMPLANT
STENT VIABAHN 8X39X80 VBX (Permanent Stent) ×3 IMPLANT
STENT VIABAHNBX 8X59X80 (Permanent Stent) ×3 IMPLANT
STOPCOCK MORSE 400PSI 3WAY (MISCELLANEOUS) ×3 IMPLANT
SYR MEDRAD MARK 7 150ML (SYRINGE) ×3 IMPLANT
TORQUE DEVICE .014-.018 (MISCELLANEOUS) ×3
TRANSDUCER W/STOPCOCK (MISCELLANEOUS) ×3 IMPLANT
TRAY PV CATH (CUSTOM PROCEDURE TRAY) ×3 IMPLANT
TUBING CIL FLEX 10 FLL-RA (TUBING) ×3 IMPLANT
WIRE BENTSON .035X145CM (WIRE) ×3 IMPLANT
WIRE HI TORQ VERSACORE J 260CM (WIRE) ×6 IMPLANT
WIRE ROSEN-J .035X180CM (WIRE) ×3 IMPLANT

## 2020-02-02 NOTE — Plan of Care (Signed)

## 2020-02-02 NOTE — Interval H&P Note (Signed)
History and Physical Interval Note:  02/02/2020 11:08 AM  James Clayton  has presented today for surgery, with the diagnosis of peripheral vascular disease.  The various methods of treatment have been discussed with the patient and family. After consideration of risks, benefits and other options for treatment, the patient has consented to  Procedure(s): ABDOMINAL AORTOGRAM W/LOWER EXTREMITY (N/A) as a surgical intervention.  The patient's history has been reviewed, patient examined, no change in status, stable for surgery.  I have reviewed the patient's chart and labs.  Questions were answered to the patient's satisfaction.     [name]

## 2020-02-02 NOTE — Progress Notes (Signed)
Pt arrived from cath lab at 1517. Rt femoral was a level 0 left femoral was a level 2 held pressure for 20 minutes with assistance from cath lab. Distal pulses palpable. Left groin site now soft level 1 due to being slightly bigger than right groin. Will continue to monitor. Pt denied any pain or discomfort at site.

## 2020-02-03 ENCOUNTER — Other Ambulatory Visit: Payer: Self-pay | Admitting: Cardiology

## 2020-02-03 ENCOUNTER — Encounter (HOSPITAL_COMMUNITY): Payer: Self-pay | Admitting: Cardiovascular Disease

## 2020-02-03 DIAGNOSIS — Z79899 Other long term (current) drug therapy: Secondary | ICD-10-CM

## 2020-02-03 DIAGNOSIS — N1831 Chronic kidney disease, stage 3a: Secondary | ICD-10-CM

## 2020-02-03 DIAGNOSIS — I739 Peripheral vascular disease, unspecified: Secondary | ICD-10-CM

## 2020-02-03 DIAGNOSIS — E1151 Type 2 diabetes mellitus with diabetic peripheral angiopathy without gangrene: Secondary | ICD-10-CM | POA: Diagnosis not present

## 2020-02-03 LAB — CBC WITH DIFFERENTIAL/PLATELET
Abs Immature Granulocytes: 0.08 10*3/uL — ABNORMAL HIGH (ref 0.00–0.07)
Basophils Absolute: 0 10*3/uL (ref 0.0–0.1)
Basophils Relative: 0 %
Eosinophils Absolute: 0.2 10*3/uL (ref 0.0–0.5)
Eosinophils Relative: 1 %
HCT: 36.2 % — ABNORMAL LOW (ref 39.0–52.0)
Hemoglobin: 12.1 g/dL — ABNORMAL LOW (ref 13.0–17.0)
Immature Granulocytes: 1 %
Lymphocytes Relative: 11 %
Lymphs Abs: 1.4 10*3/uL (ref 0.7–4.0)
MCH: 30.4 pg (ref 26.0–34.0)
MCHC: 33.4 g/dL (ref 30.0–36.0)
MCV: 91 fL (ref 80.0–100.0)
Monocytes Absolute: 0.9 10*3/uL (ref 0.1–1.0)
Monocytes Relative: 7 %
Neutro Abs: 10.2 10*3/uL — ABNORMAL HIGH (ref 1.7–7.7)
Neutrophils Relative %: 80 %
Platelets: 201 10*3/uL (ref 150–400)
RBC: 3.98 MIL/uL — ABNORMAL LOW (ref 4.22–5.81)
RDW: 13.5 % (ref 11.5–15.5)
WBC: 12.8 10*3/uL — ABNORMAL HIGH (ref 4.0–10.5)
nRBC: 0 % (ref 0.0–0.2)

## 2020-02-03 LAB — BASIC METABOLIC PANEL
Anion gap: 10 (ref 5–15)
BUN: 41 mg/dL — ABNORMAL HIGH (ref 8–23)
CO2: 22 mmol/L (ref 22–32)
Calcium: 8.7 mg/dL — ABNORMAL LOW (ref 8.9–10.3)
Chloride: 107 mmol/L (ref 98–111)
Creatinine, Ser: 2.2 mg/dL — ABNORMAL HIGH (ref 0.61–1.24)
GFR, Estimated: 31 mL/min — ABNORMAL LOW (ref >60)
Glucose, Bld: 208 mg/dL — ABNORMAL HIGH (ref 70–99)
Potassium: 3.8 mmol/L (ref 3.5–5.1)
Sodium: 139 mmol/L (ref 135–145)

## 2020-02-03 LAB — GLUCOSE, CAPILLARY
Glucose-Capillary: 167 mg/dL — ABNORMAL HIGH (ref 70–99)
Glucose-Capillary: 203 mg/dL — ABNORMAL HIGH (ref 70–99)

## 2020-02-03 NOTE — Discharge Summary (Addendum)
The patient has been seen in conjunction with Laverda PageLindsay Roberts, NP. All aspects of care have been considered and discussed. The patient has been personally interviewed, examined, and all clinical data has been reviewed.  Please see the previously dictated note from this AM.    Discharge Summary    Patient ID: James PaceJames R Hodgman MRN: [id number]; DOB: Jun 27, 1945  Admit date: 02/02/2020 Discharge date: 02/03/2020  Primary Care Provider: Marguarite ArbourSparks, Jeffrey D, MD  Primary Cardiologist: Lorine BearsMuhammad Arida, MD  Primary Electrophysiologist:  None   Discharge Diagnoses    Active Problems:   PAD (peripheral artery disease) [facility](HCC)    Diagnostic Studies/Procedures    PV angiogram: 02/02/20   Successful complex covered stent placement to bilateral common iliac arteries which required additional stents into the distal aorta given that the entry from the right side was high into the distal aorta.   This was a very long procedure and exceeded fluoroscopy time given complexity.  The right common iliac artery was occluded and heavily calcified. 70 mL of contrast was used for the procedure.   Recommendations: Dual antiplatelet therapy indefinitely given multiple stents. Gentle hydration overnight and recheck renal function in the morning. 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   _____________   History of Present Illness     James Clayton is a 74 y.o. male with chronic medical conditions that include hypertension, hyperlipidemia, type 2 diabetes, stage III chronic kidney disease and obesity. 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 Cardiac catheterization showed severe three-vessel and left main disease. Echo showed normal LV systolic function.   He has known history of refractory hypertension.  Renal artery duplex was suggestive of possible bilateral renal artery  stenosis.  Angiography in October 2020 showed moderate bilateral renal artery stenosis estimated to be 60% bilaterally.  During angiography, the patient was found to have bilateral common iliac artery stenosis much worse on the right side. Noninvasive vascular testing showed an ABI of 0.69 on the right and normal on the left.  There was no significant infrainguinal disease. He has chronic atypical chest pain that usually happens after bending over and standing back up.  In addition, he continues to have severe right hip and leg claudication. He continues to have a small ulceration on the bottom of the right foot which has improved overall.  The patient was told that he will require hammertoe surgery in the near future. He is known to have bilateral common iliac artery disease much worse on the right side than the left side.  ABI was normal on the left but 0.69 on the right.  He does have significant lifestyle limiting right leg claudication.  In addition, he continued to have issues with recurrent ulceration on the bottom of the right foot related to callus and hammertoe deformity. Given this he was set up for outpatient PV angiogram.     Hospital Course     1. PAD: underwent long, complex but successful stenting to bilateral common iliac arteries with additional stenting into the distal aorta. Plan for DAPT with ASA/plavix. Morning labs were stable. Significant improvement. He was able to ambulate without difficulty.     2. HTN: stable with current therapy. Holding ARB this morning with elevated Cr.    3. HLD: on atorva 40mg  daily   4. CKD 3: Cr 2.2 this morning. Holding home ARB today, resume tomorrow. Baseline appears  around 1.5-2.0 -- repeat BMET on 11/22   5. DM: Hgb A1c 8.0, on Ozempic prior to admission. Plan to resume at discharge. Further management per PCP  Did the patient have an acute coronary syndrome (MI, NSTEMI, STEMI, etc) this admission?:  No                               Did the  patient have a percutaneous coronary intervention (stent / angioplasty)?:  No.       _____________  Discharge Vitals Blood pressure 133/68, pulse 76, temperature 98.2 F (36.8 C), temperature source Oral, resp. rate 19, height 6\' 1"  (1.854 m), weight 110 kg, SpO2 98 %.  Filed Weights   02/02/20 0625 02/03/20 0402  Weight: 103.8 kg 110 kg    Labs & Radiologic Studies    CBC Recent Labs    02/03/20 0208  WBC 12.8*  NEUTROABS 10.2*  HGB 12.1*  HCT 36.2*  MCV 91.0  PLT 201   Basic Metabolic Panel Recent Labs    02/05/20 0208  NA 139  K 3.8  CL 107  CO2 22  GLUCOSE 208*  BUN 41*  CREATININE 2.20*  CALCIUM 8.7*   Liver Function Tests No results for input(s): AST, ALT, ALKPHOS, BILITOT, PROT, ALBUMIN in the last 72 hours. No results for input(s): LIPASE, AMYLASE in the last 72 hours. High Sensitivity Troponin:   No results for input(s): TROPONINIHS in the last 720 hours.  BNP Invalid input(s): POCBNP D-Dimer No results for input(s): DDIMER in the last 72 hours. Hemoglobin A1C Recent Labs    02/02/20 2252  HGBA1C 8.0*   Fasting Lipid Panel No results for input(s): CHOL, HDL, LDLCALC, TRIG, CHOLHDL, LDLDIRECT in the last 72 hours. Thyroid Function Tests No results for input(s): TSH, T4TOTAL, T3FREE, THYROIDAB in the last 72 hours.  Invalid input(s): FREET3 _____________  PERIPHERAL VASCULAR CATHETERIZATION  Result Date: 02/02/2020 Successful complex covered stent placement to bilateral common iliac arteries which required additional stents into the distal aorta given that the entry from the right side was high into the distal aorta. This was a very long procedure and exceeded fluoroscopy time given complexity.  The right common iliac artery was occluded and heavily calcified. 70 mL of contrast was used for the procedure. Recommendations: Dual antiplatelet therapy indefinitely given multiple stents. Gentle hydration overnight and recheck renal function in the  morning. 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  Disposition   Pt is being discharged home today in good condition.  Follow-up Plans & Appointments     Follow-up Information     02/04/2020, NP Follow up on 03/02/2020.   Specialties: Nurse Practitioner, Cardiology, Radiology Why: at 9:15am for your follow up appt.  Contact information: [address] [place] 03/04/2020 [place] [phone]                Discharge Instructions     Diet - low sodium heart healthy   Complete by: As directed    Discharge instructions   Complete by: As directed    Groin Site Care Refer to this sheet in the next few weeks. These instructions provide you with information on caring for yourself after your procedure. Your caregiver may also give you more specific instructions. Your treatment has been planned according to current medical practices, but problems sometimes occur. Call your caregiver if  you have any problems or questions after your procedure. HOME CARE INSTRUCTIONS You may shower 24 hours after the procedure. Remove the bandage (dressing) and gently wash the site with plain soap and water. Gently pat the site dry.  Do not apply powder or lotion to the site.  Do not sit in a bathtub, swimming pool, or whirlpool for 5 to 7 days.  No bending, squatting, or lifting anything over 10 pounds (4.5 kg) as directed by your caregiver.  Inspect the site at least twice daily.  Do not drive home if you are discharged the same day of the procedure. Have someone else drive you.  You may drive 24 hours after the procedure unless otherwise instructed by your caregiver.  What to expect: Any bruising will usually fade within 1 to 2 weeks.  Blood that collects in the tissue (hematoma) may be painful to the touch. It should usually decrease in size and tenderness within 1 to 2 weeks.  SEEK IMMEDIATE MEDICAL CARE  IF: You have unusual pain at the groin site or down the affected leg.  You have redness, warmth, swelling, or pain at the groin site.  You have drainage (other than a small amount of blood on the dressing).  You have chills.  You have a fever or persistent symptoms for more than 72 hours.  You have a fever and your symptoms suddenly get worse.  Your leg becomes pale, cool, tingly, or numb.  You have heavy bleeding from the site. Hold pressure on the site. Marland Kitchen  PLEASE DO NOT MISS ANY DOSES OF YOUR PLAVIX!!!!! Also keep a log of you blood pressures and bring back to your follow up appt. Please call the office with any questions.   Patients taking blood thinners should generally stay away from medicines like ibuprofen, Advil, Motrin, naproxen, and Aleve due to risk of stomach bleeding. You may take Tylenol as directed or talk to your primary doctor about alternatives.   PLEASE ENSURE THAT YOU DO NOT RUN OUT OF YOUR BRILINTA/PLAVIX. This medication is very important to remain on for at least one year. IF you have issues obtaining this medication due to cost please CALL the office 3-5 business days prior to running out in order to prevent missing doses of this medication.   Increase activity slowly   Complete by: As directed    No wound care   Complete by: As directed        Discharge Medications   Allergies as of 02/03/2020       Reactions   Penicillin G    Other reaction(s): Other (See Comments) Passed out at age of 56        Medication List     TAKE these medications    acetaminophen 500 MG tablet Commonly known as: TYLENOL Take 500 mg by mouth daily.   allopurinol 100 MG tablet Commonly known as: ZYLOPRIM Take 100 mg by mouth 2 (two) times daily.   amLODipine 10 MG tablet Commonly known as: NORVASC Take 1 tablet (10 mg total) by mouth daily.   ascorbic acid 500 MG tablet Commonly known as: VITAMIN C Take 500 mg by mouth daily.   aspirin 81 MG EC tablet Take 1  tablet (81 mg total) by mouth daily.   atorvastatin 40 MG tablet Commonly known as: LIPITOR Take 1 tablet (40 mg total) by mouth daily.   carvedilol 25 MG tablet Commonly known as: COREG Take 1 tablet (25 mg total) 2 (two) times daily with  a meal by mouth.   clopidogrel 75 MG tablet Commonly known as: Plavix Take 1 tablet (75 mg total) by mouth daily.   furosemide 40 MG tablet Commonly known as: LASIX Take 40 mg by mouth every other day.   gabapentin 300 MG capsule Commonly known as: NEURONTIN Take 300 mg by mouth 2 (two) times daily.   gemfibrozil 600 MG tablet Commonly known as: LOPID Take 600 mg by mouth 2 (two) times daily before a meal.   hydrALAZINE 25 MG tablet Commonly known as: APRESOLINE Take 25 mg by mouth 2 (two) times daily.   hydrochlorothiazide 25 MG tablet Commonly known as: HYDRODIURIL Take 25 mg by mouth daily.   losartan 100 MG tablet Commonly known as: COZAAR Take 100 mg by mouth daily.   multivitamin with minerals Tabs tablet Take 1 tablet by mouth daily.   omega-3 acid ethyl esters 1 g capsule Commonly known as: LOVAZA Take 1 capsule (1 g total) by mouth 2 (two) times daily. What changed: when to take this   Ozempic (1 MG/DOSE) 4 MG/3ML Sopn Generic drug: Semaglutide (1 MG/DOSE) Inject 1 mg into the skin every Sunday.   vitamin B-12 1000 MCG tablet Commonly known as: CYANOCOBALAMIN Take 1,000 mcg by mouth daily.        Outstanding Labs/Studies   N/a   Duration of Discharge Encounter   Greater than 30 minutes including physician time.  Signed, Laverda Page, NP 02/03/2020, 11:40 AM

## 2020-02-03 NOTE — Progress Notes (Addendum)
The patient has been seen in conjunction with Geoffry Paradise, NP. All aspects of care have been considered and discussed. The patient has been personally interviewed, examined, and all clinical data has been reviewed.   Continue with recovery post complex lower extremity revascularization by Dr. Kirke Corin.  Only 70 cc of contrast used during the procedure.  Creatinine is 2.20 which is his typical baseline.  Back discomfort has resolved.  He is having bilateral anterior thigh discomfort.  Bilateral access sites do not reveal hematoma.  There is significant ecchymosis in the left iliac and anterior thigh region.  Plan ambulate.  Patient wants to go home.  If ambulation is without difficulty and groin sites are stable, will likely discharge today with early follow-up basic metabolic panel on November 19 or 22 at [facility] office to the attention of Dr. Kirke Corin.   Progress Note  Patient Name: James Clayton Date of Encounter: 02/03/2020  [facility] Cardiologist: [name], MD   Subjective   Feeling much better this morning. Lower back and buttocks pain has significantly improved.   Inpatient Medications    Scheduled Meds: . acetaminophen  500 mg Oral Daily  . allopurinol  100 mg Oral BID  . amLODipine  10 mg Oral Daily  . ascorbic acid  500 mg Oral Daily  . aspirin EC  81 mg Oral Daily  . atorvastatin  40 mg Oral Daily  . carvedilol  25 mg Oral BID WC  . clopidogrel  75 mg Oral Daily  . gabapentin  300 mg Oral BID  . gemfibrozil  600 mg Oral BID AC  . hydrALAZINE  25 mg Oral BID  . hydrochlorothiazide  25 mg Oral Daily  . insulin aspart  0-15 Units Subcutaneous TID WC  . insulin aspart  0-5 Units Subcutaneous QHS  . multivitamin with minerals  1 tablet Oral Daily  . sodium chloride flush  3 mL Intravenous Q12H  . sodium chloride flush  3 mL Intravenous Q12H  . vitamin B-12  1,000 mcg Oral Daily   Continuous Infusions: . sodium chloride     PRN Meds: sodium  chloride, acetaminophen, labetalol, morphine injection, ondansetron (ZOFRAN) IV, sodium chloride flush   Vital Signs    Vitals:   02/02/20 1517 02/02/20 2005 02/03/20 0002 02/03/20 0402  BP: 120/73 139/68 (!) 109/59 133/68  Pulse: 81 86 86 76  Resp: 16 19 16 19   Temp: 97.8 F (36.6 C) 98.6 F (37 C) 98.6 F (37 C) 98.2 F (36.8 C)  TempSrc: Oral Oral Oral Oral  SpO2: 99% 98% 96% 98%  Weight:    110 kg  Height:        Intake/Output Summary (Last 24 hours) at 02/03/2020 0823 Last data filed at 02/03/2020 0457 Gross per 24 hour  Intake 1513.99 ml  Output 700 ml  Net 813.99 ml   Last 3 Weights 02/03/2020 02/02/2020 01/21/2020  Weight (lbs) 242 lb 6.4 oz 228 lb 12.8 oz 236 lb 6 oz  Weight (kg) 109.952 kg 103.783 kg 107.219 kg      Telemetry    SR - Personally Reviewed  Physical Exam  Pleasant older male, sitting up in bed. GEN: No acute distress.   Neck: No JVD Cardiac: RRR, no murmurs, rubs, or gallops.  Respiratory: Clear to auscultation bilaterally. GI: Soft, nontender, non-distended  MS: No edema; No deformity. Left femoral site with ecchymosis but no hematoma Neuro:  Nonfocal  Psych: Normal affect   Labs    High Sensitivity  Troponin:  No results for input(s): TROPONINIHS in the last 720 hours.    Chemistry Recent Labs  Lab 02/03/20 0208  NA 139  K 3.8  CL 107  CO2 22  GLUCOSE 208*  BUN 41*  CREATININE 2.20*  CALCIUM 8.7*  GFRNONAA 31*  ANIONGAP 10     Hematology Recent Labs  Lab 02/03/20 0208  WBC 12.8*  RBC 3.98*  HGB 12.1*  HCT 36.2*  MCV 91.0  MCH 30.4  MCHC 33.4  RDW 13.5  PLT 201    BNPNo results for input(s): BNP, PROBNP in the last 168 hours.   DDimer No results for input(s): DDIMER in the last 168 hours.   Radiology    PERIPHERAL VASCULAR CATHETERIZATION  Result Date: 02/02/2020 Successful complex covered stent placement to bilateral common iliac arteries which required additional stents into the distal aorta given that  the entry from the right side was high into the distal aorta. This was a very long procedure and exceeded fluoroscopy time given complexity.  The right common iliac artery was occluded and heavily calcified. 70 mL of contrast was used for the procedure. Recommendations: Dual antiplatelet therapy indefinitely given multiple stents. Gentle hydration overnight and recheck renal function in the morning. 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   Cardiac Studies   PV angiogram: 02/02/20  Successful complex covered stent placement to bilateral common iliac arteries which required additional stents into the distal aorta given that the entry from the right side was high into the distal aorta.  This was a very long procedure and exceeded fluoroscopy time given complexity.  The right common iliac artery was occluded and heavily calcified. 70 mL of contrast was used for the procedure.  Recommendations: Dual antiplatelet therapy indefinitely given multiple stents. Gentle hydration overnight and recheck renal function in the morning. 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   Patient Profile     74 y.o. male with PMH of HTN, HLD, DM, CKD stage 3, and PAD who presented for outpatient PV angiogram.   Assessment & Plan    1. PAD: underwent long, complex but successful stenting to bilateral common iliac arteries with additional stenting into the distal aorta. Plan for DAPT with ASA/plavix. Morning labs were stable. Significant improvement in pain. Plan to ambulate this morning with possible DC this afternoon.   2. HTN: stable with current therapy. Holding ARB this morning with elevated Cr.   3. HLD: on atorva 40mg  daily  4. CKD 3: Cr 2.2 this morning. Holding home ARB as above. Baseline appears around 1.5-2.0  5. DM: Hgb A1c 8.0, on Ozempic prior to admission. Plan to resume at  discharge. Further management per PCP  For questions or updates, please contact [facility] Please consult [email or web address] for contact info under        Signed, , NP  02/03/2020, 8:23 AM

## 2020-02-09 DIAGNOSIS — I739 Peripheral vascular disease, unspecified: Secondary | ICD-10-CM

## 2020-02-15 ENCOUNTER — Telehealth: Payer: Self-pay

## 2020-02-15 NOTE — Telephone Encounter (Signed)
Called the pt lmtcb.  Lab orders placed for the patient to have his [facility] (cbc, bmp[facility].  Patient needs to be scheduled for his post procedure PAD testing.  [facility] is holding two appt spots for the pt.  02/16/20 @ 1 pm for his ABI 02/24/20 @ 8:30am for his fasting Aorta/illiac/IVC duplex

## 2020-02-15 NOTE — Telephone Encounter (Signed)
[name], MD to [place], Milon Score, CMA . Me     02/09/20 5:20 PM I called the patient and discussed the situation with him. He has bruising affecting the left leg but there is no swelling or pain. No bruising on the right side. He has no back pain or leg pain with walking.  I explained to him that the bruising is not expected to disappear for at least another 2 weeks.  Please schedule him for CBC and basic metabolic profile to be done next week. He also needs to have an aortoiliac duplex and ABI.

## 2020-02-15 NOTE — Telephone Encounter (Signed)
See 02/15/20 telephone encounter for documentation.

## 2020-02-15 NOTE — Telephone Encounter (Signed)
Attempted to schedule.  lmov .     Can schedule abi 12/2 at 4 pm instead changed hold spot .

## 2020-02-16 ENCOUNTER — Other Ambulatory Visit
Admission: RE | Admit: 2020-02-16 | Discharge: 2020-02-16 | Disposition: A | Discharge status: Home / Self Care | Payer: Medicare Other | Attending: Cardiovascular Disease | Admitting: Cardiovascular Disease

## 2020-02-16 ENCOUNTER — Other Ambulatory Visit: Payer: Self-pay

## 2020-02-16 ENCOUNTER — Ambulatory Visit (INDEPENDENT_AMBULATORY_CARE_PROVIDER_SITE_OTHER): Payer: Medicare Other

## 2020-02-16 DIAGNOSIS — I97630 Postprocedural hematoma of a circulatory system organ or structure following a cardiac catheterization: Secondary | ICD-10-CM | POA: Insufficient documentation

## 2020-02-16 DIAGNOSIS — I739 Peripheral vascular disease, unspecified: Secondary | ICD-10-CM

## 2020-02-16 LAB — BASIC METABOLIC PANEL
Anion gap: 13 (ref 5–15)
BUN: 47 mg/dL — ABNORMAL HIGH (ref 8–23)
CO2: 21 mmol/L — ABNORMAL LOW (ref 22–32)
Calcium: 9.1 mg/dL (ref 8.9–10.3)
Chloride: 104 mmol/L (ref 98–111)
Creatinine, Ser: 2.18 mg/dL — ABNORMAL HIGH (ref 0.61–1.24)
GFR, Estimated: 31 mL/min — ABNORMAL LOW (ref >60)
Glucose, Bld: 295 mg/dL — ABNORMAL HIGH (ref 70–99)
Potassium: 4.1 mmol/L (ref 3.5–5.1)
Sodium: 138 mmol/L (ref 135–145)

## 2020-02-16 LAB — CBC WITH DIFFERENTIAL/PLATELET
Abs Immature Granulocytes: 0.05 10*3/uL (ref 0.00–0.07)
Basophils Absolute: 0 10*3/uL (ref 0.0–0.1)
Basophils Relative: 1 %
Eosinophils Absolute: 0.3 10*3/uL (ref 0.0–0.5)
Eosinophils Relative: 3 %
HCT: 35.9 % — ABNORMAL LOW (ref 39.0–52.0)
Hemoglobin: 12.2 g/dL — ABNORMAL LOW (ref 13.0–17.0)
Immature Granulocytes: 1 %
Lymphocytes Relative: 21 %
Lymphs Abs: 1.8 10*3/uL (ref 0.7–4.0)
MCH: 31.4 pg (ref 26.0–34.0)
MCHC: 34 g/dL (ref 30.0–36.0)
MCV: 92.3 fL (ref 80.0–100.0)
Monocytes Absolute: 0.5 10*3/uL (ref 0.1–1.0)
Monocytes Relative: 6 %
Neutro Abs: 6 10*3/uL (ref 1.7–7.7)
Neutrophils Relative %: 68 %
Platelets: 266 10*3/uL (ref 150–400)
RBC: 3.89 MIL/uL — ABNORMAL LOW (ref 4.22–5.81)
RDW: 14.1 % (ref 11.5–15.5)
WBC: 8.6 10*3/uL (ref 4.0–10.5)
nRBC: 0 % (ref 0.0–0.2)

## 2020-02-24 ENCOUNTER — Ambulatory Visit (INDEPENDENT_AMBULATORY_CARE_PROVIDER_SITE_OTHER): Payer: Medicare Other

## 2020-02-24 ENCOUNTER — Other Ambulatory Visit: Payer: Self-pay

## 2020-02-24 DIAGNOSIS — I739 Peripheral vascular disease, unspecified: Secondary | ICD-10-CM

## 2020-02-24 DIAGNOSIS — Z95828 Presence of other vascular implants and grafts: Secondary | ICD-10-CM | POA: Diagnosis not present

## 2020-02-25 ENCOUNTER — Other Ambulatory Visit: Payer: Self-pay

## 2020-02-25 DIAGNOSIS — I739 Peripheral vascular disease, unspecified: Secondary | ICD-10-CM

## 2020-03-02 ENCOUNTER — Encounter: Payer: Self-pay | Admitting: Nurse Practitioner

## 2020-03-02 ENCOUNTER — Other Ambulatory Visit: Payer: Self-pay

## 2020-03-02 ENCOUNTER — Ambulatory Visit: Payer: Medicare Other | Admitting: Nurse Practitioner

## 2020-03-02 VITALS — BP 132/70 | HR 80 | Ht 73.0 in | Wt 231.0 lb

## 2020-03-02 DIAGNOSIS — I5032 Chronic diastolic (congestive) heart failure: Secondary | ICD-10-CM

## 2020-03-02 DIAGNOSIS — I739 Peripheral vascular disease, unspecified: Secondary | ICD-10-CM | POA: Diagnosis not present

## 2020-03-02 DIAGNOSIS — I2510 Atherosclerotic heart disease of native coronary artery without angina pectoris: Secondary | ICD-10-CM

## 2020-03-02 DIAGNOSIS — E785 Hyperlipidemia, unspecified: Secondary | ICD-10-CM

## 2020-03-02 DIAGNOSIS — I10 Essential (primary) hypertension: Secondary | ICD-10-CM | POA: Diagnosis not present

## 2020-03-02 DIAGNOSIS — N183 Chronic kidney disease, stage 3 unspecified: Secondary | ICD-10-CM

## 2020-03-02 NOTE — Patient Instructions (Signed)
Medication Instructions:  Your physician recommends that you continue on your current medications as directed. Please refer to the Current Medication list given to you today.  *If you need a refill on your cardiac medications before your next appointment, please call your pharmacy*   Lab Work: None ordered    Testing/Procedures: Your physician has requested that you have an abdominal aorta-Iliac duplex in 6 months. During this test, an ultrasound is used to evaluate the aorta. Allow 30 minutes for this exam. Do not eat after midnight the day before and avoid carbonated beverages    Follow-Up: At [facility], you and your health needs are our priority.  As part of our continuing mission to provide you with exceptional heart care, we have created designated Provider Care Teams.  These Care Teams include your primary Cardiologist (physician) and Advanced Practice Providers (APPs -  Physician Assistants and Nurse Practitioners) who all work together to provide you with the care you need, when you need it.  We recommend signing up for the patient portal called "MyChart".  Sign up information is provided on this After Visit Summary.  MyChart is used to connect with patients for Virtual Visits (Telemedicine).  Patients are able to view lab/test results, encounter notes, upcoming appointments, etc.  Non-urgent messages can be sent to your provider as well.   To learn more about what you can do with MyChart, go to [email or web address].    Your next appointment:   6 month(s)  The format for your next appointment:   In Person  Provider:   [name], MD

## 2020-03-02 NOTE — Progress Notes (Signed)
Office Visit    Patient Name: James Clayton Date of Encounter: 03/02/2020  Primary Care Provider:  [name], MD Primary Cardiologist:  [name], MD  Chief Complaint    74 year old male with a history of coronary artery disease status post CABG x3 in November 2017 with postoperative atrial fibrillation, HFpEF, hypertension, hyperlipidemia, peripheral arterial disease status post lower extremity interventions, nonobstructive renal artery stenosis, diabetes, stage III chronic kidney disease, and obesity, who presents for follow-up after recent lower extremity stenting.  Past Medical History    Past Medical History:  Diagnosis Date  . CAD (coronary artery disease)    a. LHC 02/12/16: ostLM-LM 90% severely calcified, ost-mLAD 90%, ostRamus-Ramus 80%, ostLCx-pLCx 90%, ostRCA-pRCA 100% severely calcified, LVEDP severely elevated; b. 01/2016 CABG x 3 (LIMA->LAD, VG->PDA, VG->RI).   . Chronic diastolic CHF (congestive heart failure) (HCC)    a. echo 02/10/16: EF 50-55%, mild inf HK, GR2DD, mild to mod MR, PASP 46 mmHg; b. 04/2018 Echo: EF >55%. Triv AI. Mild TR.  . CKD (chronic kidney disease), stage III (HCC)   . Diabetes mellitus with complication (HCC)   . HLD (hyperlipidemia)   . Hypertensive heart disease   . Ileus (HCC)    a. 02/2016 following CABG.  . Morbid obesity (HCC)   . PAD (peripheral artery disease) (HCC)    a. 12/2018 Periph Angio: RCIA 99p, LCIA 80p; b. 02/2020 PV Angio/PTA: RRA 60, RCIA 100, RCFA mild dzs, LCIA 80, LIIA 40ost, LCFA mild dzs-->?s/p covered stenting to bilat CIAs w/ additional stents into distal Ao; c. 02/2020 post-procedure ABI/Duplex: nl ABIs w/ patent stents.  . Post-operative Atrial Fibrillation    a. 01/2016 following CABG.  . Renal artery stenosis (HCC)    a. 12/2018 Renal angio: RRA 50-60, LRA 60-->Med Rx.   Past Surgical History:  Procedure Laterality Date  . ABDOMINAL AORTOGRAM W/LOWER EXTREMITY N/A 02/02/2020   Procedure:  ABDOMINAL AORTOGRAM W/LOWER EXTREMITY;  Surgeon: [name], MD;  Location: [facility];  Service: Cardiovascular;  Laterality: N/A;  . CARDIAC CATHETERIZATION N/A 02/12/2016   Procedure: Left Heart Cath and Coronary Angiography;  Surgeon: [name], MD;  Location: [facility] INVASIVE CV LAB;  Service: Cardiovascular;  Laterality: N/A;  . COLONOSCOPY WITH PROPOFOL N/A 12/27/2016   Procedure: COLONOSCOPY WITH PROPOFOL;  Surgeon: [name], MD;  Location: [facility] ENDOSCOPY;  Service: Endoscopy;  Laterality: N/A;  . CORONARY ARTERY BYPASS GRAFT N/A 02/13/2016   Procedure: CORONARY ARTERY BYPASS GRAFTING (CABG)x 3 with endoscopic harvesting of right saphenous  vein LIMA to LAD SVG to Ramus SVG to PDA;  Surgeon: [name], MD;  Location: [facility] OR;  Service: Open Heart Surgery;  Laterality: N/A;  . JOINT REPLACEMENT    . PERIPHERAL VASCULAR INTERVENTION  02/02/2020   Procedure: PERIPHERAL VASCULAR INTERVENTION;  Surgeon: [name], MD;  Location: [facility];  Service: Cardiovascular;;  . RENAL ANGIOGRAPHY N/A 12/23/2018   Procedure: RENAL ANGIOGRAPHY;  Surgeon: [name], MD;  Location: [facility];  Service: Cardiovascular;  Laterality: N/A;  . TEE WITHOUT CARDIOVERSION N/A 02/13/2016   Procedure: TRANSESOPHAGEAL ECHOCARDIOGRAM (TEE);  Surgeon: [name], MD;  Location: [facility] OR;  Service: Open Heart Surgery;  Laterality: N/A;    Allergies  Allergies  Allergen Reactions  . Penicillin G     Other reaction(s): Other (See Comments) Passed out at age of 89    History of Present Illness    74 year old male  with above complex past medical history including coronary artery disease status post non-STEMI and CABG x 3 in November 2017, postoperative atrial fibrillation, HFpEF, hypertension, hyperlipidemia, peripheral arterial disease, nonobstructive renal artery stenosis, diabetes, stage III chronic kidney disease, and obesity.  In the setting of  refractory HTN, he underwent renal artery angiography in October 2020 which showed moderate bilateral renal artery stenosis estimated to be approximately 60% bilaterally.  During angiography, he was also noted to have bilateral common iliac stenoses-right greater than left.  Follow-up noninvasive vascular testing showed an ABI of 0.69 on the right and was normal on the left.  At his last office visit on November 5, he noted significant lifestyle limiting right leg claudication as well as a poorly healing ulcer on the bottom of his right foot related to callus and hammertoe deformity.  He underwent peripheral angiography in November revealing an occluded right common iliac artery, and 80% stenosis in the left common iliac artery, and mild disease in the common femoral arteries.  He underwent covered stenting to the bilateral common iliac arteries with additional stents into the distal aorta.  Lifelong dual antiplatelet therapy was recommended.  Post procedure ABI/aortoiliac duplex showed patent stents and normal ABIs.  Since his procedure, he has done well without any claudication.  He was seen by podiatry December 13 and ulceration was noted to be completely healed.  Though he had significant ecchymosis about the left groin site following procedure, this is more or less resolved and he only has mild right thigh ecchymosis and tenderness at this point.  He denies chest pain, palpitations, PND, orthopnea, dizziness, syncope, edema, or early satiety.  He does have chronic, mild and stable dyspnea on exertion  Home Medications    Prior to Admission medications   Medication Sig Start Date End Date Taking? Authorizing Provider  acetaminophen (TYLENOL) 500 MG tablet Take 500 mg by mouth daily.     [provider]  allopurinol (ZYLOPRIM) 100 MG tablet Take 100 mg by mouth 2 (two) times daily.  01/22/16   [provider]  amLODipine (NORVASC) 10 MG tablet Take 1 tablet (10 mg total) by mouth daily.  11/28/17 02/16/28  [name], MD  ascorbic acid (VITAMIN C) 500 MG tablet Take 500 mg by mouth daily.    [provider]  aspirin 81 MG EC tablet Take 1 tablet (81 mg total) by mouth daily. 12/29/18   [name], MD  atorvastatin (LIPITOR) 40 MG tablet Take 1 tablet (40 mg total) by mouth daily. 02/26/16   Ardelle Balls, PA-C  carvedilol (COREG) 25 MG tablet Take 1 tablet (25 mg total) 2 (two) times daily with a meal by mouth. 01/31/17 12/31/27  [name], MD  clopidogrel (PLAVIX) 75 MG tablet Take 1 tablet (75 mg total) by mouth daily. 01/21/20 01/20/21  [name], MD  furosemide (LASIX) 40 MG tablet Take 40 mg by mouth every other day.  01/17/20   [provider]  gabapentin (NEURONTIN) 300 MG capsule Take 300 mg by mouth 2 (two) times daily.    [provider]  gemfibrozil (LOPID) 600 MG tablet Take 600 mg by mouth 2 (two) times daily before a meal.  11/30/19 11/29/20  [provider]  hydrALAZINE (APRESOLINE) 25 MG tablet Take 25 mg by mouth 2 (two) times daily.  08/22/19   [provider]  hydrochlorothiazide (HYDRODIURIL) 25 MG tablet Take 25 mg by mouth daily.  09/08/19   [provider]  losartan (COZAAR) 100 MG tablet Take 100 mg by mouth daily. 12/03/19   [provider]  Multiple Vitamin (MULTIVITAMIN WITH MINERALS) TABS tablet Take 1 tablet by mouth daily.    [provider]  omega-3 acid ethyl esters (LOVAZA) 1 g capsule Take 1 capsule (1 g total) by mouth 2 (two) times daily. Patient taking differently: Take 1 g by mouth daily.  02/26/16   Doree FudgeZimmerman, Donielle M, PA-C  OZEMPIC, 1 MG/DOSE, 4 MG/3ML SOPN Inject 1 mg into the skin every Sunday.  01/12/20   [provider]  vitamin B-12 (CYANOCOBALAMIN) 1000 MCG tablet Take 1,000 mcg by mouth daily.    [provider]    Review of Systems    Chronic, stable dyspnea on exertion.  He denies chest pain, palpitations, PND,  orthopnea, dizziness, syncope, edema, early satiety, or claudication.  All other systems reviewed and are otherwise negative except as noted above.  Physical Exam    VS:  BP 132/70 (BP Location: Left Arm, Patient Position: Sitting, Cuff Size: Normal)   Pulse 80   Ht 6\' 1"  (1.854 m)   Wt 231 lb (104.8 kg)   SpO2 98%   BMI 30.48 kg/m  , BMI Body mass index is 30.48 kg/m. GEN: Well nourished, well developed, in no acute distress. HEENT: normal. Neck: Supple, no JVD, carotid bruits, or masses. Cardiac: RRR, 1/6 systolic murmur loudest at the upper sternal borders, no, rubs, or gallops. No clubbing, cyanosis, edema.  Radials 2+, DP/PT 1+ and equal bilaterally.  Respiratory:  Respirations regular and unlabored, clear to auscultation bilaterally. GI: Obese, soft, nontender, nondistended, BS + x 4. MS: no deformity or atrophy. Skin: warm and dry, no rash. Neuro:  Strength and sensation are intact. Psych: Normal affect.  Accessory Clinical Findings    ECG personally reviewed by me today -regular sinus rhythm, 80, nonspecific T changes - no acute changes.  Lab Results  Component Value Date   WBC 8.6 02/16/2020   HGB 12.2 (L) 02/16/2020   HCT 35.9 (L) 02/16/2020   MCV 92.3 02/16/2020   PLT 266 02/16/2020   Lab Results  Component Value Date   CREATININE 2.18 (H) 02/16/2020   BUN 47 (H) 02/16/2020   NA 138 02/16/2020   K 4.1 02/16/2020   CL 104 02/16/2020   CO2 21 (L) 02/16/2020   Lab Results  Component Value Date   ALT 29 02/23/2016   AST 18 02/23/2016   ALKPHOS 78 02/23/2016   BILITOT 0.7 02/23/2016   Labs from care everywhere: May 04, 2019: Total cholesterol 116, triglycerides 314, LDL 25 January 31, 2020: Sodium 143, potassium 3.8, chloride 104, CO2 30.8, BUN 47, creatinine 2.4, glucose 172, total bilirubin 0.4, alkaline phosphatase 85, AST 19, ALT 31 January 30, 2020: Hemoglobin A1c 7.5   Assessment & Plan    1.  Peripheral arterial disease: Status post  recent peripheral angiography in the setting of abnormal ABIs on the right with known bilateral common iliac disease.  He was treated with covered stents to the bilateral common iliacs with additional stenting into the distal aorta.  Post procedure aortoiliac duplex and ABIs showed patent stents and good flow distally.  He has not had any claudication and has noted healing of ulceration on the bottom of his right foot.  He remains on statin and lifelong dual antiplatelet therapy in the setting of multiple stents.  He will be scheduled for repeat aortoiliac duplex in 6 months.  2.  Coronary artery disease: Status  post CABG x3 in 2017.  No chest pain.  He does have chronic, stable dyspnea on exertion and now that he no longer has claudication is looking forward to walking more and getting back to playing golf.  He remains on aspirin, Plavix, beta-blocker, and statin therapy.  3.  Essential hypertension: List of home pressures today show stability, generally in the 120-1 30 range with infrequent lows in the 1 teens and highs in the 160s.  He is 132/70 today.  He remains on amlodipine, carvedilol, hydralazine, and HCTZ.  4.  Hyperlipidemia/hypertriglyceridemia: Remains on statin and Lovaza therapy with an LDL of 25 in February of this year.  Triglycerides 437 in November.  LFTs were normal in November.  5.  Type 2 diabetes mellitus: A1c 7.5 in September.  Followed by primary care.  6.  HFpEF: Euvolemic on examination.  Chronic, stable dyspnea.  Heart rate and blood pressure stable.  7.  Stage III chronic kidney disease: Creatinine stable post peripheral angiography at 2.18 on December 1.  8.  Disposition: Follow-up aortoiliac duplex as planned in approximately 6 months with follow-up with Dr. Kirke Corin shortly thereafter.   Nicolasa Ducking, NP 03/02/2020, 9:46 AM

## 2020-05-16 ENCOUNTER — Other Ambulatory Visit: Payer: Self-pay | Admitting: Internal Medicine

## 2020-05-16 DIAGNOSIS — I2510 Atherosclerotic heart disease of native coronary artery without angina pectoris: Secondary | ICD-10-CM

## 2020-05-16 DIAGNOSIS — R42 Dizziness and giddiness: Secondary | ICD-10-CM

## 2020-05-31 ENCOUNTER — Other Ambulatory Visit: Payer: Self-pay

## 2020-05-31 ENCOUNTER — Ambulatory Visit
Admission: RE | Admit: 2020-05-31 | Discharge: 2020-05-31 | Disposition: A | Discharge status: Home / Self Care | Payer: Medicare Other | Source: Ambulatory Visit | Attending: Internal Medicine | Admitting: Internal Medicine

## 2020-05-31 DIAGNOSIS — R42 Dizziness and giddiness: Secondary | ICD-10-CM | POA: Diagnosis not present

## 2020-05-31 DIAGNOSIS — I2510 Atherosclerotic heart disease of native coronary artery without angina pectoris: Secondary | ICD-10-CM | POA: Diagnosis present

## 2020-08-29 ENCOUNTER — Ambulatory Visit: Payer: Medicare Other | Admitting: Nurse Practitioner

## 2020-08-29 ENCOUNTER — Other Ambulatory Visit: Payer: Self-pay | Admitting: Cardiovascular Disease

## 2020-08-29 DIAGNOSIS — I739 Peripheral vascular disease, unspecified: Secondary | ICD-10-CM

## 2020-08-31 ENCOUNTER — Ambulatory Visit (INDEPENDENT_AMBULATORY_CARE_PROVIDER_SITE_OTHER): Payer: Medicare Other

## 2020-08-31 ENCOUNTER — Other Ambulatory Visit: Payer: Self-pay

## 2020-08-31 DIAGNOSIS — I739 Peripheral vascular disease, unspecified: Secondary | ICD-10-CM | POA: Diagnosis not present

## 2020-08-31 DIAGNOSIS — Z95828 Presence of other vascular implants and grafts: Secondary | ICD-10-CM

## 2020-09-04 ENCOUNTER — Other Ambulatory Visit: Payer: Self-pay | Admitting: *Deleted

## 2020-09-04 DIAGNOSIS — I739 Peripheral vascular disease, unspecified: Secondary | ICD-10-CM

## 2020-09-21 ENCOUNTER — Encounter: Payer: Self-pay | Admitting: Nurse Practitioner

## 2020-09-21 ENCOUNTER — Ambulatory Visit: Payer: Medicare Other | Admitting: Nurse Practitioner

## 2020-09-21 ENCOUNTER — Other Ambulatory Visit: Payer: Self-pay

## 2020-09-21 VITALS — BP 150/80 | HR 84 | Ht 73.0 in | Wt 224.0 lb

## 2020-09-21 DIAGNOSIS — I10 Essential (primary) hypertension: Secondary | ICD-10-CM | POA: Diagnosis not present

## 2020-09-21 DIAGNOSIS — E785 Hyperlipidemia, unspecified: Secondary | ICD-10-CM

## 2020-09-21 DIAGNOSIS — I2510 Atherosclerotic heart disease of native coronary artery without angina pectoris: Secondary | ICD-10-CM

## 2020-09-21 DIAGNOSIS — I739 Peripheral vascular disease, unspecified: Secondary | ICD-10-CM | POA: Diagnosis not present

## 2020-09-21 DIAGNOSIS — N183 Chronic kidney disease, stage 3 unspecified: Secondary | ICD-10-CM

## 2020-09-21 DIAGNOSIS — I5032 Chronic diastolic (congestive) heart failure: Secondary | ICD-10-CM

## 2020-09-21 DIAGNOSIS — E118 Type 2 diabetes mellitus with unspecified complications: Secondary | ICD-10-CM

## 2020-09-21 NOTE — Progress Notes (Signed)
Office Visit    Patient Name: James Clayton Clayton Date of Encounter: 09/21/2020  Primary Care Provider:  Marguarite ArbourSparks, James Clayton D, MD Primary Cardiologist:  James Clayton BearsMuhammad Arida, MD  Chief Complaint    75 year old male with a history of CAD status post CABG times 19 January 2016 with postoperative atrial fibrillation, HFpEF, hypertension, hyperlipidemia, peripheral arterial disease status post lower extremity interventions, nonobstructive renal artery stenosis, diabetes, stage III-IV chronic kidney disease, and obesity, presents for follow-up of CAD and peripheral arterial disease.  Past Medical History    Past Medical History:  Diagnosis Date   CAD (coronary artery disease)    a. LHC 02/12/16: ostLM-LM 90% severely calcified, ost-mLAD 90%, ostRamus-Ramus 80%, ostLCx-pLCx 90%, ostRCA-pRCA 100% severely calcified, LVEDP severely elevated; b. 01/2016 CABG x 3 (LIMA->LAD, VG->PDA, VG->RI).    Chronic diastolic CHF (congestive heart failure) (HCC)    a. echo 02/10/16: EF 50-55%, mild inf HK, GR2DD, mild to mod MR, PASP 46 mmHg; b. 04/2018 Echo: EF >55%. Triv AI. Mild TR.   CKD (chronic kidney disease), stage III (HCC)    Diabetes mellitus with complication (HCC)    HLD (hyperlipidemia)    Hypertensive heart disease    Ileus (HCC)    a. 02/2016 following CABG.   Morbid obesity (HCC)    PAD (peripheral artery disease) (HCC)    a. 12/2018 Periph Angio: RCIA 99p, LCIA 80p; b. 01/2020 PV Angio/PTA: RRA 60, RCIA 100, RCFA mild dzs, LCIA 80, LIIA 40ost, LCFA mild dzs-->?s/p covered stenting to bilat CIAs w/ additional stents into distal Ao; c. 08/2020 ABIs: R 1.05, L 1.00. Patent iliac stents.   Post-operative Atrial Fibrillation    a. 01/2016 following CABG.   Renal artery stenosis (HCC)    a. 12/2018 Renal angio: RRA 50-60, LRA 60-->Med Rx.   Past Surgical History:  Procedure Laterality Date   ABDOMINAL AORTOGRAM W/LOWER EXTREMITY N/A 02/02/2020   Procedure: ABDOMINAL AORTOGRAM W/LOWER EXTREMITY;   Surgeon: James Clayton Clayton, Muhammad A, MD;  Location: [facility];  Service: Cardiovascular;  Laterality: N/A;   CARDIAC CATHETERIZATION N/A 02/12/2016   Procedure: Left Heart Cath and Coronary Angiography;  Surgeon: James OuchMuhammad A Arida, MD;  Location: [facility] INVASIVE CV LAB;  Service: Cardiovascular;  Laterality: N/A;   COLONOSCOPY WITH PROPOFOL N/A 12/27/2016   Procedure: COLONOSCOPY WITH PROPOFOL;  Surgeon: James JunElliott, James Clayton T, MD;  [facility];  Service: Endoscopy;  Laterality: N/A;   CORONARY ARTERY BYPASS GRAFT N/A 02/13/2016   Procedure: CORONARY ARTERY BYPASS GRAFTING (CABG)x 3 with endoscopic harvesting of right saphenous  vein LIMA to LAD SVG to Ramus SVG to PDA;  Surgeon: James Clayton PernaPeter Van Trigt, MD;  [facility];  Service: Open Heart Surgery;  Laterality: N/A;   JOINT REPLACEMENT     PERIPHERAL VASCULAR INTERVENTION  02/02/2020   Procedure: PERIPHERAL VASCULAR INTERVENTION;  Surgeon: James Clayton Clayton, Muhammad A, MD;  Location: [facility];  Service: Cardiovascular;;   RENAL ANGIOGRAPHY N/A 12/23/2018   Procedure: RENAL ANGIOGRAPHY;  Surgeon: James Clayton Clayton, Muhammad A, MD;  Location: [facility];  Service: Cardiovascular;  Laterality: N/A;   TEE WITHOUT CARDIOVERSION N/A 02/13/2016   Procedure: TRANSESOPHAGEAL ECHOCARDIOGRAM (TEE);  Surgeon: James Clayton PernaPeter Van Trigt, MD;  [facility];  Service: Open Heart Surgery;  Laterality: N/A;    Allergies  Allergies  Allergen Reactions   Penicillin G     Other reaction(s): Other (See Comments) Passed out at age of 75    History of Present Illness    75 year old male with  the above complex past medical history including CAD status post non-STEMI and CABG x3 in November 2017, postoperative atrial fibrillation, HFpEF, hypertension, hyperlipidemia, peripheral arterial disease, nonobstructive renal artery stenosis, diabetes, stage III-IV chronic kidney disease, and obesity.  In the setting of refractory hypertension, he underwent renal artery angiography  in October 2020 which showed moderate renal artery stenosis estimated to be approximately 60% bilaterally.  During angiography, he was also noted to have bilateral common iliac stenoses-right greater than left.  Noninvasive vascular testing showed an ABI of 0.69 on the right and was normal on the left.  In the setting of lifestyle limiting right lower extremity claudication and poorly healing ulceration on the right foot, he underwent peripheral angiography in November 2021 showing an occluded right common iliac artery, and 80% stenosis in the left common iliac artery, and mild disease in the common femoral arteries.  He underwent covered stenting of the bilateral common iliac arteries with additional stents in the distal aorta.  Lifelong dual antiplatelet therapy was recommended.  Post procedure ABI/aortoiliac duplex showed patent stents and normal ABIs.  Following intervention, he had complete healing of his right foot ulceration.  Repeat lower extremity vascular studies in June of this year showed normal ABI and patent iliac stent with recommendation for follow-up studies in 1 year.  James Clayton Clayton was last seen in cardiology clinic in December 2021.  He continues to do well w/o chest pain, or claudication.  Activity has been somewhat more limited this summer in the setting of being very hot and his golf partner having had shoulder surgery.  He denies dyspnea, palpitations, PND, orthopnea, dizziness, syncope, edema, or early satiety.  Home Medications    Prior to Admission medications   Medication Sig Start Date End Date Taking? Authorizing Provider  acetaminophen (TYLENOL) 500 MG tablet Take 500 mg by mouth daily.   Yes [provider]  allopurinol (ZYLOPRIM) 100 MG tablet Take 100 mg by mouth 2 (two) times daily.  01/22/16  Yes [provider]  amLODipine (NORVASC) 10 MG tablet Take 1 tablet (10 mg total) by mouth daily. 11/28/17 02/16/28 Yes James Ouch, MD  ascorbic acid  (VITAMIN C) 500 MG tablet Take 500 mg by mouth daily.   Yes [provider]  aspirin 81 MG EC tablet Take 1 tablet (81 mg total) by mouth daily. 12/29/18  Yes James Ouch, MD  atorvastatin (LIPITOR) 40 MG tablet Take 1 tablet (40 mg total) by mouth daily. 02/26/16  Yes Doree Fudge M, PA-C  carvedilol (COREG) 25 MG tablet Take 1 tablet (25 mg total) 2 (two) times daily with a meal by mouth. 01/31/17 12/31/27 Yes James Ouch, MD  clopidogrel (PLAVIX) 75 MG tablet Take 1 tablet (75 mg total) by mouth daily. 01/21/20 01/20/21 Yes James Ouch, MD  gabapentin (NEURONTIN) 300 MG capsule Take 300 mg by mouth 2 (two) times daily.   Yes [provider]  gemfibrozil (LOPID) 600 MG tablet Take 600 mg by mouth 2 (two) times daily before a meal.  11/30/19 11/29/20 Yes [provider]  hydrALAZINE (APRESOLINE) 25 MG tablet Take 25 mg by mouth 2 (two) times daily.  08/22/19  Yes [provider]  hydrochlorothiazide (HYDRODIURIL) 25 MG tablet Take 25 mg by mouth daily.  09/08/19  Yes [provider]  losartan (COZAAR) 100 MG tablet Take 100 mg by mouth daily. 12/03/19  Yes [provider]  Multiple Vitamin (MULTIVITAMIN WITH MINERALS) TABS tablet Take 1 tablet by mouth  daily.   Yes [provider]  omega-3 acid ethyl esters (LOVAZA) 1 g capsule Take 1 capsule (1 g total) by mouth 2 (two) times daily. 02/26/16  Yes Zimmerman, Donielle M, PA-C  Semaglutide, 2 MG/DOSE, (OZEMPIC, 2 MG/DOSE,) 8 MG/3ML SOPN Inject into the skin once a week.   Yes [provider]     Review of Systems    He denies chest pain, palpitations, dyspnea, pnd, orthopnea, n, v, dizziness, syncope, edema, weight gain, or early satiety.  All other systems reviewed and are otherwise negative except as noted above.  Physical Exam    VS:  BP (!) 150/80 (BP Location: Left Arm, Patient Position: Sitting, Cuff Size: Large)   Pulse 84   Ht 6\' 1"  (1.854 m)   Wt  224 lb (101.6 kg)   SpO2 98%   BMI 29.55 kg/m  , BMI Body mass index is 29.55 kg/m. GEN: Well nourished, well developed, in no acute distress. HEENT: normal. Neck: Supple, no JVD, carotid bruits, or masses. Cardiac: RRR, 2/6 SEM, no rubs, or gallops. No clubbing, cyanosis, trace left greater than right ankle edema.  Radials 2+/PT 1+ and equal bilaterally.  Respiratory:  Respirations regular and unlabored, clear to auscultation bilaterally. GI: Soft, nontender, nondistended, BS + x 4. MS: no deformity or atrophy. Skin: warm and dry, no rash. Neuro:  Strength and sensation are intact. Psych: Normal affect.  Accessory Clinical Findings    ECG personally reviewed by me today -regular sinus rhythm, 84- no acute changes.  Labs from Care Everywhere:  August 24, 2020: Hemoglobin 12.9, hematocrit 38.6, WBC 5.2, platelets 157 Sodium 138, potassium 3.7, chloride 102, CO2 27, BUN 32, creatinine 2.32, glucose 128 Calcium 9.1, phosphorus 3.5, albumin 3.8  July 13, 2020: Hemoglobin A1c 9.5  July 12, 2020: Total cholesterol 134, triglycerides 364, HDL 26.7, LDL 35  Assessment & Plan    1.  Peripheral arterial disease: Status post bilateral common iliac and distal aortic stenting in late 2021 with recent stable ABIs.  He has been feeling well without any significant claudication.  He previously had complete healing of right foot ulceration.  He remains on lifelong dual antiplatelet therapy, as well as statin.  Follow-up ABIs in a year.  2.  Coronary artery disease: Status post CABG x3 in 2017.  He has been doing well without chest pain or dyspnea.  He remains on aspirin, Plavix, beta-blocker, and statin therapy.  3.  Essential hypertension: Blood pressure elevated today however, he runs in the 120-130 range at home and was recently normal at nephrology visit.  He will continue to follow at home and otherwise continue beta-blocker, ARB, HCTZ, hydralazine, and amlodipine.  He will contact 2018 if  pressures trend higher at home, at which time we can consider 3 times daily hydralazine instead of twice daily.  4.  Hyperlipidemia/hypertriglyceridemia: Triglycerides slightly improved at 364 in April with LDL at goal-35.  He remains on statin therapy.  Discussed the importance of weight loss and diabetes management.  5.  Type 2 diabetes mellitus: A1c 9.5 in April.  Followed by primary care.  6.  HFpEF: Euvolemic on examination with only minimal ankle edema.  He is no longer on Lasix at the preference of nephrology.  Heart rate stable while blood pressure is moderately elevated today though he notes it is more normal at home.  He will continue to follow at home.  7.  Stage III chronic kidney disease: Creatinine stable at 2.32.  Followed by nephrology.  He remains on ARB therapy.  8.  Disposition: He will follow-up in 6 months.  Follow-up ABIs in 1 year.   Nicolasa Ducking, NP 09/21/2020, 5:51 PM

## 2020-09-21 NOTE — Patient Instructions (Signed)
Medication Instructions:  ?No changes at this time.  ? ?*If you need a refill on your cardiac medications before your next appointment, please call your pharmacy* ? ? ?Lab Work: ?None ? ?If you have labs (blood work) drawn today and your tests are completely normal, you will receive your results only by: ?MyChart Message (if you have MyChart) OR ?A paper copy in the mail ?If you have any lab test that is abnormal or we need to change your treatment, we will call you to review the results. ? ? ?Testing/Procedures: ?None ? ? ?Follow-Up: ?At [facility], you and your health needs are our priority.  As part of our continuing mission to provide you with exceptional heart care, we have created designated Provider Care Teams.  These Care Teams include your primary Cardiologist (physician) and Advanced Practice Providers (APPs -  Physician Assistants and Nurse Practitioners) who all work together to provide you with the care you need, when you need it. ? ? ?Your next appointment:   ?6 month(s) ? ?The format for your next appointment:   ?In Person ? ?Provider:   ?Muhammad Arida, MD or Christopher Berge, NP ?

## 2020-12-31 ENCOUNTER — Other Ambulatory Visit: Payer: Self-pay | Admitting: Cardiovascular Disease

## 2021-03-19 ENCOUNTER — Encounter: Payer: Self-pay | Admitting: Cardiovascular Disease

## 2021-03-22 ENCOUNTER — Other Ambulatory Visit: Payer: Self-pay

## 2021-03-22 ENCOUNTER — Ambulatory Visit: Payer: Medicare Other | Admitting: Cardiovascular Disease

## 2021-03-22 ENCOUNTER — Encounter: Payer: Self-pay | Admitting: Cardiovascular Disease

## 2021-03-22 VITALS — BP 124/64 | HR 83 | Ht 73.0 in | Wt 212.0 lb

## 2021-03-22 DIAGNOSIS — I10 Essential (primary) hypertension: Secondary | ICD-10-CM | POA: Diagnosis not present

## 2021-03-22 DIAGNOSIS — I2510 Atherosclerotic heart disease of native coronary artery without angina pectoris: Secondary | ICD-10-CM | POA: Diagnosis not present

## 2021-03-22 DIAGNOSIS — E785 Hyperlipidemia, unspecified: Secondary | ICD-10-CM | POA: Diagnosis not present

## 2021-03-22 DIAGNOSIS — I701 Atherosclerosis of renal artery: Secondary | ICD-10-CM

## 2021-03-22 DIAGNOSIS — I739 Peripheral vascular disease, unspecified: Secondary | ICD-10-CM | POA: Diagnosis not present

## 2021-03-22 NOTE — Progress Notes (Signed)
Cardiology Office Note   Date:  03/22/2021   ID:  TAD FANCHER, DOB 13-Aug-1945, MRN [id number]  PCP:  [name], MD  Cardiologist:   [name], MD   Chief Complaint  Patient presents with   Other    6 month f/u no complaints today. Meds reviewed verbally with pt.      History of Present Illness: James Clayton is a 76 y.o. male who presents for a follow-up visit regarding coronary artery disease and peripheral arterial disease. He has chronic medical conditions that include hypertension, hyperlipidemia, type 2 diabetes, stage III chronic kidney disease and obesity. 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 Cardiac catheterization showed severe three-vessel and left main disease. Echo showed normal LV systolic function.   He has known history of refractory hypertension.  Renal artery duplex was suggestive of possible bilateral renal artery stenosis.  Angiography in October 2020 showed moderate bilateral renal artery stenosis estimated to be 60% bilaterally.   The patient is known to have severe bilateral common iliac artery disease noted on angiography in October 2020.  Subsequently, his right common iliac artery was noted to be occluded.  I performed successful covered stent placement to bilateral common iliac arteries with extension into the distal aorta in November 2021. He has been doing reasonably well with no recent chest pain or shortness of breath.  He denies leg claudication.  He was diagnosed with COVID 10 days ago and received antibiotics.  He is feeling close to baseline.   Past Medical History:  Diagnosis Date   CAD (coronary artery disease)    a. LHC 02/12/16: ostLM-LM 90% severely calcified, ost-mLAD 90%, ostRamus-Ramus 80%, ostLCx-pLCx 90%, ostRCA-pRCA 100% severely calcified, LVEDP severely elevated; b. 01/2016 CABG x 3 (LIMA->LAD, VG->PDA, VG->RI).    Chronic diastolic CHF (congestive  heart failure) (HCC)    a. echo 02/10/16: EF 50-55%, mild inf HK, GR2DD, mild to mod MR, PASP 46 mmHg; b. 04/2018 Echo: EF >55%. Triv AI. Mild TR.   CKD (chronic kidney disease), stage III (HCC)    Diabetes mellitus with complication (HCC)    HLD (hyperlipidemia)    Hypertensive heart disease    Ileus (HCC)    a. 02/2016 following CABG.   Morbid obesity (HCC)    PAD (peripheral artery disease) (HCC)    a. 12/2018 Periph Angio: RCIA 99p, LCIA 80p; b. 01/2020 PV Angio/PTA: RRA 60, RCIA 100, RCFA mild dzs, LCIA 80, LIIA 40ost, LCFA mild dzs-->?s/p covered stenting to bilat CIAs w/ additional stents into distal Ao; c. 08/2020 ABIs: R 1.05, L 1.00. Patent iliac stents.   Post-operative Atrial Fibrillation    a. 01/2016 following CABG.   Renal artery stenosis (HCC)    a. 12/2018 Renal angio: RRA 50-60, LRA 60-->Med Rx.    Past Surgical History:  Procedure Laterality Date   ABDOMINAL AORTOGRAM W/LOWER EXTREMITY N/A 02/02/2020   Procedure: ABDOMINAL AORTOGRAM W/LOWER EXTREMITY;  Surgeon: [name], MD;  [facility]: [facility];  Service: Cardiovascular;  Laterality: N/A;   CARDIAC CATHETERIZATION N/A 02/12/2016   Procedure: Left Heart Cath and Coronary Angiography;  Surgeon: [name], MD;  Location: [facility] INVASIVE CV LAB;  Service: Cardiovascular;  Laterality: N/A;   COLONOSCOPY WITH PROPOFOL N/A 12/27/2016   Procedure: COLONOSCOPY WITH PROPOFOL;  Surgeon: [name], MD;  Location: [facility] ENDOSCOPY;  Service: Endoscopy;  Laterality: N/A;   CORONARY ARTERY BYPASS GRAFT N/A 02/13/2016  Procedure: CORONARY ARTERY BYPASS GRAFTING (CABG)x 3 with endoscopic harvesting of right saphenous  vein LIMA to LAD SVG to Ramus SVG to PDA;  Surgeon: Kerin PernaPeter Van Trigt, MD;  [facility];  Service: Open Heart Surgery;  Laterality: N/A;   JOINT REPLACEMENT     PERIPHERAL VASCULAR INTERVENTION  02/02/2020   Procedure: PERIPHERAL VASCULAR INTERVENTION;  Surgeon: Iran OuchArida, Theodore Virgin A, MD;   Location: [facility];  Service: Cardiovascular;;   RENAL ANGIOGRAPHY N/A 12/23/2018   Procedure: RENAL ANGIOGRAPHY;  Surgeon: Iran OuchArida, Meshell Abdulaziz A, MD;  Location: [facility];  Service: Cardiovascular;  Laterality: N/A;   TEE WITHOUT CARDIOVERSION N/A 02/13/2016   Procedure: TRANSESOPHAGEAL ECHOCARDIOGRAM (TEE);  Surgeon: Kerin PernaPeter Van Trigt, MD;  [facility];  Service: Open Heart Surgery;  Laterality: N/A;     Current Outpatient Medications  Medication Sig Dispense Refill   acetaminophen (TYLENOL) 500 MG tablet Take 500 mg by mouth daily.     allopurinol (ZYLOPRIM) 100 MG tablet Take 100 mg by mouth 2 (two) times daily.   12   amLODipine (NORVASC) 10 MG tablet Take 1 tablet (10 mg total) by mouth daily. 90 tablet 3   amLODipine (NORVASC) 10 MG tablet Take 1 tablet by mouth daily.     ascorbic acid (VITAMIN C) 500 MG tablet Take 500 mg by mouth daily.     aspirin 81 MG EC tablet Take 1 tablet (81 mg total) by mouth daily.     atorvastatin (LIPITOR) 40 MG tablet Take 1 tablet (40 mg total) by mouth daily. 30 tablet 1   carvedilol (COREG) 25 MG tablet Take 1 tablet (25 mg total) 2 (two) times daily with a meal by mouth. 180 tablet 1   clopidogrel (PLAVIX) 75 MG tablet TAKE ONE TABLET DAILY 30 tablet 2   empagliflozin (JARDIANCE) 25 MG TABS tablet Take 25 mg by mouth daily.     gabapentin (NEURONTIN) 300 MG capsule Take 300 mg by mouth 2 (two) times daily.     hydrALAZINE (APRESOLINE) 25 MG tablet Take 25 mg by mouth 2 (two) times daily.      hydrochlorothiazide (HYDRODIURIL) 25 MG tablet Take 25 mg by mouth daily.      losartan (COZAAR) 100 MG tablet Take 100 mg by mouth daily.     Multiple Vitamin (MULTIVITAMIN WITH MINERALS) TABS tablet Take 1 tablet by mouth daily.     omega-3 acid ethyl esters (LOVAZA) 1 g capsule Take 1 capsule (1 g total) by mouth 2 (two) times daily.     Semaglutide, 2 MG/DOSE, (OZEMPIC, 2 MG/DOSE,) 8 MG/3ML SOPN Inject into the skin once a week.     No  current facility-administered medications for this visit.    Allergies:   Penicillin g    Social History:  The patient  reports that he has quit smoking. He has never used smokeless tobacco. He reports that he does not drink alcohol and does not use drugs.   Family History:  The patient's family history includes Cancer in his mother; Heart disease in his father.    ROS:  Please see the history of present illness.   Otherwise, review of systems are positive for none.   All other systems are reviewed and negative.    PHYSICAL EXAM: VS:  BP 124/64 (BP Location: Left Arm, Patient Position: Sitting, Cuff Size: Normal)    Pulse 83    Ht 6\' 1"  (1.854 m)    Wt 212 lb (96.2 kg)  SpO2 98%    BMI 27.97 kg/m  , BMI Body mass index is 27.97 kg/m. GEN: Well nourished, well developed, in no acute distress  HEENT: normal  Neck: no JVD, carotid bruits, or masses Cardiac: RRR; no  rubs, or gallops. 2/ 6 systolic ejection murmur at the base. There is mild bilateral leg edema Respiratory:  clear to auscultation bilaterally, normal work of breathing GI: soft, nontender, nondistended, + BS MS: no deformity or atrophy  Skin: warm and dry, no rash Neuro:  Strength and sensation are intact Psych: euthymic mood, full affect Femoral pulses are normal bilaterally.  EKG:  EKG is ordered today. The ekg ordered today demonstrates normal sinus rhythm with no significant ST or T wave changes.   Recent Labs: No results found for requested labs within last 8760 hours.    Lipid Panel    Component Value Date/Time   CHOL 105 02/13/2016 0508   TRIG 173 (H) 02/20/2016 0440   HDL 21 (L) 02/13/2016 0508   CHOLHDL 5.0 02/13/2016 0508   VLDL 47 (H) 02/13/2016 0508   LDLCALC 37 02/13/2016 0508      Wt Readings from Last 3 Encounters:  03/22/21 212 lb (96.2 kg)  09/21/20 224 lb (101.6 kg)  03/02/20 231 lb (104.8 kg)       PAD Screen 03/07/2016  Previous PAD dx? No  Previous surgical procedure? No   Pain with walking? Yes  Subsides with rest? Yes  Feet/toe relief with dangling? No  Painful, non-healing ulcers? No  Extremities discolored? No      ASSESSMENT AND PLAN:  1. Peripheral arterial disease: Status post bilateral common iliac artery stent placement extending into the distal aorta.  He is doing well with no claudication and has normal femoral pulses.  Given multiple overlapped covered stents, the plan is to keep him on long-term dual antiplatelet therapy.  Most recent ABI was normal and we will plan on repeat Doppler studies in June of this year.    2. Coronary artery disease involving native coronary arteries without angina: Chronic atypical musculoskeletal chest pain.  No convincing symptoms of angina.  Continue medical therapy.  3. Essential hypertension: Blood pressure is reasonably controlled on current medications.  4.  Mixed hyperlipidemia: Currently on atorvastatin, gemfibrozil and Lovaza.  I reviewed most recent lipid profile which showed very low LDL although his triglyceride continues to be mildly elevated.  5.  Moderate bilateral renal artery stenosis: No indication for revascularization.    Disposition:   FU with me in 6 months  Signed,  [name], MD  03/22/2021 9:19 AM    [facility]

## 2021-03-22 NOTE — Patient Instructions (Signed)
Medication Instructions:  Your physician recommends that you continue on your current medications as directed. Please refer to the Current Medication list given to you today.  *If you need a refill on your cardiac medications before your next appointment, please call your pharmacy*   Lab Work: None ordered If you have labs (blood work) drawn today and your tests are completely normal, you will receive your results only by: MyChart Message (if you have MyChart) OR A paper copy in the mail If you have any lab test that is abnormal or we need to change your treatment, we will call you to review the results.   Testing/Procedures: Your physician recommends that you have an an aorta/ivc/iliac duplex   Your physician has requested that you have an ankle brachial index (ABI). During this test an ultrasound and blood pressure cuff are used to evaluate the arteries that supply the arms and legs with blood. Allow thirty minutes for this exam. There are no restrictions or special instructions.  (Testing to be scheduled in June 2023)    Follow-Up: At [facility], you and your health needs are our priority.  As part of our continuing mission to provide you with exceptional heart care, we have created designated Provider Care Teams.  These Care Teams include your primary Cardiologist (physician) and Advanced Practice Providers (APPs -  Physician Assistants and Nurse Practitioners) who all work together to provide you with the care you need, when you need it.  We recommend signing up for the patient portal called "MyChart".  Sign up information is provided on this After Visit Summary.  MyChart is used to connect with patients for Virtual Visits (Telemedicine).  Patients are able to view lab/test results, encounter notes, upcoming appointments, etc.  Non-urgent messages can be sent to your provider as well.   To learn more about what you can do with MyChart, go to [email or web address].    Your next  appointment:   June 2023  (To be schedule after testing)  The format for your next appointment:   In Person  Provider:   You may see [name], MD or one of the following Advanced Practice Providers on your designated Care Team:   Nicolasa Ducking, NP Eula Listen, PA-C Cadence Fransico Michael, PA-C{    Other Instructions N/A

## 2021-08-22 ENCOUNTER — Other Ambulatory Visit: Payer: Self-pay | Admitting: Cardiovascular Disease

## 2021-08-22 DIAGNOSIS — I739 Peripheral vascular disease, unspecified: Secondary | ICD-10-CM

## 2021-09-04 ENCOUNTER — Ambulatory Visit (INDEPENDENT_AMBULATORY_CARE_PROVIDER_SITE_OTHER): Payer: Medicare Other

## 2021-09-04 DIAGNOSIS — I739 Peripheral vascular disease, unspecified: Secondary | ICD-10-CM | POA: Diagnosis not present

## 2021-09-04 DIAGNOSIS — Z95828 Presence of other vascular implants and grafts: Secondary | ICD-10-CM

## 2021-09-06 ENCOUNTER — Encounter: Payer: Self-pay | Admitting: Cardiovascular Disease

## 2021-09-06 ENCOUNTER — Ambulatory Visit: Payer: Medicare Other | Admitting: Cardiovascular Disease

## 2021-09-06 VITALS — BP 130/80 | HR 76 | Ht 73.0 in | Wt 212.5 lb

## 2021-09-06 DIAGNOSIS — I739 Peripheral vascular disease, unspecified: Secondary | ICD-10-CM | POA: Diagnosis not present

## 2021-09-06 DIAGNOSIS — I2510 Atherosclerotic heart disease of native coronary artery without angina pectoris: Secondary | ICD-10-CM | POA: Diagnosis not present

## 2021-09-06 DIAGNOSIS — I701 Atherosclerosis of renal artery: Secondary | ICD-10-CM

## 2021-09-06 DIAGNOSIS — I10 Essential (primary) hypertension: Secondary | ICD-10-CM | POA: Diagnosis not present

## 2021-09-06 DIAGNOSIS — E782 Mixed hyperlipidemia: Secondary | ICD-10-CM | POA: Diagnosis not present

## 2021-09-06 NOTE — Patient Instructions (Signed)
Medication Instructions:  Your physician recommends that you continue on your current medications as directed. Please refer to the Current Medication list given to you today.  *If you need a refill on your cardiac medications before your next appointment, please call your pharmacy*   Lab Work: None ordered If you have labs (blood work) drawn today and your tests are completely normal, you will receive your results only by: MyChart Message (if you have MyChart) OR A paper copy in the mail If you have any lab test that is abnormal or we need to change your treatment, we will call you to review the results.   Testing/Procedures: Your physician has requested that you have a aorta/ivc/iliac duplex  Your physician has requested that you have an ankle brachial index (ABI). During this test an ultrasound and blood pressure cuff are used to evaluate the arteries that supply the arms and legs with blood. Allow thirty minutes for this exam. There are no restrictions or special instructions. (To be scheduled in 1 year)   Follow-Up: At [facility], you and your health needs are our priority.  As part of our continuing mission to provide you with exceptional heart care, we have created designated Provider Care Teams.  These Care Teams include your primary Cardiologist (physician) and Advanced Practice Providers (APPs -  Physician Assistants and Nurse Practitioners) who all work together to provide you with the care you need, when you need it.  We recommend signing up for the patient portal called "MyChart".  Sign up information is provided on this After Visit Summary.  MyChart is used to connect with patients for Virtual Visits (Telemedicine).  Patients are able to view lab/test results, encounter notes, upcoming appointments, etc.  Non-urgent messages can be sent to your provider as well.   To learn more about what you can do with MyChart, go to [email or web address].    Your next appointment:    Your physician wants you to follow-up in: 1 year You will receive a reminder letter in the mail two months in advance. If you don't receive a letter, please call our office to schedule the follow-up appointment.   The format for your next appointment:   In Person  Provider:   You may see [name], MD or one of the following Advanced Practice Providers on your designated Care Team:   Nicolasa Ducking, NP Eula Listen, PA-C Cadence Fransico Michael, [place]   Other Instructions N/A  Important Information About Sugar

## 2021-09-06 NOTE — Progress Notes (Signed)
Cardiology Office Note   Date:  09/06/2021   ID:  James, Clayton 02-07-1946, MRN [id number]  PCP:  [name], MD  Cardiologist:   [name], MD   Chief Complaint  Patient presents with   Other    F/u testing aorta/IVC/Iliac no complaints today. Meds reviewed verbally with pt.      History of Present Illness: James Clayton is a 76 y.o. male who presents for a follow-up visit regarding coronary artery disease and peripheral arterial disease. He has chronic medical conditions that include hypertension, hyperlipidemia, type 2 diabetes, stage III chronic kidney disease and obesity. 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 Cardiac catheterization showed severe three-vessel and left main disease. Echo showed normal LV systolic function.   He has known history of refractory hypertension.  Renal artery duplex was suggestive of possible bilateral renal artery stenosis.  Angiography in October 2020 showed moderate bilateral renal artery stenosis estimated to be 60% bilaterally.   The patient is known to have severe bilateral common iliac artery disease noted on angiography in October 2020.  Subsequently, his right common iliac artery was noted to be occluded.  I performed successful covered stent placement to bilateral common iliac arteries with extension into the distal aorta in November 2021.  He has been doing very well with no recent chest pain, shortness of breath or palpitations.  No significant leg claudication.  His wife was recently diagnosed with stage I lung cancer and currently receiving radiation therapy.   Past Medical History:  Diagnosis Date   CAD (coronary artery disease)    a. LHC 02/12/16: ostLM-LM 90% severely calcified, ost-mLAD 90%, ostRamus-Ramus 80%, ostLCx-pLCx 90%, ostRCA-pRCA 100% severely calcified, LVEDP severely elevated; b. 01/2016 CABG x 3 (LIMA->LAD, VG->PDA, VG->RI).     Chronic diastolic CHF (congestive heart failure) (HCC)    a. echo 02/10/16: EF 50-55%, mild inf HK, GR2DD, mild to mod MR, PASP 46 mmHg; b. 04/2018 Echo: EF >55%. Triv AI. Mild TR.   CKD (chronic kidney disease), stage III (HCC)    Diabetes mellitus with complication (HCC)    HLD (hyperlipidemia)    Hypertensive heart disease    Ileus (HCC)    a. 02/2016 following CABG.   Morbid obesity (HCC)    PAD (peripheral artery disease) (HCC)    a. 12/2018 Periph Angio: RCIA 99p, LCIA 80p; b. 01/2020 PV Angio/PTA: RRA 60, RCIA 100, RCFA mild dzs, LCIA 80, LIIA 40ost, LCFA mild dzs-->?s/p covered stenting to bilat CIAs w/ additional stents into distal Ao; c. 08/2020 ABIs: R 1.05, L 1.00. Patent iliac stents.   Post-operative Atrial Fibrillation    a. 01/2016 following CABG.   Renal artery stenosis (HCC)    a. 12/2018 Renal angio: RRA 50-60, LRA 60-->Med Rx.    Past Surgical History:  Procedure Laterality Date   ABDOMINAL AORTOGRAM W/LOWER EXTREMITY N/A 02/02/2020   Procedure: ABDOMINAL AORTOGRAM W/LOWER EXTREMITY;  Surgeon: [name], MD;  Location: [facility];  Service: Cardiovascular;  Laterality: N/A;   CARDIAC CATHETERIZATION N/A 02/12/2016   Procedure: Left Heart Cath and Coronary Angiography;  Surgeon: [name], MD;  Location: [facility] INVASIVE CV LAB;  Service: Cardiovascular;  Laterality: N/A;   COLONOSCOPY WITH PROPOFOL N/A 12/27/2016   Procedure: COLONOSCOPY WITH PROPOFOL;  Surgeon: [name], MD;  Location: [facility] ENDOSCOPY;  Service: Endoscopy;  Laterality: N/A;   CORONARY ARTERY BYPASS GRAFT N/A 02/13/2016  Procedure: CORONARY ARTERY BYPASS GRAFTING (CABG)x 3 with endoscopic harvesting of right saphenous  vein LIMA to LAD SVG to Ramus SVG to PDA;  Surgeon: [name], MD;  Location[facility];  Service: Open Heart Surgery;  Laterality: N/A;   JOINT REPLACEMENT     PERIPHERAL VASCULAR INTERVENTION  02/02/2020   Procedure: PERIPHERAL VASCULAR INTERVENTION;   Surgeon: [name], MD;  Location: [facility];  Service: Cardiovascular;;   RENAL ANGIOGRAPHY N/A 12/23/2018   Procedure: RENAL ANGIOGRAPHY;  Surgeon: [name], MD;  Location: [facility];  Service: Cardiovascular;  Laterality: N/A;   TEE WITHOUT CARDIOVERSION N/A 02/13/2016   Procedure: TRANSESOPHAGEAL ECHOCARDIOGRAM (TEE);  Surgeon: [name], MD;  Location: [facility] OR;  Service: Open Heart Surgery;  Laterality: N/A;     Current Outpatient Medications  Medication Sig Dispense Refill   acetaminophen (TYLENOL) 500 MG tablet Take 500 mg by mouth daily.     allopurinol (ZYLOPRIM) 100 MG tablet Take 100 mg by mouth 2 (two) times daily.   12   amLODipine (NORVASC) 10 MG tablet Take 1 tablet (10 mg total) by mouth daily. 90 tablet 3   ascorbic acid (VITAMIN C) 500 MG tablet Take 500 mg by mouth daily.     aspirin 81 MG EC tablet Take 1 tablet (81 mg total) by mouth daily.     atorvastatin (LIPITOR) 40 MG tablet Take 1 tablet (40 mg total) by mouth daily. 30 tablet 1   carvedilol (COREG) 25 MG tablet Take 1 tablet (25 mg total) 2 (two) times daily with a meal by mouth. 180 tablet 1   clopidogrel (PLAVIX) 75 MG tablet TAKE ONE TABLET DAILY 30 tablet 2   empagliflozin (JARDIANCE) 25 MG TABS tablet Take 25 mg by mouth daily.     gabapentin (NEURONTIN) 300 MG capsule Take 300 mg by mouth 2 (two) times daily.     hydrALAZINE (APRESOLINE) 25 MG tablet Take 25 mg by mouth 2 (two) times daily.      hydrochlorothiazide (HYDRODIURIL) 25 MG tablet Take 25 mg by mouth daily.      losartan (COZAAR) 100 MG tablet Take 100 mg by mouth daily.     Multiple Vitamin (MULTIVITAMIN WITH MINERALS) TABS tablet Take 1 tablet by mouth daily.     omega-3 acid ethyl esters (LOVAZA) 1 g capsule Take 1 capsule (1 g total) by mouth 2 (two) times daily.     Semaglutide, 2 MG/DOSE, (OZEMPIC, 2 MG/DOSE,) 8 MG/3ML SOPN Inject into the skin once a week.     No current facility-administered  medications for this visit.    Allergies:   Gemfibrozil and Penicillin g    Social History:  The patient  reports that he has quit smoking. He has never used smokeless tobacco. He reports that he does not drink alcohol and does not use drugs.   Family History:  The patient's family history includes Cancer in his mother; Heart disease in his father.    ROS:  Please see the history of present illness.   Otherwise, review of systems are positive for none.   All other systems are reviewed and negative.    PHYSICAL EXAM: VS:  BP 130/80 (BP Location: Left Arm, Patient Position: Sitting, Cuff Size: Normal)   Pulse 76   Ht 6\' 1"  (1.854 m)   Wt 212 lb 8 oz (96.4 kg)   SpO2 97%   BMI 28.04 kg/m  , BMI Body mass index is 28.04  kg/m. GEN: Well nourished, well developed, in no acute distress  HEENT: normal  Neck: no JVD, carotid bruits, or masses Cardiac: RRR; no  rubs, or gallops. 2/ 6 systolic ejection murmur at the base. There is trace bilateral leg edema Respiratory:  clear to auscultation bilaterally, normal work of breathing GI: soft, nontender, nondistended, + BS MS: no deformity or atrophy  Skin: warm and dry, no rash Neuro:  Strength and sensation are intact Psych: euthymic mood, full affect Distal pedal pulses are palpable.  EKG:  EKG is ordered today. The ekg ordered today demonstrates normal sinus rhythm with no significant ST or T wave changes.   Recent Labs: No results found for requested labs within last 365 days.    Lipid Panel    Component Value Date/Time   CHOL 105 02/13/2016 0508   TRIG 173 (H) 02/20/2016 0440   HDL 21 (L) 02/13/2016 0508   CHOLHDL 5.0 02/13/2016 0508   VLDL 47 (H) 02/13/2016 0508   LDLCALC 37 02/13/2016 0508      Wt Readings from Last 3 Encounters:  09/06/21 212 lb 8 oz (96.4 kg)  03/22/21 212 lb (96.2 kg)  09/21/20 224 lb (101.6 kg)          03/07/2016    2:39 PM  PAD Screen  Previous PAD dx? No  Previous surgical procedure?  No  Pain with walking? Yes  Subsides with rest? Yes  Feet/toe relief with dangling? No  Painful, non-healing ulcers? No  Extremities discolored? No      ASSESSMENT AND PLAN:  1. Peripheral arterial disease: Status post bilateral common iliac artery stent placement extending into the distal aorta.  He is doing well with no claudication .  I reviewed the results of recent Doppler studies with him which showed normal ABI and patent iliac stents.  Repeat studies in 1 year. Continue long-term dual antiplatelet therapy as tolerated.  2. Coronary artery disease involving native coronary arteries without angina: Chronic atypical musculoskeletal chest pain.  No convincing symptoms of angina.  Continue medical therapy.  3. Essential hypertension: Blood pressure is reasonably controlled on current medications.  4.  Mixed hyperlipidemia: Currently on atorvastatin, gemfibrozil and Lovaza.  I reviewed most recent lipid profile which showed very low LDL although his triglyceride continues to be mildly elevated.  5.  Moderate bilateral renal artery stenosis: No indication for revascularization.    Disposition:   FU with me in 12 months  Signed,  [name], MD  09/06/2021 8:12 AM    [facility]

## 2021-12-17 ENCOUNTER — Encounter: Payer: Self-pay | Admitting: Cardiovascular Disease

## 2021-12-17 MED ORDER — CLOPIDOGREL BISULFATE 75 MG PO TABS: 75.0000 mg | ORAL_TABLET | Freq: Every day | ORAL | 2 refills | Status: AC

## 2021-12-17 MED ORDER — CARVEDILOL 25 MG PO TABS: 25.0000 mg | ORAL_TABLET | Freq: Two times a day (BID) | ORAL | 1 refills | Status: AC

## 2021-12-17 MED ORDER — AMLODIPINE BESYLATE 10 MG PO TABS: 10.0000 mg | ORAL_TABLET | Freq: Every day | ORAL | 1 refills | Status: AC

## 2022-09-09 ENCOUNTER — Other Ambulatory Visit: Payer: Self-pay | Admitting: Cardiovascular Disease

## 2022-09-09 DIAGNOSIS — I739 Peripheral vascular disease, unspecified: Secondary | ICD-10-CM

## 2022-09-10 ENCOUNTER — Ambulatory Visit (INDEPENDENT_AMBULATORY_CARE_PROVIDER_SITE_OTHER): Payer: Medicare Other

## 2022-09-10 ENCOUNTER — Ambulatory Visit: Payer: Medicare Other

## 2022-09-10 DIAGNOSIS — Z95828 Presence of other vascular implants and grafts: Secondary | ICD-10-CM

## 2022-09-10 DIAGNOSIS — I739 Peripheral vascular disease, unspecified: Secondary | ICD-10-CM

## 2022-09-12 LAB — VAS US ABI WITH/WO TBI
Left ABI: 1.2
Right ABI: 1.14

## 2022-09-16 ENCOUNTER — Other Ambulatory Visit: Payer: Self-pay | Admitting: *Deleted

## 2022-09-16 DIAGNOSIS — I739 Peripheral vascular disease, unspecified: Secondary | ICD-10-CM

## 2023-04-09 ENCOUNTER — Ambulatory Visit: Payer: Medicare Other | Admitting: Nurse Practitioner

## 2023-04-11 ENCOUNTER — Ambulatory Visit: Payer: Medicare Other | Attending: Nurse Practitioner | Admitting: Nurse Practitioner

## 2023-04-11 ENCOUNTER — Encounter: Payer: Self-pay | Admitting: Nurse Practitioner

## 2023-04-11 VITALS — BP 142/80 | HR 80 | Ht 73.0 in | Wt 210.6 lb

## 2023-04-11 DIAGNOSIS — I10 Essential (primary) hypertension: Secondary | ICD-10-CM | POA: Diagnosis not present

## 2023-04-11 DIAGNOSIS — E782 Mixed hyperlipidemia: Secondary | ICD-10-CM

## 2023-04-11 DIAGNOSIS — N183 Chronic kidney disease, stage 3 unspecified: Secondary | ICD-10-CM

## 2023-04-11 DIAGNOSIS — I2510 Atherosclerotic heart disease of native coronary artery without angina pectoris: Secondary | ICD-10-CM | POA: Diagnosis not present

## 2023-04-11 DIAGNOSIS — E118 Type 2 diabetes mellitus with unspecified complications: Secondary | ICD-10-CM

## 2023-04-11 DIAGNOSIS — I739 Peripheral vascular disease, unspecified: Secondary | ICD-10-CM

## 2023-04-11 DIAGNOSIS — I5032 Chronic diastolic (congestive) heart failure: Secondary | ICD-10-CM

## 2023-04-11 MED ORDER — HYDRALAZINE HCL 25 MG PO TABS: 25.0000 mg | ORAL_TABLET | Freq: Three times a day (TID) | ORAL | 1 refills | Status: AC

## 2023-04-11 NOTE — Progress Notes (Signed)
Office Visit    Patient Name: James Clayton Date of Encounter: 04/11/2023  Primary Care Provider:  [name], MD Primary Cardiologist:  [name], MD  Chief Complaint    78 y.o. male  with a history of CAD status post CABG times 19 January 2016 with postoperative atrial fibrillation, HFpEF, hypertension, hyperlipidemia, peripheral arterial disease status post lower extremity interventions, nonobstructive renal artery stenosis, diabetes, stage III-IV chronic kidney disease, and obesity, presents for follow-up of CAD.  Past Medical History  Subjective   Past Medical History:  Diagnosis Date   CAD (coronary artery disease)    a. LHC 02/12/16: ostLM-LM 90% severely calcified, ost-mLAD 90%, ostRamus-Ramus 80%, ostLCx-pLCx 90%, ostRCA-pRCA 100% severely calcified, LVEDP severely elevated; b. 01/2016 CABG x 3 (LIMA->LAD, VG->PDA, VG->RI).    Chronic diastolic CHF (congestive heart failure) (HCC)    a. echo 02/10/16: EF 50-55%, mild inf HK, GR2DD, mild to mod MR, PASP 46 mmHg; b. 04/2018 Echo: EF >55%. Triv AI. Mild TR; c. 02/2022 Echo: Nl LV fxn. Triv AI. Mild TR.   CKD (chronic kidney disease), stage III (HCC)    Diabetes mellitus with complication (HCC)    HLD (hyperlipidemia)    Hypertensive heart disease    Ileus (HCC)    a. 02/2016 following CABG.   Morbid obesity (HCC)    PAD (peripheral artery disease) (HCC)    a. 12/2018 Periph Angio: RCIA 99p, LCIA 80p; b. 01/2020 PV Angio/PTA: RRA 60, RCIA 100, RCFA mild dzs, LCIA 80, LIIA 40ost, LCFA mild dzs-->?s/p covered stenting to bilat CIAs w/ additional stents into distal Ao; c. 08/2022 ABIs: Nl bilat.   Post-operative Atrial Fibrillation    a. 01/2016 following CABG.   Renal artery stenosis (HCC)    a. 12/2018 Renal angio: RRA 50-60, LRA 60-->Med Rx.   Systolic murmur    Past Surgical History:  Procedure Laterality Date   ABDOMINAL AORTOGRAM W/LOWER EXTREMITY N/A 02/02/2020   Procedure: ABDOMINAL AORTOGRAM  W/LOWER EXTREMITY;  Surgeon: [name], MD;  Location: [facility];  Service: Cardiovascular;  Laterality: N/A;   CARDIAC CATHETERIZATION N/A 02/12/2016   Procedure: Left Heart Cath and Coronary Angiography;  Surgeon: [name], MD;  Location: [facility] INVASIVE CV LAB;  Service: Cardiovascular;  Laterality: N/A;   COLONOSCOPY WITH PROPOFOL N/A 12/27/2016   Procedure: COLONOSCOPY WITH PROPOFOL;  Surgeon: [name], MD;  Location: [facility] ENDOSCOPY;  Service: Endoscopy;  Laterality: N/A;   CORONARY ARTERY BYPASS GRAFT N/A 02/13/2016   Procedure: CORONARY ARTERY BYPASS GRAFTING (CABG)x 3 with endoscopic harvesting of right saphenous  vein LIMA to LAD SVG to Ramus SVG to PDA;  Surgeon: [name], MD;  Location: [facility] OR;  Service: Open Heart Surgery;  Laterality: N/A;   JOINT REPLACEMENT     PERIPHERAL VASCULAR INTERVENTION  02/02/2020   Procedure: PERIPHERAL VASCULAR INTERVENTION;  Surgeon: [name], MD;  Location: [facility];  Service: Cardiovascular;;   RENAL ANGIOGRAPHY N/A 12/23/2018   Procedure: RENAL ANGIOGRAPHY;  Surgeon: [name], MD;  Location: [facility];  Service: Cardiovascular;  Laterality: N/A;   TEE WITHOUT CARDIOVERSION N/A 02/13/2016   Procedure: TRANSESOPHAGEAL ECHOCARDIOGRAM (TEE);  Surgeon: [name], MD;  Location: [facility] OR;  Service: Open Heart Surgery;  Laterality: N/A;    Allergies  Allergies  Allergen Reactions   Gemfibrozil Other (See Comments)    "felt like a drunk"   Penicillin G     Other reaction(s):  Other (See Comments)  Passed out at age of 74  Other Reaction(s): Syncope      History of Present Illness      78 y.o. y/o male with the above complex past medical history including CAD status post non-STEMI and CABG x3 in November 2017, postoperative atrial fibrillation, HFpEF, hypertension, hyperlipidemia, peripheral arterial disease, nonobstructive renal artery stenosis, diabetes, stage III-IV  chronic kidney disease, and obesity.  In the setting of refractory hypertension, he underwent renal artery angiography in October 2020 which showed moderate renal artery stenosis estimated to be approximately 60% bilaterally.  During angiography, he was also noted to have bilateral common iliac stenoses-right greater than left.  Noninvasive vascular testing showed an ABI of 0.69 on the right and was normal on the left.  In the setting of lifestyle limiting right lower extremity claudication and poorly healing ulceration on the right foot, he underwent peripheral angiography in November 2021 showing an occluded right common iliac artery, and 80% stenosis in the left common iliac artery, and mild disease in the common femoral arteries.  He underwent covered stenting of the bilateral common iliac arteries with additional stents in the distal aorta.  Lifelong dual antiplatelet therapy was recommended.  Post procedure ABI/aortoiliac duplex showed patent stents and normal ABIs.  Following intervention, he had complete healing of his right foot ulceration.     James Clayton was last seen in cardiology clinic in June 2023 at which time he was doing well.  ABIs at that time were normal.  Since his last visit, he has continued to do will.  Though it has been cold recently, he has not been able to play golf as much as usual but prior to the cold snap, he was playing at least once a week.  He denies chest pain, dyspnea, palpitations, PND, orthopnea, dizziness, syncope, edema, or early satiety.   Objective  Home Medications    Current Outpatient Medications  Medication Sig Dispense Refill   acetaminophen (TYLENOL) 500 MG tablet Take 500 mg by mouth daily.     allopurinol (ZYLOPRIM) 100 MG tablet Take 100 mg by mouth 2 (two) times daily.   12   amLODipine (NORVASC) 10 MG tablet Take 1 tablet (10 mg total) by mouth daily. 90 tablet 1   ascorbic acid (VITAMIN C) 500 MG tablet Take 500 mg by mouth daily.     aspirin 81  MG EC tablet Take 1 tablet (81 mg total) by mouth daily.     atorvastatin (LIPITOR) 40 MG tablet Take 1 tablet (40 mg total) by mouth daily. 30 tablet 1   carvedilol (COREG) 25 MG tablet Take 1 tablet (25 mg total) by mouth 2 (two) times daily with a meal. 180 tablet 1   clopidogrel (PLAVIX) 75 MG tablet Take 1 tablet (75 mg total) by mouth daily. 30 tablet 2   empagliflozin (JARDIANCE) 25 MG TABS tablet Take 25 mg by mouth daily.     gabapentin (NEURONTIN) 300 MG capsule Take 300 mg by mouth 2 (two) times daily.     hydrochlorothiazide (HYDRODIURIL) 25 MG tablet Take 25 mg by mouth daily.      losartan (COZAAR) 100 MG tablet Take 100 mg by mouth daily.     Multiple Vitamin (MULTIVITAMIN WITH MINERALS) TABS tablet Take 1 tablet by mouth daily.     omega-3 acid ethyl esters (LOVAZA) 1 g capsule Take 1 capsule (1 g total) by mouth 2 (two) times daily.     Semaglutide, 2 MG/DOSE, (OZEMPIC,  2 MG/DOSE,) 8 MG/3ML SOPN Inject into the skin once a week.     hydrALAZINE (APRESOLINE) 25 MG tablet Take 1 tablet (25 mg total) by mouth 3 (three) times daily. 270 tablet 1   No current facility-administered medications for this visit.     Physical Exam    VS:  BP (!) 142/80   Pulse 80   Ht 6\' 1"  (1.854 m)   Wt 210 lb 9.6 oz (95.5 kg)   SpO2 98%   BMI 27.79 kg/m  , BMI Body mass index is 27.79 kg/m.     Vitals:   04/11/23 1041 04/11/23 1106  BP: (!) 158/86 (!) 142/80  Pulse: 80   SpO2: 98%       GEN: Well nourished, well developed, in no acute distress. HEENT: normal. Neck: Supple, no JVD, carotid bruits, or masses. Cardiac: RRR, 2/6 SEM throughout, loudest @ the upper sternal borders.  No rubs or gallops. No clubbing, cyanosis, edema.  Radials 2+/PT 1+ and equal bilaterally.  Respiratory:  Respirations regular and unlabored, clear to auscultation bilaterally. GI: Soft, nontender, nondistended, BS + x 4. MS: no deformity or atrophy. Skin: warm and dry, no rash. Neuro:  Strength and  sensation are intact. Psych: Normal affect.  Accessory Clinical Findings    ECG personally reviewed by me today - EKG Interpretation Date/Time:  Friday April 11 2023 10:46:28 EST Ventricular Rate:  80 PR Interval:  172 QRS Duration:  82 QT Interval:  348 QTC Calculation: 401 R Axis:   39  Text Interpretation: Normal sinus rhythm Normal ECG Confirmed by Nicolasa Ducking [phone]) on 04/11/2023 10:56:24 AM  - no acute changes.  Labs from care everywhere dated January 15, 2023:  Hemoglobin A1c 6.7 TSH 0.201, free T4 1.08, free T3 3.0. Total bilirubin 6.1, alkaline phosphatase 87, AST 13, ALT 13 Total cholesterol 84, triglycerides 217, HDL 25.4, LDL 15  Labs from Care Everywhere dated March 24, 2023  Sodium 144, potassium 4.0, chloride 105, CO2 28, BUN 37, creatinine 2.27, glucose 168 Hemoglobin 14.5, hematocrit 43.9, WBC 10.2, platelets 220     Assessment & Plan    1.  CAD:  s/p CABG x 3 in 2017.  He continues to do well without chest pain or dyspnea and remains reasonably active though notes that he is not walking as much as he would like to, secondary to cold weather.  Continue aspirin, statin, beta-blocker, Plavix, and ARB.  2.  Peripheral arterial disease: Status post bilateral common iliac and distal aortic stenting in late 2021 with stable ABIs in June 2024.  Denies claudication.  Follow-up ABIs in June 2025.  3.  Primary hypertension: Blood pressure elevated today on 2 consecutive recordings.  He notes these are similar to what he experiences at home.  Increasing hydralazine to 25 mg 3 times daily (currently twice daily).  Continue amlodipine, carvedilol, HCTZ, and losartan.  4.  Hyperlipidemia:  LDL 15 in 12/2022 w/ nl LFTs.  Cont statin and lovaza.  5.  Type 2 diabetes mellitus: A1c was 6.7 in October 2024.  Is managed by primary care and remains on Ozempic and empagliflozin.  6.  Chronic HFpEF: Euvolemic.  Adjusting hydralazine to 3 times daily in the setting of  hypertension.  7.  Stage III chronic kidney disease: Followed closely by nephrology.  Creatinine 2.27 earlier this month, which is stable.  He remains on ARB.  8.  Disposition: Follow-up ABIs and in office in 6 months.  Nicolasa Ducking, NP 04/11/2023, 11:49 AM

## 2023-04-11 NOTE — Patient Instructions (Addendum)
Medication Instructions:  INCREASE Hydralazine to 25 mg three times daily  *If you need a refill on your cardiac medications before your next appointment, please call your pharmacy*   Lab Work: None ordered If you have labs (blood work) drawn today and your tests are completely normal, you will receive your results only by: MyChart Message (if you have MyChart) OR A paper copy in the mail If you have any lab test that is abnormal or we need to change your treatment, we will call you to review the results.   Testing/Procedures: Your physician has recommended that you have an AORTA/ILIAC Duplex in July. This is a noninvasive diagnostic test that uses ultrasound technology to look at the aorta and iliac arteries to detect any restriction to blood flow to the buttocks, groin, legs or feet.  No food after 11PM the night before.  Water is OK. (Don't drink liquids if you have been instructed not to for ANOTHER test). Avoid foods that produce bowel gas, for 24 hours prior to exam (see below). No breakfast, no chewing gum, no smoking or carbonated beverages. Patient may take morning medications with water. Come in for test at least 15 minutes early to register. This will take approximately 60 minutes This will take place at [address] [facility]) [address], [place] [address]  Please note: We ask at that you not bring children with you during ultrasound (echo/ vascular) testing. Due to room size and safety concerns, children are not allowed in the ultrasound rooms during exams. Our front office staff cannot provide observation of children in our lobby area while testing is being conducted. An adult accompanying a patient to their appointment will only be allowed in the ultrasound room at the discretion of the ultrasound technician under special circumstances. We apologize for any inconvenience.  Your physician has requested that you have an ankle brachial index (ABI) in July. During  this test an ultrasound and blood pressure cuff are used to evaluate the arteries that supply the arms and legs with blood.  Allow thirty minutes for this exam.  There are no restrictions or special instructions.  This will take place at [address] [place] [address], [place] [address]  Follow-Up: At [facility], you and your health needs are our priority.  As part of our continuing mission to provide you with exceptional heart care, we have created designated Provider Care Teams.  These Care Teams include your primary Cardiologist (physician) and Advanced Practice Providers (APPs -  Physician Assistants and Nurse Practitioners) who all work together to provide you with the care you need, when you need it.  We recommend signing up for the patient portal called "MyChart".  Sign up information is provided on this After Visit Summary.  MyChart is used to connect with patients for Virtual Visits (Telemedicine).  Patients are able to view lab/test results, encounter notes, upcoming appointments, etc.  Non-urgent messages can be sent to your provider as well.   To learn more about what you can do with MyChart, go to [email or web address].    Your next appointment:   6 month(s)  Provider:   [name], MD

## 2023-07-31 ENCOUNTER — Encounter: Payer: Self-pay | Admitting: Cardiovascular Disease

## 2023-09-10 ENCOUNTER — Ambulatory Visit (INDEPENDENT_AMBULATORY_CARE_PROVIDER_SITE_OTHER)

## 2023-09-10 ENCOUNTER — Ambulatory Visit: Attending: Cardiovascular Disease

## 2023-09-10 DIAGNOSIS — Z95828 Presence of other vascular implants and grafts: Secondary | ICD-10-CM | POA: Diagnosis not present

## 2023-09-10 DIAGNOSIS — I739 Peripheral vascular disease, unspecified: Secondary | ICD-10-CM

## 2023-09-10 LAB — VAS US ABI WITH/WO TBI
Left ABI: 1.05
Right ABI: 1.02

## 2023-09-11 ENCOUNTER — Ambulatory Visit: Payer: Self-pay | Admitting: Cardiovascular Disease

## 2023-09-11 DIAGNOSIS — I739 Peripheral vascular disease, unspecified: Secondary | ICD-10-CM

## 2023-09-23 ENCOUNTER — Encounter (HOSPITAL_COMMUNITY): Payer: Medicare Other

## 2023-10-23 ENCOUNTER — Encounter: Payer: Self-pay | Admitting: Nurse Practitioner

## 2023-10-23 ENCOUNTER — Ambulatory Visit: Attending: Nurse Practitioner | Admitting: Nurse Practitioner

## 2023-10-23 VITALS — BP 132/72 | HR 84 | Ht 73.0 in | Wt 206.8 lb

## 2023-10-23 DIAGNOSIS — I739 Peripheral vascular disease, unspecified: Secondary | ICD-10-CM | POA: Diagnosis not present

## 2023-10-23 DIAGNOSIS — E782 Mixed hyperlipidemia: Secondary | ICD-10-CM

## 2023-10-23 DIAGNOSIS — E118 Type 2 diabetes mellitus with unspecified complications: Secondary | ICD-10-CM

## 2023-10-23 DIAGNOSIS — I10 Essential (primary) hypertension: Secondary | ICD-10-CM | POA: Diagnosis not present

## 2023-10-23 DIAGNOSIS — I5032 Chronic diastolic (congestive) heart failure: Secondary | ICD-10-CM

## 2023-10-23 DIAGNOSIS — I2510 Atherosclerotic heart disease of native coronary artery without angina pectoris: Secondary | ICD-10-CM

## 2023-10-23 DIAGNOSIS — N183 Chronic kidney disease, stage 3 unspecified: Secondary | ICD-10-CM

## 2023-10-23 NOTE — Progress Notes (Signed)
 Office Visit    Patient Name: James Clayton Date of Encounter: 10/23/2023  Primary Care Provider:  Auston Reyes BIRCH, MD Primary Cardiologist:  Deatrice Cage, MD    Chief Complaint    78 y.o. male with a history of CAD status post CABG times 3 in November 2017 with postoperative atrial fibrillation, HFpEF, hypertension, hyperlipidemia, peripheral arterial disease status post lower extremity interventions, nonobstructive renal artery stenosis, diabetes, stage III-IV chronic kidney disease, and obesity, presents for follow-up of CAD.   Past Medical History   Subjective   Past Medical History:  Diagnosis Date   CAD (coronary artery disease)    a. LHC 02/12/16: ostLM-LM 90% severely calcified, ost-mLAD 90%, ostRamus-Ramus 80%, ostLCx-pLCx 90%, ostRCA-pRCA 100% severely calcified, LVEDP severely elevated; b. 01/2016 CABG x 3 (LIMA->LAD, VG->PDA, VG->RI).    Chronic diastolic CHF (congestive heart failure) (HCC)    a. echo 02/10/16: EF 50-55%, mild inf HK, GR2DD, mild to mod MR, PASP 46 mmHg; b. 04/2018 Echo: EF >55%. Triv AI. Mild TR; c. 02/2022 Echo: Nl LV fxn. Triv AI. Mild TR.   CKD (chronic kidney disease), stage III (HCC)    Diabetes mellitus with complication (HCC)    HLD (hyperlipidemia)    Hypertensive heart disease    Ileus (HCC)    a. 02/2016 following CABG.   Morbid obesity (HCC)    PAD (peripheral artery disease) (HCC)    a. 12/2018 Periph Angio: RCIA 99p, LCIA 80p; b. 01/2020 PV Angio/PTA: RRA 60, RCIA 100, RCFA mild dzs, LCIA 80, LIIA 40ost, LCFA mild dzs-->?s/p covered stenting to bilat CIAs w/ additional stents into distal Ao; c. 08/2023 ABIs: R 1.02, L 1.05. Patent bilat iliac stents on duplex.   Post-operative Atrial Fibrillation    a. 01/2016 following CABG.   Renal artery stenosis (HCC)    a. 12/2018 Renal angio: RRA 50-60, LRA 60-->Med Rx.   Systolic murmur    Past Surgical History:  Procedure Laterality Date   ABDOMINAL AORTOGRAM W/LOWER EXTREMITY N/A  02/02/2020   Procedure: ABDOMINAL AORTOGRAM W/LOWER EXTREMITY;  Surgeon: Cage Deatrice LABOR, MD;  Location: [facility];  Service: Cardiovascular;  Laterality: N/A;   CARDIAC CATHETERIZATION N/A 02/12/2016   Procedure: Left Heart Cath and Coronary Angiography;  Surgeon: Deatrice LABOR Cage, MD;  Location: [facility] INVASIVE CV LAB;  Service: Cardiovascular;  Laterality: N/A;   COLONOSCOPY WITH PROPOFOL  N/A 12/27/2016   Procedure: COLONOSCOPY WITH PROPOFOL ;  Surgeon: Viktoria Lamar DASEN, MD;  Location: [facility] ENDOSCOPY;  Service: Endoscopy;  Laterality: N/A;   CORONARY ARTERY BYPASS GRAFT N/A 02/13/2016   Procedure: CORONARY ARTERY BYPASS GRAFTING (CABG)x 3 with endoscopic harvesting of right saphenous  vein LIMA to LAD SVG to Ramus SVG to PDA;  Surgeon: Maude Fleeta Ochoa, MD;  Location: [facility] OR;  Service: Open Heart Surgery;  Laterality: N/A;   JOINT REPLACEMENT     PERIPHERAL VASCULAR INTERVENTION  02/02/2020   Procedure: PERIPHERAL VASCULAR INTERVENTION;  Surgeon: Cage Deatrice LABOR, MD;  Location: [facility];  Service: Cardiovascular;;   RENAL ANGIOGRAPHY N/A 12/23/2018   Procedure: RENAL ANGIOGRAPHY;  Surgeon: Cage Deatrice LABOR, MD;  Location: [facility];  Service: Cardiovascular;  Laterality: N/A;   TEE WITHOUT CARDIOVERSION N/A 02/13/2016   Procedure: TRANSESOPHAGEAL ECHOCARDIOGRAM (TEE);  Surgeon: Maude Fleeta Ochoa, MD;  Location: [facility] OR;  Service: Open Heart Surgery;  Laterality: N/A;    Allergies  Allergies  Allergen Reactions   Penicillin G Anaphylaxis    Other reaction(s): Other (  See Comments)  Passed out at age of 26  Other Reaction(s): Syncope   Gemfibrozil  Other (See Comments)    felt like a drunk       History of Present Illness      78 y.o. y/o male with the above complex past medical history including CAD status post non-STEMI and CABG x3 in November 2017, postoperative atrial fibrillation, HFpEF, hypertension, hyperlipidemia, peripheral arterial disease,  nonobstructive renal artery stenosis, diabetes, stage III-IV chronic kidney disease, and obesity.  In the setting of refractory hypertension, he underwent renal artery angiography in October 2020 which showed moderate renal artery stenosis estimated to be approximately 60% bilaterally.  During angiography, he was also noted to have bilateral common iliac stenoses-right greater than left.  Noninvasive vascular testing showed an ABI of 0.69 on the right and was normal on the left.  In the setting of lifestyle limiting right lower extremity claudication and poorly healing ulceration on the right foot, he underwent peripheral angiography in November 2021 showing an occluded right common iliac artery, and 80% stenosis in the left common iliac artery, and mild disease in the common femoral arteries.  He underwent covered stenting of the bilateral common iliac arteries with additional stents in the distal aorta.  Lifelong dual antiplatelet therapy was recommended.  Post procedure ABI/aortoiliac duplex showed patent stents and normal ABIs.  Following intervention, he had complete healing of his right foot ulceration.      [name] was last seen in cardiology clinic in January 2025, at which time he was doing well without chest pain or claudication.  He recently underwent a repeat ABIs showing patent iliac stents with borderline restenosis on the right side with plan for repeat study in 1 year.  Since his last visit, he has had some emotional stress, losing his stepson to multiple myeloma.  This has been very hard on the family.  From a cardiac standpoint he has done well.  He has not been able to play as much golf as he would like due to alternating extreme heat and rainy weather but is able to walk and complete routine activities without experiencing chest pain or dyspnea.  Further, he denies any claudication, palpitations, PND, orthopnea, syncope, edema, or early satiety.  He did have an episode of lightheadedness  a few weeks ago during a period of extreme heat while he was working outside.  Heart rate and blood pressure were stable on home monitoring and he said no recurrence.  He thinks he was just a little bit dehydrated. Objective   Home Medications    Current Outpatient Medications  Medication Sig Dispense Refill   acetaminophen  (TYLENOL ) 500 MG tablet Take 500 mg by mouth daily.     allopurinol  (ZYLOPRIM ) 100 MG tablet Take 100 mg by mouth 2 (two) times daily.   12   amLODipine  (NORVASC ) 10 MG tablet Take 1 tablet (10 mg total) by mouth daily. 90 tablet 1   ascorbic acid  (VITAMIN C) 500 MG tablet Take 500 mg by mouth daily.     aspirin  81 MG EC tablet Take 1 tablet (81 mg total) by mouth daily.     atorvastatin  (LIPITOR) 40 MG tablet Take 1 tablet (40 mg total) by mouth daily. 30 tablet 1   carvedilol  (COREG ) 25 MG tablet Take 1 tablet (25 mg total) by mouth 2 (two) times daily with a meal. 180 tablet 1   clopidogrel  (PLAVIX ) 75 MG tablet Take 1 tablet (75 mg total) by mouth daily. 30  tablet 2   empagliflozin (JARDIANCE) 25 MG TABS tablet Take 25 mg by mouth daily.     gabapentin  (NEURONTIN ) 300 MG capsule Take 300 mg by mouth 2 (two) times daily.     hydrALAZINE  (APRESOLINE ) 25 MG tablet Take 1 tablet (25 mg total) by mouth 3 (three) times daily. 270 tablet 1   hydrochlorothiazide  (HYDRODIURIL ) 25 MG tablet Take 25 mg by mouth daily.      losartan  (COZAAR ) 100 MG tablet Take 100 mg by mouth daily.     methimazole (TAPAZOLE) 5 MG tablet Take 2.5 mg by mouth daily.     Multiple Vitamin (MULTIVITAMIN WITH MINERALS) TABS tablet Take 1 tablet by mouth daily.     omega-3 acid ethyl esters (LOVAZA ) 1 g capsule Take 1 capsule (1 g total) by mouth 2 (two) times daily.     Semaglutide , 2 MG/DOSE, (OZEMPIC , 2 MG/DOSE,) 8 MG/3ML SOPN Inject into the skin once a week.     No current facility-administered medications for this visit.     Physical Exam    VS:  BP 132/72   Pulse 84   Ht 6' 1 (1.854 m)    Wt 206 lb 12.8 oz (93.8 kg)   SpO2 98%   BMI 27.28 kg/m  , BMI Body mass index is 27.28 kg/m.          GEN: Well nourished, well developed, in no acute distress. HEENT: normal. Neck: Supple, no JVD, carotid bruits, or masses. Cardiac: RRR, 2/6 systolic murmur at the upper sternal borders.  No rubs or gallops. No clubbing, cyanosis, edema.  Radials 2+/PT 1+ and equal bilaterally.  Respiratory:  Respirations regular and unlabored, clear to auscultation bilaterally. GI: Soft, nontender, nondistended, BS + x 4. MS: no deformity or atrophy. Skin: warm and dry, no rash. Neuro:  Strength and sensation are intact. Psych: Normal affect.  Accessory Clinical Findings    ECG personally reviewed by me today - EKG Interpretation Date/Time:  Thursday October 23 2023 08:31:01 EDT Ventricular Rate:  84 PR Interval:  170 QRS Duration:  84 QT Interval:  342 QTC Calculation: 404 R Axis:   17  Text Interpretation: Normal sinus rhythm Normal ECG Nonspecific ST and T wave abnormality Confirmed by Vivienne Bruckner [phone]) on 10/23/2023 8:38:14 AM  - no acute changes.  Labs dated 07/01/2023 from Care Everywhere:  Hemoglobin 15.4, hematocrit 45.4, WBC 9, platelets 175 Sodium 143, potassium 4.4, chloride 107, CO2 26.9, BUN 38, creatinine 2.1, glucose 139 Calcium  9.4, albumin  4.4, total protein 6.4 Total bilirubin 0.7, alkaline phosphatase 90, AST 14, ALT 17 Total cholesterol 96, triglycerides 187, HDL 25.8, LDL 33 TSH 0.315 Hemoglobin A1c 6.8    Assessment & Plan    1.  CAD: Status post CABG x 3 in 2017.  He continues to do well without chest pain or dyspnea and remains reasonably active though he has not been able to play as much golf as he would like due to alternating extreme heat and rainy weather.  He remains on aspirin , statin, beta-blocker, Plavix , and ARB.  2.  Peripheral arterial disease: Status post bilateral common iliac and distal aortic stenting in late 2021 with stable ABIs in June  2025.  Denies claudication.  Remains on lifelong dual antiplatelet therapy as well as statin.  Plan for follow-up ABIs in 1 year.  3.  Primary hypertension: Blood pressure improved since increasing hydralazine  at last visit.  Pressure 132/72 today.  He remains on amlodipine , carvedilol , HCTZ, losartan , and hydralazine .  4.  Hyperlipidemia/hypertriglyceridemia: LDL of 33 in April 2025 with normal LFTs.  Triglycerides 187.  He remains on statin and Lovaza  therapy.  5.  Type 2 diabetes mellitus: A1c 6.8 in April.  He is on Ozempic  and empagliflozin and managed by primary care.  6.  Chronic HFpEF: Euvolemic with stable heart rate and blood pressure.  Continue current regimen.  7.  Stage III chronic kidney disease: Creatinine stable at 2.1 by lab work in April.  He remains on ARB and is followed by nephrology.  8.  Hyperthyroidism: TSH 0.315 in April.  He is on methimazole   9.  Disposition: Follow-up in 6 months or sooner if necessary.  Lonni Meager, NP 10/23/2023, 9:03 AM

## 2023-10-23 NOTE — Patient Instructions (Signed)
 Medication Instructions:  Your physician recommends that you continue on your current medications as directed. Please refer to the Current Medication list given to you today.   *If you need a refill on your cardiac medications before your next appointment, please call your pharmacy*  Lab Work: None ordered at this time  If you have labs (blood work) drawn today and your tests are completely normal, you will receive your results only by: MyChart Message (if you have MyChart) OR A paper copy in the mail If you have any lab test that is abnormal or we need to change your treatment, we will call you to review the results.  Testing/Procedures: None ordered at this time   Follow-Up: At [facility], you and your health needs are our priority.  As part of our continuing mission to provide you with exceptional heart care, our providers are all part of one team.  This team includes your primary Cardiologist (physician) and Advanced Practice Providers or APPs (Physician Assistants and Nurse Practitioners) who all work together to provide you with the care you need, when you need it.  Your next appointment:   6 month(s)  Provider:   You may see Deatrice Cage, MD

## 2024-03-02 NOTE — Progress Notes (Signed)
 James Clayton is a  78 y.o. male who presents for  CHIEF COMPLAINT Chief Complaint  Patient presents with   Annual Exam    Subjective: History of Present Illness  Pt in NAD. Here for yearly eval. HTN stable on meds. Has thyroid dz on Tapazole, HLD on statin and DM not on insulin . Also with CKD. Weight stable. Some back pain using Tylenol . Sleeping OK. No fever or HA's. Some CP but no SOB or palpitations. No change in bowels or bladder.    Past Medical History:  Diagnosis Date   Allergy 09/30/1958   Arthritis    CAD (coronary artery disease)    Colon cancer screening 09/08/2016   Diabetes mellitus type 2, uncomplicated (CMS/HHS-HCC)    Gout, joint 2009   Heart disease 02/09/2016   Hematologic abnormality 01/26/2020   History of colonic polyps 09/08/2016   History of myocardial infarction 02/09/2016   Hyperlipidemia    Hypertension    Kidney cysts    Kidney stones    Osteoarthritis 2009   Stroke (cerebrum) (CMS/HHS-HCC)    Patient Active Problem List  Diagnosis   ASCVD (arteriosclerotic cardiovascular disease)   Coronary atherosclerosis of autologous vein bypass graft without angina   CKD (chronic kidney disease) stage 3, GFR 30-59 ml/min (CMS-HCC)   HTN, goal below 140/80   Hyperlipidemia   S/P CABG x 3   History of colonic polyps   Diabetes mellitus with complication (CMS/HHS-HCC)   PVD (peripheral vascular disease)   Edema of lower extremity   Peripheral edema   Hemospermia   History of tobacco use   History of tobacco use   Non-ST elevation (NSTEMI) myocardial infarction (CMS/HHS-HCC)   Osteoarthritis of knee   Pain in toe   Pes planus   Pure hypercholesterolemia   Secondary hyperparathyroidism of renal origin (HHS-HCC)   Chronic gouty arthritis   Hyperthyroidism without crisis    Past Surgical History:  Procedure Laterality Date   COLONOSCOPY  11/11/2008   Hyperplastic Polyps   right total knee replacement   12/03/2011   JOINT REPLACEMENT  11/2012   12/2013   COLONOSCOPY  12/27/2016   Int Hemorrhoids, Diverticulosis: CBF 12/2026   CARDIAC SURGERY  02/13/2016   CORONARY ARTERY BYPASS GRAFT  02/13/2016   KNEE ARTHROSCOPY     triple bypass       Current Outpatient Medications:    acetaminophen  (TYLENOL ) 500 MG tablet, Take 500 mg by mouth once daily, Disp: , Rfl:    allopurinoL  (ZYLOPRIM ) 100 MG tablet, Take 1 tablet (100 mg total) by mouth once daily, Disp: 90 tablet, Rfl: 3   amLODIPine  (NORVASC ) 10 MG tablet, Take 1 tablet (10 mg total) by mouth once daily, Disp: 90 tablet, Rfl: 3   aspirin  81 MG EC tablet, Take 1 tablet (81 mg total) by mouth once daily, Disp: , Rfl:    atorvastatin  (LIPITOR) 40 MG tablet, Take 1 tablet (40 mg total) by mouth once daily, Disp: 90 tablet, Rfl: 3   carvediloL  (COREG ) 25 MG tablet, Take 1 tablet (25 mg total) by mouth 2 (two) times daily with meals, Disp: 180 tablet, Rfl: 1   clopidogreL  (PLAVIX ) 75 mg tablet, Take 1 tablet (75 mg total) by mouth once daily, Disp: 90 tablet, Rfl: 3   empagliflozin (JARDIANCE) 25 mg tablet, Take 1 tablet (25 mg total) by mouth once daily, Disp: 90 tablet, Rfl: 3   gabapentin  (NEURONTIN ) 300 MG capsule, Take 1 capsule (300 mg total) by mouth 2 (two) times daily,  Disp: 180 capsule, Rfl: 1   hydroCHLOROthiazide  (HYDRODIURIL ) 25 MG tablet, Take 1 tablet (25 mg total) by mouth once daily, Disp: 90 tablet, Rfl: 3   losartan  (COZAAR ) 100 MG tablet, Take 1 tablet (100 mg total) by mouth once daily, Disp: 90 tablet, Rfl: 3   methIMAzole (TAPAZOLE) 5 MG tablet, Take 0.5 tablets (2.5 mg total) by mouth once daily, Disp: 45 tablet, Rfl: 3   multivitamin tablet, Take 1 tablet by mouth once daily, Disp: , Rfl:    omega-3 acid ethyl esters (LOVAZA ) 1 gram capsule, , Disp: , Rfl:    semaglutide  (OZEMPIC ) 2 mg/dose (8 mg/3 mL) pen injector, Inject subcutaneously, Disp: , Rfl:    hydrALAZINE  (APRESOLINE ) 50 MG tablet, Take 1  tablet (50 mg total) by mouth 2 (two) times daily, Disp: 180 tablet, Rfl: 3  Lopid  [gemfibrozil ], Penicillin, and Penicillin g procaine  Social History   Socioeconomic History   Marital status: Married  Tobacco Use   Smoking status: Former    Current packs/day: 0.00    Average packs/day: 1 pack/day for 8.0 years (8.0 ttl pk-yrs)    Types: Cigarettes    Start date: 10/12/1964    Quit date: 10/13/1972    Years since quitting: 51.4   Smokeless tobacco: Former  Building Services Engineer status: Never Used  Substance and Sexual Activity   Alcohol use: No   Drug use: No   Sexual activity: Not Currently    Partners: Female    Comment: spouse   Social Drivers of Corporate Investment Banker Strain: Low Risk  (07/08/2023)   Overall Financial Resource Strain (CARDIA)    Difficulty of Paying Living Expenses: Not hard at all  Food Insecurity: No Food Insecurity (07/08/2023)   Hunger Vital Sign    Worried About Running Out of Food in the Last Year: Never true    Ran Out of Food in the Last Year: Never true  Transportation Needs: No Transportation Needs (07/08/2023)   PRAPARE - Administrator, Civil Service (Medical): No    Lack of Transportation (Non-Medical): No  Housing Stability: Unknown (10/13/2023)   Housing Stability Vital Sign    Unable to Pay for Housing in the Last Year: No    Homeless in the Last Year: No    Family History  Problem Relation Name Age of Onset   Cancer Mother         tumor in spine, top of neck   Myocardial Infarction (Heart attack) Father Lynwood Rosa    Lung cancer Father Carol Loftin        Deceased   Tuberculosis Father Shawon Denzer    Alcohol abuse Father Breslin Hemann        deceased   Myocardial Infarction (Heart attack) Sister     Brain cancer Brother     Other Brother         murdered    A comprehensive ROS was negative except for HPI  PE: BP (!) 142/80   Pulse 79   Ht 182.9 cm (6')   Wt 92.1 kg  (203 lb)   SpO2 98%   BMI 27.53 kg/m  General. Alert oriented x3  Skin. No suspicious lesions or moles.   Eyes. Sclera and conjunctiva clear; pupils equal round and reactive to light and accommodation; extraocular movements intact Ears. External normal; canals clear; tympanic membranes normal Nose. Mucosa healthy without drainage or ulceration Oropharynx. No suspicious lesions Neck. No swelling, masses, stiffness, pain, limited  movement, carotid pulses normal bilaterally, thyroid normal size, no masses palpated.  No bruits Lungs. Respirations unlabored; clear to auscultation bilaterally Back. No spinal deformity Cardiovascular. Heart regular rate and rhythm with 1/6 systolic murmur noted. No gallops, or rubs Abdomen. Soft; non tender; non distended; normoactive bowel sounds; no masses or organomegaly Lymph Nodes. No significant cervical, supraclavicular, axillary or inguinal lymphadenopathy noted Musculoskeletal. No deformities; no active joint inflammation Extremities. Normal, no edema Pulses. Dorsalis pedis palpable and symmetric bilaterally Neurologic. Alert and oriented; speech intact; face symmetrical; moves all extremities well  Goals      Lose Weight        Office Visit on 11/24/2023  Component Date Value Ref Range Status   Anaerobic Culture - [facility] 11/24/2023 Final report (!)   Final   Result 1 - [facility] 11/24/2023 Comment (!)   Final   Aerobic Culture - [facility] 11/24/2023 Final report (!)   Final   Result 1 - [facility] 11/24/2023 Klebsiella oxytoca (!)   Final   Result 2 - [facility] 11/24/2023 Comment (!)   Final   Result 3 - [facility] 11/24/2023 Mixed skin flora   Final   Antimicrobial Susceptibility - [facility] 11/24/2023 Comment   Final  Appointment on 10/28/2023  Component Date Value Ref Range Status   Cholesterol, Total 10/28/2023 97 (L)  100 - 200 mg/dL Final   Triglyceride 91/87/7974 211 (H)  35 - 199 mg/dL Final   HDL (High Density Lipoprotein) Cho*  10/28/2023 27.1 (L)  29.0 - 71.0 mg/dL Final   LDL Calculated 10/28/2023 28  0 - 130 mg/dL Final   VLDL Cholesterol 10/28/2023 42  mg/dL Final   Cholesterol/HDL Ratio 10/28/2023 3.6   Final   WBC (White Blood Cell Count) 10/28/2023 9.2  4.1 - 10.2 103/uL Final   RBC (Red Blood Cell Count) 10/28/2023 4.94  4.69 - 6.13 106/uL Final   Hemoglobin 10/28/2023 15.4  14.1 - 18.1 gm/dL Final   Hematocrit 91/87/7974 45.5  40.0 - 52.0 % Final   MCV (Mean Corpuscular Volume) 10/28/2023 92.1  80.0 - 100.0 fl Final   MCH (Mean Corpuscular Hemoglobin) 10/28/2023 31.2  27.0 - 31.2 pg Final   MCHC (Mean Corpuscular Hemoglobin * 10/28/2023 33.8  32.0 - 36.0 gm/dL Final   Platelet Count 10/28/2023 181  150 - 450 103/uL Final   RDW-CV (Red Cell Distribution Widt* 10/28/2023 13.2  11.6 - 14.8 % Final   MPV (Mean Platelet Volume) 10/28/2023 10.1  9.4 - 12.4 fl Final   Neutrophils 10/28/2023 6.27  1.50 - 7.80 103/uL Final   Lymphocytes 10/28/2023 2.13  1.00 - 3.60 103/uL Final   Monocytes 10/28/2023 0.56  0.00 - 1.50 103/uL Final   Eosinophils 10/28/2023 0.19  0.00 - 0.55 103/uL Final   Basophils 10/28/2023 0.04  0.00 - 0.09 103/uL Final   Neutrophil % 10/28/2023 68.0  32.0 - 70.0 % Final   Lymphocyte % 10/28/2023 23.1  10.0 - 50.0 % Final   Monocyte % 10/28/2023 6.1  4.0 - 13.0 % Final   Eosinophil % 10/28/2023 2.1  1.0 - 5.0 % Final   Basophil% 10/28/2023 0.4  0.0 - 2.0 % Final   Immature Granulocyte % 10/28/2023 0.3  <=0.7 % Final   Immature Granulocyte Count 10/28/2023 0.03  <=0.06 10^3/L Final   Glucose 10/28/2023 124 (H)  70 - 110 mg/dL Final   Sodium 91/87/7974 142  136 - 145 mmol/L Final   Potassium 10/28/2023 4.1  3.6 - 5.1 mmol/L Final   Chloride  10/28/2023 106  97 - 109 mmol/L Final   Carbon Dioxide (CO2) 10/28/2023 29.5  22.0 - 32.0 mmol/L Final   Urea Nitrogen (BUN) 10/28/2023 31 (H)  7 - 25 mg/dL Final   Creatinine 91/87/7974 2.2 (H)  0.7 - 1.3 mg/dL  Final   Glomerular Filtration Rate (eGFR) 10/28/2023 30 (L)  >60 mL/min/1.73sq m Final   Calcium  10/28/2023 9.5  8.7 - 10.3 mg/dL Final   AST  91/87/7974 12  8 - 39 U/L Final   ALT  10/28/2023 14  6 - 57 U/L Final   Alk Phos (alkaline Phosphatase) 10/28/2023 83  34 - 104 U/L Final   Albumin  10/28/2023 4.2  3.5 - 4.8 g/dL Final   Bilirubin, Total 10/28/2023 0.7  0.3 - 1.2 mg/dL Final   Protein, Total 10/28/2023 6.2  6.1 - 7.9 g/dL Final   A/G Ratio 91/87/7974 2.1  1.0 - 5.0 gm/dL Final   Thyroid Stimulating Hormone (TSH) 10/28/2023 1.404  0.450-5.330 uIU/ml uIU/mL Final   Thyroxine, Free (FT4) 10/28/2023 0.77  0.66 - 1.14 ng/dL Final   Color 91/87/7974 Light Yellow  Colorless, Straw, Light Yellow, Yellow, Dark Yellow Final   Clarity 10/28/2023 Clear  Clear Final   Specific Gravity 10/28/2023 1.016  1.005 - 1.030 Final   pH, Urine 10/28/2023 5.5  5.0 - 8.0 Final   Protein, Urinalysis 10/28/2023 Trace (!)  Negative mg/dL Final   Glucose, Urinalysis 10/28/2023 4+ (!)  Negative mg/dL Final   Ketones, Urinalysis 10/28/2023 Negative  Negative mg/dL Final   Blood, Urinalysis 10/28/2023 Negative  Negative Final   Nitrite, Urinalysis 10/28/2023 Negative  Negative Final   Leukocyte Esterase, Urinalysis 10/28/2023 Negative  Negative Final   Bilirubin, Urinalysis 10/28/2023 Negative  Negative Final   Urobilinogen, Urinalysis 10/28/2023 0.2  0.2 - 1.0 mg/dL Final   WBC, UA 91/87/7974 <1  <=5 /hpf Final   Red Blood Cells, Urinalysis 10/28/2023 <1  <=3 /hpf Final   Bacteria, Urinalysis 10/28/2023 0-5  0 - 5 /hpf Final   Squamous Epithelial Cells, Urinaly* 10/28/2023 0  /hpf Final   Hemoglobin A1C 10/28/2023 6.5 (H)  4.2 - 5.6 % Final   Average Blood Glucose (Calc) 10/28/2023 140  mg/dL Final  Appointment on 95/84/7974  Component Date Value Ref Range Status   Cholesterol, Total 07/01/2023 96 (L)  100 - 200 mg/dL Final   Triglyceride 95/84/7974 187  35 - 199 mg/dL Final    HDL (High Density Lipoprotein) Cho* 07/01/2023 25.8 (L)  29.0 - 71.0 mg/dL Final   LDL Calculated 07/01/2023 33  0 - 130 mg/dL Final   VLDL Cholesterol 07/01/2023 37  mg/dL Final   Cholesterol/HDL Ratio 07/01/2023 3.7   Final   WBC (White Blood Cell Count) 07/01/2023 9.0  4.1 - 10.2 103/uL Final   RBC (Red Blood Cell Count) 07/01/2023 5.02  4.69 - 6.13 106/uL Final   Hemoglobin 07/01/2023 15.4  14.1 - 18.1 gm/dL Final   Hematocrit 95/84/7974 45.4  40.0 - 52.0 % Final   MCV (Mean Corpuscular Volume) 07/01/2023 90.4  80.0 - 100.0 fl Final   MCH (Mean Corpuscular Hemoglobin) 07/01/2023 30.7  27.0 - 31.2 pg Final   MCHC (Mean Corpuscular Hemoglobin * 07/01/2023 33.9  32.0 - 36.0 gm/dL Final   Platelet Count 07/01/2023 175  150 - 450 103/uL Final   RDW-CV (Red Cell Distribution Widt* 07/01/2023 12.9  11.6 - 14.8 % Final   MPV (Mean Platelet Volume) 07/01/2023 9.5  9.4 - 12.4 fl Final   Neutrophils  07/01/2023 6.42  1.50 - 7.80 103/uL Final   Lymphocytes 07/01/2023 1.70  1.00 - 3.60 103/uL Final   Monocytes 07/01/2023 0.63  0.00 - 1.50 103/uL Final   Eosinophils 07/01/2023 0.20  0.00 - 0.55 103/uL Final   Basophils 07/01/2023 0.05  0.00 - 0.09 103/uL Final   Neutrophil % 07/01/2023 71.1 (H)  32.0 - 70.0 % Final   Lymphocyte % 07/01/2023 18.8  10.0 - 50.0 % Final   Monocyte % 07/01/2023 7.0  4.0 - 13.0 % Final   Eosinophil % 07/01/2023 2.2  1.0 - 5.0 % Final   Basophil% 07/01/2023 0.6  0.0 - 2.0 % Final   Immature Granulocyte % 07/01/2023 0.3  <=0.7 % Final   Immature Granulocyte Count 07/01/2023 0.03  <=0.06 10^3/L Final   Glucose 07/01/2023 139 (H)  70 - 110 mg/dL Final   Sodium 95/84/7974 143  136 - 145 mmol/L Final   Potassium 07/01/2023 4.4  3.6 - 5.1 mmol/L Final   Chloride 07/01/2023 107  97 - 109 mmol/L Final   Carbon Dioxide (CO2) 07/01/2023 26.9  22.0 - 32.0 mmol/L Final   Urea Nitrogen (BUN) 07/01/2023 38 (H)  7 - 25 mg/dL Final    Creatinine 95/84/7974 2.1 (H)  0.7 - 1.3 mg/dL Final   Glomerular Filtration Rate (eGFR) 07/01/2023 32 (L)  >60 mL/min/1.73sq m Final   Calcium  07/01/2023 9.4  8.7 - 10.3 mg/dL Final   AST  95/84/7974 14  8 - 39 U/L Final   ALT  07/01/2023 17  6 - 57 U/L Final   Alk Phos (alkaline Phosphatase) 07/01/2023 90  34 - 104 U/L Final   Albumin  07/01/2023 4.4  3.5 - 4.8 g/dL Final   Bilirubin, Total 07/01/2023 0.7  0.3 - 1.2 mg/dL Final   Protein, Total 07/01/2023 6.4  6.1 - 7.9 g/dL Final   A/G Ratio 95/84/7974 2.2  1.0 - 5.0 gm/dL Final   PSA (Prostate Specific Antigen), T* 07/01/2023 1.87  0.10 - 4.00 ng/mL Final   Thyroid Stimulating Hormone (TSH) 07/01/2023 0.315 (L)  0.450-5.330 uIU/ml uIU/mL Final   Color 07/01/2023 Light Yellow  Colorless, Straw, Light Yellow, Yellow, Dark Yellow Final   Clarity 07/01/2023 Clear  Clear Final   Specific Gravity 07/01/2023 1.015  1.005 - 1.030 Final   pH, Urine 07/01/2023 5.5  5.0 - 8.0 Final   Protein, Urinalysis 07/01/2023 1+ (!)  Negative mg/dL Final   Glucose, Urinalysis 07/01/2023 4+ (!)  Negative mg/dL Final   Ketones, Urinalysis 07/01/2023 Negative  Negative mg/dL Final   Blood, Urinalysis 07/01/2023 Negative  Negative Final   Nitrite, Urinalysis 07/01/2023 Negative  Negative Final   Leukocyte Esterase, Urinalysis 07/01/2023 Negative  Negative Final   Bilirubin, Urinalysis 07/01/2023 Negative  Negative Final   Urobilinogen, Urinalysis 07/01/2023 0.2  0.2 - 1.0 mg/dL Final   WBC, UA 95/84/7974 1  <=5 /hpf Final   Red Blood Cells, Urinalysis 07/01/2023 <1  <=3 /hpf Final   Bacteria, Urinalysis 07/01/2023 0-5  0 - 5 /hpf Final   Squamous Epithelial Cells, Urinaly* 07/01/2023 0  /hpf Final   Hemoglobin A1C 07/01/2023 6.8 (H)  4.2 - 5.6 % Final   Average Blood Glucose (Calc) 07/01/2023 148  mg/dL Final   DIAGNOSIS: Annual physical exam  (primary encounter diagnosis)  Depression screening Plan: Depression Screen -(PHQ-  2/9, BDI)  ASCVD (arteriosclerotic cardiovascular disease)  HTN, goal below 140/80  Pure hypercholesterolemia  Diabetes mellitus with complication (CMS/HHS-HCC)  Stage 3 chronic kidney disease, unspecified whether  stage 3a or 3b CKD (CMS-HCC)  Secondary hyperparathyroidism of renal origin (HHS-HCC)  Hyperthyroidism without crisis   PLAN: Will refer back to Cardiology for CP. To ER if worse. Labs today. RTC 4 mo, sooner if needed     Attestation Statement:   I personally performed the service. (TP)  Reyes JONETTA Costa, MD, MD

## 2024-03-08 ENCOUNTER — Encounter: Payer: Self-pay | Admitting: Cardiology

## 2024-03-08 ENCOUNTER — Ambulatory Visit: Attending: Cardiology | Admitting: Cardiology

## 2024-03-08 VITALS — BP 118/60 | HR 79 | Ht 73.0 in | Wt 204.2 lb

## 2024-03-08 DIAGNOSIS — I2510 Atherosclerotic heart disease of native coronary artery without angina pectoris: Secondary | ICD-10-CM | POA: Diagnosis not present

## 2024-03-08 DIAGNOSIS — N183 Chronic kidney disease, stage 3 unspecified: Secondary | ICD-10-CM

## 2024-03-08 DIAGNOSIS — E118 Type 2 diabetes mellitus with unspecified complications: Secondary | ICD-10-CM | POA: Diagnosis not present

## 2024-03-08 DIAGNOSIS — E782 Mixed hyperlipidemia: Secondary | ICD-10-CM | POA: Diagnosis not present

## 2024-03-08 DIAGNOSIS — Z951 Presence of aortocoronary bypass graft: Secondary | ICD-10-CM | POA: Diagnosis not present

## 2024-03-08 DIAGNOSIS — I5032 Chronic diastolic (congestive) heart failure: Secondary | ICD-10-CM

## 2024-03-08 DIAGNOSIS — I10 Essential (primary) hypertension: Secondary | ICD-10-CM | POA: Diagnosis not present

## 2024-03-08 DIAGNOSIS — R072 Precordial pain: Secondary | ICD-10-CM | POA: Diagnosis not present

## 2024-03-08 DIAGNOSIS — I739 Peripheral vascular disease, unspecified: Secondary | ICD-10-CM

## 2024-03-08 NOTE — Progress Notes (Signed)
 " Cardiology Office Note   Date:  03/08/2024  ID:  James Clayton, James Clayton Jul 21, 1945, MRN [id number] PCP: Auston Reyes BIRCH, MD  [facility] Providers Cardiologist:  Deatrice Cage, MD Cardiology APP:  Vivienne Lonni Ingle, NP     History of Present Illness James Clayton is a 78 y.o. male with past medical history of coronary disease status post CABG x 3 (01/2016), with postoperative atrial fibrillation, chronic HFpEF, hypertension, hyperlipidemia, peripheral arterial disease status post lower extremity interventions, nonobstructive renal artery stenosis, type 2 diabetes, stage III-IV chronic kidney disease, and obesity, who presents today for follow-up.   In the setting of refractory hypertension underwent renal artery angiogram in October 2020 which showed moderate renal artery stenosis estimated to be approximately 60% bilaterally.  During angiogram he was also noted to have bilateral common iliac stenosis right greater than left.  Noninvasive vascular testing showed an ABI of 0.69 on the right and was normal on the left.  In the setting of lifestyle-limiting right lower extremity claudication and poorly healing ulceration to the right foot he underwent peripheral angiogram in November 2021 showing occluded right common iliac artery with 80% stenosis of the left common iliac artery and mild disease in the common femoral arteries.  He underwent covered stenting of the bilateral common iliac arteries with additional stents in the distal aorta.  Lifelong dual antiplatelet therapy was recommended.  Postprocedure ABI/aortoiliac duplex showed patent stents and normal ABIs.  Following intervention he had complete healing of the right foot ulceration.  Been evaluated in clinic in January 2025 he was doing well without chest pain or claudication.  He had recently undergone repeat ABIs showed patent iliac stents with borderline restenosis on the right side with plan for repeat study.  He had  some emotional stressors.  Signs of multiple myeloma but periorbital and family.  From a cardiac standpoint he had been doing well.  He was continued on DAPT with aspirin  and clopidogrel .  He was to follow-up ABIs in 1 year.   He returns to clinic today after recent yearly evaluation completed by PCP where he had complaints of chest discomfort.  He denied any other associated symptoms of shortness of breath dyspnea on exertion or palpitations.  He states that he has been chest discomfort or chest pressure with exertion since the summer.  States that the only aggravating factor is exertion and it is relieved with rest.  States he previously had pain after he had his open heart surgery that was more in the epigastric area and he states that this discomfort is more to the left side of his chest he denies any symptoms with rest and continues to deny any associated symptoms.  States that he has been compliant with his current medication regimen without any undue side effects.  Denies any hospitalizations or visits to the emergency department.  ROS: 10 point review of system has been reviewed and considered negative the exception was been listed in the HPI  Studies Reviewed EKG Interpretation Date/Time:  Monday March 08 2024 08:17:16 EST Ventricular Rate:  79 PR Interval:  182 QRS Duration:  88 QT Interval:  374 QTC Calculation: 428 R Axis:   38  Text Interpretation: Normal sinus rhythm Normal ECG When compared with ECG of 23-Oct-2023 08:31, No significant change was found Confirmed by Gerard Frederick ([id number]) on 03/08/2024 8:18:51 AM    ABI w/wo TBI 09/10/2023 Summary:  Right: Resting right ankle-brachial index is within normal range. The  right toe-brachial  index is normal.   Left: Resting left ankle-brachial index is within normal range. The left  toe-brachial index is normal.   Risk Assessment/Calculations           Physical Exam VS:  BP 118/60 (BP Location: Left Arm, Patient Position:  Sitting)   Pulse 79   Ht 6' 1 (1.854 m)   Wt 204 lb 3.2 oz (92.6 kg)   SpO2 99%   BMI 26.94 kg/m        Wt Readings from Last 3 Encounters:  03/08/24 204 lb 3.2 oz (92.6 kg)  10/23/23 206 lb 12.8 oz (93.8 kg)  04/11/23 210 lb 9.6 oz (95.5 kg)    GEN: Well nourished, well developed in no acute distress NECK: No JVD; No carotid bruits CARDIAC: RRR, II/VI systolic murmur R/LUSB without rubs or gallops RESPIRATORY:  Clear to auscultation without rales, wheezing or rhonchi  ABDOMEN: Soft, non-tender, non-distended EXTREMITIES:  No edema; No deformity   ASSESSMENT AND PLAN Coronary artery disease status post CABG x 3 in 2017 which has recently had new onset of chest discomfort without associated symptoms.  States that this chest discomfort and precordial pain has been since this summer primarily on exertion.  He notices when he is walking across the yard to take his daughter's dog out or when he has been mowing grass.  He states it is relieved with rest.  EKG today reveals sinus rhythm with rate of 79 with no acute ischemic changes noted.  He has been continued on aspirin  81 mg daily, clopidogrel  75 mg daily, he continues to take Lovaza  1 g twice daily and atorvastatin  40 mg daily.  With his exertional chest discomfort he has been scheduled for cardiac PET stress to rule out any ischemic causes.  Peripheral arterial disease with previous bilateral common iliac and distal aortic stenting in late 2021 with stable ABIs in June 2025.  He continues to deny any claudication symptoms.  Remains on lifelong dual antiplatelet therapy.  He will need yearly ABIs with ABIs were completed in June 2025.  Primary hypertension with a blood pressure today of 118/60.  Blood pressure has improved.  He is continued on amlodipine , carvedilol , HCTZ, losartan , and hydralazine .  He has been encouraged to continue to monitor his pressures 1 to 2 hours postmedication administration at home as  well.  Hyperlipidemia/hypertriglyceridemia with most recent LDL of 21 and triglycerides of 223.  He is continued on atorvastatin  40 mg daily and Lovaza  1 g twice daily.  Chronic HFpEF.  He is continued on beta-blocker therapy, Jardiance, but due to kidney function MRA therapy has not been initiated.  He continues to remain euvolemic on exam with NYHA class I symptoms.  No further medication changes were made at this time.  Type 2 diabetes with a hemoglobin A1c of 6.7.  He has been continued on his Jardiance.  Ongoing management per PCP.  Stage III chronic kidney disease with a serum creatinine of 2.0 that remains stable.  He has been continued on his current medication regimen.  Encouraged to maintain adequate hydration and avoid nephrotoxic agents.  Ongoing management per nephrology.  Hyperthyroidism with his last TSH 1.88.  He is continued on methimazole 2.5 mg daily.    Informed Consent   Shared Decision Making/Informed Consent The risks [chest pain, shortness of breath, cardiac arrhythmias, dizziness, blood pressure fluctuations, myocardial infarction, stroke/transient ischemic attack, nausea, vomiting, allergic reaction, radiation exposure, metallic taste sensation and life-threatening complications (estimated to be 1 in 10,000)], benefits (  risk stratification, diagnosing coronary artery disease, treatment guidance) and alternatives of a cardiac PET stress test were discussed in detail with Mr. Nunn and he agrees to proceed.     Dispo: Patient to return to clinic to see MD/APP once testing is completed or sooner if needed for further evaluation.  Signed, Emina Ribaudo, NP   "

## 2024-03-08 NOTE — Patient Instructions (Addendum)
 Medication Instructions:  Your physician recommends that you continue on your current medications as directed. Please refer to the Current Medication list given to you today.   *If you need a refill on your cardiac medications before your next appointment, please call your pharmacy*  Lab Work: No labs ordered today  If you have labs (blood work) drawn today and your tests are completely normal, you will receive your results only by: MyChart Message (if you have MyChart) OR A paper copy in the mail If you have any lab test that is abnormal or we need to change your treatment, we will call you to review the results.  Testing/Procedures:    Please report to Radiology at the [facility] Main Entrance 30 minutes early for your test.  [address] [place], [place] [address]                         OR   Please report to Radiology at [facility] Main Entrance, medical mall, 30 mins prior to your test.  [address]  [place], [place]  How to Prepare for Your Cardiac PET/CT Stress Test:  Nothing to eat or drink, except water , 3 hours prior to arrival time.  NO caffeine/decaffeinated products, or chocolate 12 hours prior to arrival. (Please note decaffeinated beverages (teas/coffees) still contain caffeine).  If you have caffeine within 12 hours prior, the test will need to be rescheduled.  Medication instructions: Do not take erectile dysfunction medications for 72 hours prior to test (sildenafil , tadalafil) Do not take nitrates (isosorbide  mononitrate, Ranexa) the day before or day of test Do not take tamsulosin the day before or morning of test Hold theophylline containing medications for 12 hours. Hold Dipyridamole 48 hours prior to the test.  Diabetic Preparation: If able to eat breakfast prior to 3 hour fasting, you may take all medications, including your insulin . Do not worry if you miss your breakfast dose of insulin  - start at your next  meal. If you do not eat prior to 3 hour fast-Hold all diabetes (oral and insulin ) medications. Patients who wear a continuous glucose monitor MUST remove the device prior to scanning.  You may take your remaining medications with water .  NO perfume, cologne or lotion on chest or abdomen area. FEMALES - Please avoid wearing dresses to this appointment.  Total time is 1 to 2 hours; you may want to bring reading material for the waiting time.  IF YOU THINK YOU MAY BE PREGNANT, OR ARE NURSING PLEASE INFORM THE TECHNOLOGIST.  In preparation for your appointment, medication and supplies will be purchased.  Appointment availability is limited, so if you need to cancel or reschedule, please call the Radiology Department Scheduler at [phone] 24 hours in advance to avoid a cancellation fee of $100.00  What to Expect When you Arrive:  Once you arrive and check in for your appointment, you will be taken to a preparation room within the Radiology Department.  A technologist or Nurse will obtain your medical history, verify that you are correctly prepped for the exam, and explain the procedure.  Afterwards, an IV will be started in your arm and electrodes will be placed on your skin for EKG monitoring during the stress portion of the exam. Then you will be escorted to the PET/CT scanner.  There, staff will get you positioned on the scanner and obtain a blood pressure and EKG.  During the exam, you will  continue to be connected to the EKG and blood pressure machines.  A small, safe amount of a radioactive tracer will be injected in your IV to obtain a series of pictures of your heart along with an injection of a stress agent.    After your Exam:  It is recommended that you eat a meal and drink a caffeinated beverage to counter act any effects of the stress agent.  Drink plenty of fluids for the remainder of the day and urinate frequently for the first couple of hours after the exam.  Your doctor will  inform you of your test results within 7-10 business days.  For more information and frequently asked questions, please visit our website: [email or web address]  For questions about your test or how to prepare for your test, please call: Cardiac Imaging Nurse Navigators Office: [phone]  Follow-Up: At [facility], you and your health needs are our priority.  As part of our continuing mission to provide you with exceptional heart care, our providers are all part of one team.  This team includes your primary Cardiologist (physician) and Advanced Practice Providers or APPs (Physician Assistants and Nurse Practitioners) who all work together to provide you with the care you need, when you need it.  Your next appointment:   After testing completed  Provider:   You may see Deatrice Cage, MD or one of the following Advanced Practice Providers on your designated Care Team:   Lonni Meager, NP

## 2024-03-17 ENCOUNTER — Ambulatory Visit

## 2024-03-25 ENCOUNTER — Ambulatory Visit: Admitting: Nurse Practitioner

## 2024-04-06 ENCOUNTER — Encounter (HOSPITAL_COMMUNITY): Payer: Self-pay

## 2024-04-08 ENCOUNTER — Ambulatory Visit
Admission: RE | Admit: 2024-04-08 | Discharge: 2024-04-08 | Disposition: A | Discharge status: Home / Self Care | Source: Ambulatory Visit | Attending: Cardiology | Admitting: Cardiology

## 2024-04-08 DIAGNOSIS — R072 Precordial pain: Secondary | ICD-10-CM | POA: Insufficient documentation

## 2024-04-08 DIAGNOSIS — K449 Diaphragmatic hernia without obstruction or gangrene: Secondary | ICD-10-CM | POA: Diagnosis not present

## 2024-04-08 DIAGNOSIS — I700 Atherosclerosis of aorta: Secondary | ICD-10-CM | POA: Diagnosis not present

## 2024-04-08 LAB — NM PET CT CARDIAC PERFUSION MULTI W/ABSOLUTE BLOODFLOW
MBFR: 1.75
Nuc Rest EF: 46 %
Nuc Stress EF: 51 %
Peak HR: 90 {beats}/min
Rest HR: 90 {beats}/min
Rest MBF: 0.83 ml/g/min
Rest Nuclear Isotope Dose: 24.2 mCi
SRS: 0
SSS: 8
ST Depression (mm): 0 mm
Stress MBF: 1.45 ml/g/min
Stress Nuclear Isotope Dose: 24.3 mCi
TID: 1.09

## 2024-04-08 MED ORDER — REGADENOSON 0.4 MG/5ML IV SOLN
0.4000 mg | Freq: Once | INTRAVENOUS | Status: AC
  Administered 2024-04-08: 0.4 mg via INTRAVENOUS
  Filled 2024-04-08: qty 5

## 2024-04-08 MED ORDER — RUBIDIUM RB82 GENERATOR (RUBYFILL)
25.0000 | PACK | Freq: Once | INTRAVENOUS | Status: AC
  Administered 2024-04-08: 24.28 via INTRAVENOUS

## 2024-04-08 MED ORDER — REGADENOSON 0.4 MG/5ML IV SOLN
INTRAVENOUS | Status: AC
  Filled 2024-04-08: qty 5

## 2024-04-08 MED ORDER — RUBIDIUM RB82 GENERATOR (RUBYFILL)
25.0000 | PACK | Freq: Once | INTRAVENOUS | Status: AC
  Administered 2024-04-08: 24.22 via INTRAVENOUS

## 2024-04-09 ENCOUNTER — Encounter: Payer: Self-pay | Admitting: Nurse Practitioner

## 2024-04-09 ENCOUNTER — Ambulatory Visit: Attending: Nurse Practitioner | Admitting: Nurse Practitioner

## 2024-04-09 ENCOUNTER — Ambulatory Visit: Payer: Self-pay | Admitting: Cardiology

## 2024-04-09 VITALS — BP 118/78 | HR 85 | Ht 73.0 in | Wt 202.8 lb

## 2024-04-09 DIAGNOSIS — E782 Mixed hyperlipidemia: Secondary | ICD-10-CM

## 2024-04-09 DIAGNOSIS — E118 Type 2 diabetes mellitus with unspecified complications: Secondary | ICD-10-CM | POA: Diagnosis not present

## 2024-04-09 DIAGNOSIS — I25118 Atherosclerotic heart disease of native coronary artery with other forms of angina pectoris: Secondary | ICD-10-CM

## 2024-04-09 DIAGNOSIS — I739 Peripheral vascular disease, unspecified: Secondary | ICD-10-CM

## 2024-04-09 DIAGNOSIS — R072 Precordial pain: Secondary | ICD-10-CM

## 2024-04-09 DIAGNOSIS — N183 Chronic kidney disease, stage 3 unspecified: Secondary | ICD-10-CM | POA: Diagnosis not present

## 2024-04-09 DIAGNOSIS — I2510 Atherosclerotic heart disease of native coronary artery without angina pectoris: Secondary | ICD-10-CM

## 2024-04-09 DIAGNOSIS — I5032 Chronic diastolic (congestive) heart failure: Secondary | ICD-10-CM | POA: Diagnosis not present

## 2024-04-09 DIAGNOSIS — I10 Essential (primary) hypertension: Secondary | ICD-10-CM | POA: Diagnosis not present

## 2024-04-09 MED ORDER — ISOSORBIDE MONONITRATE ER 30 MG PO TB24: 15.0000 mg | ORAL_TABLET | Freq: Every day | ORAL | 3 refills | Status: AC

## 2024-04-09 NOTE — Progress Notes (Signed)
 "    Office Visit    Patient Name: James Clayton Date of Encounter: 04/09/2024  Primary Care Provider:  Auston Reyes BIRCH, MD Primary Cardiologist:  James Cage, MD  Cardiology APP:  James Lonni Ingle, NP   Chief Complaint    79 y.o. male with a history of CAD status post CABG times 3 in November 2017 with postoperative atrial fibrillation, HFpEF, hypertension, hyperlipidemia, peripheral arterial disease status post lower extremity interventions, nonobstructive renal artery stenosis, diabetes, stage III-IV chronic kidney disease, and obesity, presents for follow-up of CAD.   Past Medical History   Subjective   Past Medical History:  Diagnosis Date   CAD (coronary artery disease)    a. LHC 02/12/16: ostLM-LM 90% severely calcified, ost-mLAD 90%, ostRamus-Ramus 80%, ostLCx-pLCx 90%, ostRCA-pRCA 100% severely calcified, LVEDP severely elevated; b. 01/2016 CABG x 3 (LIMA->LAD, VG->PDA, VG->RI); c. 03/2024 PET Stress: basal-mid lat ischemia (LCX territory). EF 46 rest, 51 stress. Intermediate risk.   Chronic diastolic CHF (congestive heart failure) (HCC)    a. echo 02/10/16: EF 50-55%, mild inf HK, GR2DD, mild to mod MR, PASP 46 mmHg; b. 04/2018 Echo: EF >55%. Triv AI. Mild TR; c. 02/2022 Echo: Nl LV fxn. Triv AI. Mild TR.   CKD (chronic kidney disease), stage III (HCC)    Diabetes mellitus with complication (HCC)    HLD (hyperlipidemia)    Hypertensive heart disease    Ileus (HCC)    a. 02/2016 following CABG.   Morbid obesity (HCC)    PAD (peripheral artery disease)    a. 12/2018 Periph Angio: RCIA 99p, LCIA 80p; b. 01/2020 PV Angio/PTA: RRA 60, RCIA 100, RCFA mild dzs, LCIA 80, LIIA 40ost, LCFA mild dzs-->?s/p covered stenting to bilat CIAs w/ additional stents into distal Ao; c. 08/2023 ABIs: R 1.02, L 1.05. Patent bilat iliac stents on duplex.   Post-operative Atrial Fibrillation    a. 01/2016 following CABG.   Renal artery stenosis    a. 12/2018 Renal angio: RRA  50-60, LRA 60-->Med Rx.   Systolic murmur    Past Surgical History:  Procedure Laterality Date   ABDOMINAL AORTOGRAM W/LOWER EXTREMITY N/A 02/02/2020   Procedure: ABDOMINAL AORTOGRAM W/LOWER EXTREMITY;  Surgeon: James Clayton LABOR, MD;  Location: [facility] INVASIVE CV LAB;  Service: Cardiovascular;  Laterality: N/A;   CARDIAC CATHETERIZATION N/A 02/12/2016   Procedure: Left Heart Cath and Coronary Angiography;  Surgeon: James LABOR Cage, MD;  Location: [facility] INVASIVE CV LAB;  Service: Cardiovascular;  Laterality: N/A;   COLONOSCOPY WITH PROPOFOL  N/A 12/27/2016   Procedure: COLONOSCOPY WITH PROPOFOL ;  Surgeon: James Lamar DASEN, MD;  Location: [facility] ENDOSCOPY;  Service: Endoscopy;  Laterality: N/A;   CORONARY ARTERY BYPASS GRAFT N/A 02/13/2016   Procedure: CORONARY ARTERY BYPASS GRAFTING (CABG)x 3 with endoscopic harvesting of right saphenous  vein LIMA to LAD SVG to Ramus SVG to PDA;  Surgeon: James Fleeta Ochoa, MD;  Location: [facility] OR;  Service: Open Heart Surgery;  Laterality: N/A;   JOINT REPLACEMENT     PERIPHERAL VASCULAR INTERVENTION  02/02/2020   Procedure: PERIPHERAL VASCULAR INTERVENTION;  Surgeon: James Clayton LABOR, MD;  Location: [facility] INVASIVE CV LAB;  Service: Cardiovascular;;   RENAL ANGIOGRAPHY N/A 12/23/2018   Procedure: RENAL ANGIOGRAPHY;  Surgeon: James Clayton LABOR, MD;  Location: [facility] INVASIVE CV LAB;  Service: Cardiovascular;  Laterality: N/A;   TEE WITHOUT CARDIOVERSION N/A 02/13/2016   Procedure: TRANSESOPHAGEAL ECHOCARDIOGRAM (TEE);  Surgeon: James Fleeta Ochoa, MD;  Location: [facility] OR;  Service: Open Heart Surgery;  Laterality: N/A;    Allergies  Allergies[1]     History of Present Illness      79 y.o. y/o male with the above complex past medical history including CAD status post non-STEMI and CABG x3 in November 2017, postoperative atrial fibrillation, HFpEF, hypertension, hyperlipidemia, peripheral arterial disease, nonobstructive renal artery stenosis, diabetes, stage III-IV chronic  kidney disease, and obesity.  In the setting of refractory hypertension, he underwent renal artery angiography in October 2020 which showed moderate renal artery stenosis estimated to be approximately 60% bilaterally.  During angiography, he was also noted to have bilateral common iliac stenoses-right greater than left.  Noninvasive vascular testing showed an ABI of 0.69 on the right and was normal on the left.  In the setting of lifestyle limiting right lower extremity claudication and poorly healing ulceration on the right foot, he underwent peripheral angiography in November 2021 showing an occluded right common iliac artery, and 80% stenosis in the left common iliac artery, and mild disease in the common femoral arteries.  He underwent covered stenting of the bilateral common iliac arteries with additional stents in the distal aorta.  Lifelong dual antiplatelet therapy was recommended.  Post procedure ABI/aortoiliac duplex showed patent stents and normal ABIs.  Following intervention, he had complete healing of his right foot ulceration.        [name] was seen in cardiology clinic in late December 2025 due to progressive exertional left chest pressure and dyspnea.  PET stress was performed yesterday and was intermediate risk with basal to mid lateral ischemia (circumflex territory).  He says that since his last visit, he has not been experiencing very much chest pain or dyspnea.  He also notes he has been a little bit less active due to colder weather.  He still get out and play with his dog some and has not been limited by chest pain.  He denies palpitations, PND, orthopnea, dizziness, syncope, or early satiety.  He sometimes notes mild swelling above his sock line at the end of the day.  We discussed medical management of ischemia in the setting of known ischemic heart disease, and he was agreeable to start long-acting nitrate therapy. Objective   Home Medications    Current Outpatient  Medications  Medication Sig Dispense Refill   acetaminophen  (TYLENOL ) 500 MG tablet Take 500 mg by mouth daily.     allopurinol  (ZYLOPRIM ) 100 MG tablet Take 100 mg by mouth 2 (two) times daily.   12   amLODipine  (NORVASC ) 10 MG tablet Take 1 tablet (10 mg total) by mouth daily. 90 tablet 1   ascorbic acid  (VITAMIN C) 500 MG tablet Take 500 mg by mouth daily.     aspirin  81 MG EC tablet Take 1 tablet (81 mg total) by mouth daily.     atorvastatin  (LIPITOR) 40 MG tablet Take 1 tablet (40 mg total) by mouth daily. 30 tablet 1   carvedilol  (COREG ) 25 MG tablet Take 1 tablet (25 mg total) by mouth 2 (two) times daily with a meal. 180 tablet 1   clopidogrel  (PLAVIX ) 75 MG tablet Take 1 tablet (75 mg total) by mouth daily. 30 tablet 2   empagliflozin (JARDIANCE) 25 MG TABS tablet Take 25 mg by mouth daily.     gabapentin  (NEURONTIN ) 300 MG capsule Take 300 mg by mouth 2 (two) times daily.     hydrALAZINE  (APRESOLINE ) 25 MG tablet Take 1 tablet (25 mg total) by mouth 3 (three) times daily. 270 tablet 1  hydrochlorothiazide  (HYDRODIURIL ) 25 MG tablet Take 25 mg by mouth daily.      losartan  (COZAAR ) 100 MG tablet Take 100 mg by mouth daily.     methimazole (TAPAZOLE) 5 MG tablet Take 2.5 mg by mouth daily.     Multiple Vitamin (MULTIVITAMIN WITH MINERALS) TABS tablet Take 1 tablet by mouth daily.     omega-3 acid ethyl esters (LOVAZA ) 1 g capsule Take 1 capsule (1 g total) by mouth 2 (two) times daily.     Semaglutide , 2 MG/DOSE, (OZEMPIC , 2 MG/DOSE,) 8 MG/3ML SOPN Inject into the skin once a week.     No current facility-administered medications for this visit.     Physical Exam    VS:  BP 118/78 (BP Location: Left Arm, Patient Position: Sitting, Cuff Size: Normal)   Pulse 85   Ht 6' 1 (1.854 m)   Wt 202 lb 12.8 oz (92 kg)   SpO2 98%   BMI 26.76 kg/m  , BMI Body mass index is 26.76 kg/m.          GEN: Well nourished, well developed, in no acute distress. HEENT: normal. Neck: Supple,  no JVD, carotid bruits, or masses. Cardiac: RRR, 2/6 systolic murmur heard throughout, no rubs or gallops. No clubbing, cyanosis, trace bilateral edema above his sock line.  Radials 2+/PT 2+ and equal bilaterally.  Respiratory:  Respirations regular and unlabored, clear to auscultation bilaterally. GI: Soft, nontender, nondistended, BS + x 4. MS: no deformity or atrophy. Skin: warm and dry, no rash. Neuro:  Strength and sensation are intact. Psych: Normal affect.  Accessory Clinical Findings    ECG personally reviewed by me today - EKG Interpretation Date/Time:  Friday April 09 2024 08:25:51 EST Ventricular Rate:  80 PR Interval:  184 QRS Duration:  90 QT Interval:  350 QTC Calculation: 403 R Axis:   38  Text Interpretation: Normal sinus rhythm with sinus arrhythmia Normal ECG Confirmed by James Bruckner [phone]) on 04/09/2024 8:47:33 AM  - no acute changes.  Labs dated March 02, 2024 Care Everywhere:  Hemoglobin 15.2, hematocrit 46.0, WBC 9.9, platelets 188 Sodium 144, potassium 4.4, chloride 104, CO2 31.9, BUN 40, creatinine 2.0, glucose 86 Total bilirubin 0.6, alkaline phosphatase 87, AST 15, ALT 16 Calcium  9.8, albumin  4.5, total protein 6.5 Total cholesterol 94, HDL 28.9, triglycerides 223, LDL 21 TSH 1.888, free T4 9.19 Hemoglobin A1c 6.7    Assessment & Plan    1.  Stable angina/coronary artery disease: Status post CABG x 3 in 2017.  Seen in December due to progressive dyspnea and exertional chest pressure.  Lexiscan  PET stress test performed yesterday and showed lateral ischemia in the circumflex territory.  He has known multivessel native disease.  He does have a vein graft to the ramus intermedius.  Since his last visit, he has noted some improvement in activity tolerance without chest pain or dyspnea as frequently.  We discussed his stress test results in detail today.  The study was intermediate overall however, in the setting of chronic kidney disease with a  creatinine of 2.0 last month, ideally would like to avoid diagnostic catheterization.  He was in agreement with this.  We agreed to optimize medical therapy and I added Imdur  50 mg daily.  Will plan for follow-up in approximately 1 to 2 months to reevaluate symptoms and consider further titration of nitrate therapy.  Continue aspirin , statin, beta-blocker, clopidogrel , ARB, calcium  channel blocker.  2.  Peripheral arterial disease: Status post bilateral common iliac and  distal aortic stenting in late 2021 with stable ABIs in June 2025.  Denies claudication.  Remains on dual antiplatelet therapy and statin.  3.  Primary hypertension: Stable on current regimen.  4.  Hypertriglyceridemia/hyperlipidemia: Total cholesterol 94 with triglycerides of 223 and LDL of 21 in December 2025.  He remains on statin and Lovaza  therapy.  5.  Type 2 diabetes mellitus: A1c 6.7 in December.  Managed by primary care with Jardiance and Ozempic .  Also on ARB and statin.  6.  Chronic HFpEF: Euvolemic with stable heart rate and blood pressure.  Continue current regimen.  7.  Stage III chronic kidney disease: Creatinine was 2.0 in December.  Remains on ARB and followed by nephrology.  8.  Hyperparathyroidism: Normal TSH and free T4 in December.  He remains on methimazole.  9.  Disposition: Follow-up in 1 to 2 months or sooner if necessary.  Lonni Meager, NP 04/09/2024, 8:47 AM     [1]  Allergies Allergen Reactions   Penicillin G Anaphylaxis    Other reaction(s): Other (See Comments)  Passed out at age of 44  Other Reaction(s): Syncope   Gemfibrozil  Other (See Comments)    felt like a drunk   "

## 2024-04-09 NOTE — Patient Instructions (Signed)
 Medication Instructions:  Your physician recommends the following medication changes.  START TAKING: Imdur  15 mg (half of 30 mg tab) daily  *If you need a refill on your cardiac medications before your next appointment, please call your pharmacy*  Lab Work: None ordered at this time  If you have labs (blood work) drawn today and your tests are completely normal, you will receive your results only by: MyChart Message (if you have MyChart) OR A paper copy in the mail If you have any lab test that is abnormal or we need to change your treatment, we will call you to review the results.  Follow-Up: At [facility], you and your health needs are our priority.  As part of our continuing mission to provide you with exceptional heart care, our providers are all part of one team.  This team includes your primary Cardiologist (physician) and Advanced Practice Providers or APPs (Physician Assistants and Nurse Practitioners) who all work together to provide you with the care you need, when you need it.  Your next appointment:   2 month(s)  Provider:   You may see Deatrice Cage, MD or Lonni Meager, NP

## 2024-04-09 NOTE — Progress Notes (Signed)
 To be discussed at follow up appointment today.

## 2024-04-20 ENCOUNTER — Ambulatory Visit: Admitting: Cardiovascular Disease

## 2024-06-24 ENCOUNTER — Ambulatory Visit: Admitting: Nurse Practitioner
